# Patient Record
Sex: Female | Born: 1990
Health system: Southern US, Community
[De-identification: ages and names within clinical notes are randomized; demographics above are authoritative.]

## PROBLEM LIST (undated history)

## (undated) ENCOUNTER — Inpatient Hospital Stay (HOSPITAL_COMMUNITY): Payer: Self-pay

## (undated) DIAGNOSIS — L708 Other acne: Secondary | ICD-10-CM

## (undated) DIAGNOSIS — K8020 Calculus of gallbladder without cholecystitis without obstruction: Secondary | ICD-10-CM

## (undated) DIAGNOSIS — B999 Unspecified infectious disease: Secondary | ICD-10-CM

## (undated) DIAGNOSIS — G35 Multiple sclerosis: Secondary | ICD-10-CM

## (undated) DIAGNOSIS — J309 Allergic rhinitis, unspecified: Secondary | ICD-10-CM

## (undated) DIAGNOSIS — G43909 Migraine, unspecified, not intractable, without status migrainosus: Secondary | ICD-10-CM

## (undated) DIAGNOSIS — IMO0002 Reserved for concepts with insufficient information to code with codable children: Secondary | ICD-10-CM

## (undated) DIAGNOSIS — B977 Papillomavirus as the cause of diseases classified elsewhere: Secondary | ICD-10-CM

## (undated) DIAGNOSIS — E669 Obesity, unspecified: Secondary | ICD-10-CM

## (undated) DIAGNOSIS — F32A Depression, unspecified: Secondary | ICD-10-CM

## (undated) DIAGNOSIS — F329 Major depressive disorder, single episode, unspecified: Secondary | ICD-10-CM

## (undated) DIAGNOSIS — R87629 Unspecified abnormal cytological findings in specimens from vagina: Secondary | ICD-10-CM

## (undated) DIAGNOSIS — J45909 Unspecified asthma, uncomplicated: Secondary | ICD-10-CM

## (undated) HISTORY — DX: Papillomavirus as the cause of diseases classified elsewhere: B97.7

## (undated) HISTORY — DX: Unspecified asthma, uncomplicated: J45.909

## (undated) HISTORY — DX: Calculus of gallbladder without cholecystitis without obstruction: K80.20

## (undated) HISTORY — PX: CHOLECYSTECTOMY: SHX55

## (undated) HISTORY — DX: Migraine, unspecified, not intractable, without status migrainosus: G43.909

## (undated) HISTORY — DX: Other acne: L70.8

## (undated) HISTORY — DX: Allergic rhinitis, unspecified: J30.9

## (undated) HISTORY — DX: Depression, unspecified: F32.A

## (undated) HISTORY — DX: Reserved for concepts with insufficient information to code with codable children: IMO0002

## (undated) HISTORY — DX: Major depressive disorder, single episode, unspecified: F32.9

---
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
## 2010-03-09 NOTE — Miscellaneous (Signed)
   Clinical Lists Changes  Problems: Removed problem of COUGH (ICD-786.2) Removed problem of ACUTE ALCOHOLIC INTOXICATION IN REMISSION (ICD-303.03) Removed problem of DIZZINESS (ICD-780.4) Removed problem of TINEA CORPORIS (ICD-110.5) Removed problem of BACK PAIN, LOW (ICD-724.2) Changed problem from ASTHMA, UNSPECIFIED (ICD-493.90) to ASTHMA, INTERMITTENT (ICD-493.90)

## 2010-04-20 LAB — COMPREHENSIVE METABOLIC PANEL
ALT: 9 U/L (ref 0–35)
AST: 16 U/L (ref 0–37)
Alkaline Phosphatase: 64 U/L (ref 39–117)
CO2: 25 mEq/L (ref 19–32)
Chloride: 107 mEq/L (ref 96–112)
GFR calc Af Amer: >60 mL/min (ref >60)
GFR calc non Af Amer: >60 mL/min (ref >60)
Glucose, Bld: 79 mg/dL (ref 70–99)
Potassium: 3.8 mEq/L (ref 3.5–5.1)
Sodium: 141 mEq/L (ref 135–145)
Total Bilirubin: 0.5 mg/dL (ref 0.3–1.2)

## 2010-04-20 LAB — URINALYSIS, ROUTINE W REFLEX MICROSCOPIC
Bilirubin Urine: NEGATIVE
Hgb urine dipstick: NEGATIVE
Ketones, ur: NEGATIVE mg/dL
Nitrite: NEGATIVE
Specific Gravity, Urine: 1.028 (ref 1.005–1.030)
Urobilinogen, UA: 0.2 mg/dL (ref 0.0–1.0)
pH: 6 (ref 5.0–8.0)

## 2010-04-20 LAB — CBC
HCT: 34.8 % — ABNORMAL LOW (ref 36.0–46.0)
Hemoglobin: 12.1 g/dL (ref 12.0–15.0)
MCHC: 34.9 g/dL (ref 30.0–36.0)
RBC: 3.77 MIL/uL — ABNORMAL LOW (ref 3.87–5.11)
WBC: 4.5 10*3/uL (ref 4.0–10.5)

## 2010-04-20 LAB — DIFFERENTIAL
Basophils Relative: 1 % (ref 0–1)
Eosinophils Absolute: 0.2 10*3/uL (ref 0.0–0.7)
Eosinophils Relative: 5 % (ref 0–5)
Neutrophils Relative %: 50 % (ref 43–77)

## 2010-04-20 LAB — LIPASE, BLOOD: Lipase: 32 U/L (ref 23–300)

## 2010-04-20 LAB — PREGNANCY, URINE: Preg Test, Ur: NEGATIVE

## 2010-05-15 LAB — PREGNANCY, URINE: Preg Test, Ur: NEGATIVE

## 2010-05-15 LAB — BASIC METABOLIC PANEL
BUN: 12 mg/dL (ref 6–23)
CO2: 24 mEq/L (ref 19–32)
Glucose, Bld: 95 mg/dL (ref 70–99)
Potassium: 4 mEq/L (ref 3.5–5.1)
Sodium: 145 mEq/L (ref 135–145)

## 2010-05-15 LAB — CBC
HCT: 38.8 % (ref 36.0–49.0)
Hemoglobin: 12.9 g/dL (ref 12.0–16.0)
MCHC: 33.3 g/dL (ref 31.0–37.0)
Platelets: 233 10*3/uL (ref 150–400)
RDW: 12.7 % (ref 11.4–15.5)

## 2010-05-15 LAB — DIFFERENTIAL
Basophils Absolute: 0 10*3/uL (ref 0.0–0.1)
Basophils Relative: 1 % (ref 0–1)
Eosinophils Absolute: 0 10*3/uL (ref 0.0–1.2)
Eosinophils Relative: 1 % (ref 0–5)
Lymphocytes Relative: 15 % — ABNORMAL LOW (ref 24–48)
Monocytes Absolute: 0.6 10*3/uL (ref 0.2–1.2)

## 2010-12-15 ENCOUNTER — Encounter (HOSPITAL_BASED_OUTPATIENT_CLINIC_OR_DEPARTMENT_OTHER): Payer: Self-pay | Admitting: *Deleted

## 2010-12-15 ENCOUNTER — Emergency Department (HOSPITAL_BASED_OUTPATIENT_CLINIC_OR_DEPARTMENT_OTHER)
Admission: EM | Admit: 2010-12-15 | Discharge: 2010-12-16 | Disposition: A | Discharge status: Home / Self Care | Payer: 59 | Attending: Emergency Medicine | Admitting: Emergency Medicine

## 2010-12-15 DIAGNOSIS — J159 Unspecified bacterial pneumonia: Secondary | ICD-10-CM

## 2010-12-15 DIAGNOSIS — J45909 Unspecified asthma, uncomplicated: Secondary | ICD-10-CM | POA: Insufficient documentation

## 2010-12-15 DIAGNOSIS — R05 Cough: Secondary | ICD-10-CM | POA: Insufficient documentation

## 2010-12-15 DIAGNOSIS — R059 Cough, unspecified: Secondary | ICD-10-CM | POA: Insufficient documentation

## 2010-12-15 MED ORDER — ACETAMINOPHEN 325 MG PO TABS
650.0000 mg | ORAL_TABLET | Freq: Once | ORAL | Status: AC
  Administered 2010-12-15: 650 mg via ORAL
  Filled 2010-12-15: qty 2

## 2010-12-15 MED ORDER — ONDANSETRON HCL 4 MG/2ML IJ SOLN
4.0000 mg | Freq: Once | INTRAMUSCULAR | Status: DC
  Filled 2010-12-15: qty 2

## 2010-12-15 MED ORDER — SODIUM CHLORIDE 0.9 % IV SOLN: INTRAVENOUS | Status: DC

## 2010-12-15 MED ORDER — SODIUM CHLORIDE 0.9 % IV BOLUS (SEPSIS): 1000.0000 mL | Freq: Once | INTRAVENOUS | Status: DC

## 2010-12-15 NOTE — ED Notes (Signed)
Pt. Reports she has had a cough and fever today.  Pt. Is in no resp. Distress and has clear and diminished lung snds.

## 2010-12-15 NOTE — ED Notes (Signed)
Chills, fever, sore throat, ear pain and cough x 3 days. Vomiting today.

## 2010-12-16 ENCOUNTER — Emergency Department (INDEPENDENT_AMBULATORY_CARE_PROVIDER_SITE_OTHER): Payer: 59

## 2010-12-16 DIAGNOSIS — R059 Cough, unspecified: Secondary | ICD-10-CM

## 2010-12-16 DIAGNOSIS — R509 Fever, unspecified: Secondary | ICD-10-CM

## 2010-12-16 DIAGNOSIS — R05 Cough: Secondary | ICD-10-CM

## 2010-12-16 DIAGNOSIS — R112 Nausea with vomiting, unspecified: Secondary | ICD-10-CM

## 2010-12-16 LAB — CBC
MCH: 28.7 pg (ref 26.0–34.0)
MCHC: 33.4 g/dL (ref 30.0–36.0)
MCV: 85.9 fL (ref 78.0–100.0)
Platelets: 195 10*3/uL (ref 150–400)
RDW: 12.7 % (ref 11.5–15.5)

## 2010-12-16 LAB — BASIC METABOLIC PANEL
CO2: 22 mEq/L (ref 19–32)
Calcium: 9.4 mg/dL (ref 8.4–10.5)
Creatinine, Ser: 0.6 mg/dL (ref 0.50–1.10)
GFR calc Af Amer: >90 mL/min (ref >90)
GFR calc non Af Amer: >90 mL/min (ref >90)
Sodium: 135 mEq/L (ref 135–145)

## 2010-12-16 MED ORDER — AZITHROMYCIN 250 MG PO TABS: 250.0000 mg | ORAL_TABLET | Freq: Every day | ORAL | Status: AC

## 2010-12-16 MED ORDER — AZITHROMYCIN 250 MG PO TABS
500.0000 mg | ORAL_TABLET | Freq: Once | ORAL | Status: AC
  Administered 2010-12-16: 500 mg via ORAL
  Filled 2010-12-16: qty 2

## 2010-12-16 NOTE — ED Notes (Signed)
Pt. Reports she does not want the IV and IV fluids and does not want the nausea med at this time.  Zofran returned to the external ben and Pt. Was given oral fluids.

## 2010-12-16 NOTE — ED Notes (Signed)
Patient is resting comfortably. 

## 2010-12-16 NOTE — ED Notes (Signed)
Vital signs stable.  Temp orally is 99.4

## 2010-12-16 NOTE — ED Provider Notes (Signed)
History     CSN: [id number] Arrival date & time: 12/15/2010 11:37 PM   First MD Initiated Contact with Patient 12/15/10 2335      Chief Complaint  Patient presents with  . URI    (Consider location/radiation/quality/duration/timing/severity/associated sxs/prior treatment) Patient is a 20 y.o. female presenting with pharyngitis.  Sore Throat This is a new problem. The current episode started more than 2 days ago. The problem occurs constantly. The problem has been gradually worsening. Pertinent negatives include no chest pain, no abdominal pain, no headaches and no shortness of breath. The symptoms are aggravated by nothing. The symptoms are relieved by nothing. She has tried acetaminophen for the symptoms. The treatment provided mild relief.   Patient presents with the complaint of cough and sore throat for 3 days. Vomited once today. Currently not nauseated. Denies diarrhea. She has a history of strep throat in the past. Today is felt as if she had a fever. Also complaint of bilateral ear pain. No primary care provider.   Past Medical History  Diagnosis Date  . LGSIL (low grade squamous intraepithelial dysplasia) 2009  . 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 Asthma   . Gallstones 2011    Past Surgical History  Procedure Date  . Colposcopy 2009  . Cholecystectomy     Family History  Problem Relation Age of Onset  . Multiple sclerosis Mother   . Hypertension Mother   . Diabetes Father   . Breast cancer Maternal Grandmother     History  Substance Use Topics  . Smoking status: Passive Smoker  . Smokeless tobacco: Not on file  . Alcohol Use: No    OB History    Grav Para Term Preterm Abortions TAB SAB Ect Mult Living                  Review of Systems  Constitutional: Positive for fever and chills.  HENT: Positive for ear pain, congestion and sore throat. Negative for neck pain.   Respiratory: Positive for cough. Negative for shortness of breath.   Cardiovascular:  Negative for chest pain.  Gastrointestinal: Positive for nausea and vomiting. Negative for abdominal pain and diarrhea.  Genitourinary: Negative for dysuria and hematuria.  Musculoskeletal: Negative for myalgias and back pain.  Neurological: Negative for headaches.  Hematological: Does not bruise/bleed easily.  Psychiatric/Behavioral: Negative for confusion.    Allergies  Penicillins and Sulfonamide derivatives  Home Medications   Current Outpatient Rx  Name Route Sig Dispense Refill  . ALBUTEROL IN Inhalation Inhale into the lungs.      . DROSPIRENONE-ETHINYL ESTRADIOL 3-0.02 MG PO TABS Oral Take 1 tablet by mouth daily.        BP 131/86  Pulse 126  Temp(Src) 102.1 F (38.9 C) (Oral)  Resp 22  SpO2 99%  Physical Exam  Nursing note and vitals reviewed. Constitutional: She is oriented to person, place, and time. She appears well-developed and well-nourished. No distress.  HENT:  Head: Normocephalic and atraumatic.  Right Ear: External ear normal.  Left Ear: External ear normal.  Mouth/Throat: Oropharynx is clear and moist. No oropharyngeal exudate.       Pharyngeal erythema. No significant tonsillar enlargement no exudate.  Eyes: Conjunctivae and EOM are normal. Pupils are equal, round, and reactive to light.  Neck: Normal range of motion. Neck supple.  Cardiovascular: Normal rate, regular rhythm, normal heart sounds and intact distal pulses.   No murmur heard. Pulmonary/Chest: Effort normal and breath sounds normal. No respiratory distress. She  has no wheezes. She has no rales. She exhibits no tenderness.  Abdominal: Soft. Bowel sounds are normal. There is no tenderness.  Musculoskeletal: Normal range of motion. She exhibits no edema.  Lymphadenopathy:    She has no cervical adenopathy.  Neurological: She is alert and oriented to person, place, and time. No cranial nerve deficit. She exhibits normal muscle tone. Coordination normal.  Skin: Skin is warm and dry. No rash  noted. She is not diaphoretic.    ED Course  Procedures (including critical care time)  Labs Reviewed  BASIC METABOLIC PANEL - Abnormal; Notable for the following:    Glucose, Bld 106 (*)    All other components within normal limits  RAPID STREP SCREEN  CBC   Results for orders placed during the hospital encounter of 12/15/10  RAPID STREP SCREEN      Component Value Range   Streptococcus, Group A Screen (Direct) NEGATIVE  NEGATIVE   CBC      Component Value Range   WBC 5.4  4.0 - 10.5 (K/uL)   RBC 4.39  3.87 - 5.11 (MIL/uL)   Hemoglobin 12.6  12.0 - 15.0 (g/dL)   HCT 40.9  81.1 - 91.4 (%)   MCV 85.9  78.0 - 100.0 (fL)   MCH 28.7  26.0 - 34.0 (pg)   MCHC 33.4  30.0 - 36.0 (g/dL)   RDW 78.2  95.6 - 21.3 (%)   Platelets 195  150 - 400 (K/uL)  BASIC METABOLIC PANEL      Component Value Range   Sodium 135  135 - 145 (mEq/L)   Potassium 3.5  3.5 - 5.1 (mEq/L)   Chloride 102  96 - 112 (mEq/L)   CO2 22  19 - 32 (mEq/L)   Glucose, Bld 106 (*) 70 - 99 (mg/dL)   BUN 9  6 - 23 (mg/dL)   Creatinine, Ser 0.86  0.50 - 1.10 (mg/dL)   Calcium 9.4  8.4 - 57.8 (mg/dL)   GFR calc non Af Amer >90  >90 (mL/min)   GFR calc Af Amer >90  >90 (mL/min)   Dg Chest 2 View  12/16/2010  *RADIOLOGY REPORT*  Clinical Data: Cough, fever, chills, nausea and vomiting.  CHEST - 2 VIEW  Comparison: None.  Findings: The lungs are well-aerated.  Mild medial right basilar airspace opacity is less well characterized on the lateral view but could reflect mild pneumonia.  There is no evidence of pleural effusion or pneumothorax.  The heart is normal in size; the mediastinal contour is within normal limits.  No acute osseous abnormalities are seen.  IMPRESSION: Mild medial right basilar airspace opacity is less well characterized on the lateral view but could reflect mild pneumonia.  Original Report Authenticated By: Tonia Ghent, M.D.       MDM   Patient with upper respiratory flulike symptoms. Negative for  strep pharyngitis. Positive for early pneumonia this would be a community-acquired pneumonia. Patient is nontoxic and in no acute distress. Significant fever is present with temp of 102 upon presentation to the ED. Patient given first dose of Zithromax in the emergency department. Offered the patient IV hydration while here but she did not want that.   Impression:  Community-acquired pneumonia Pharyngitis     Shelda Jakes, MD 12/16/10 [phone]

## 2011-04-10 ENCOUNTER — Encounter (HOSPITAL_BASED_OUTPATIENT_CLINIC_OR_DEPARTMENT_OTHER): Payer: Self-pay | Admitting: *Deleted

## 2011-04-10 ENCOUNTER — Emergency Department (HOSPITAL_BASED_OUTPATIENT_CLINIC_OR_DEPARTMENT_OTHER)
Admission: EM | Admit: 2011-04-10 | Discharge: 2011-04-10 | Disposition: A | Discharge status: Home / Self Care | Payer: 59 | Attending: Emergency Medicine | Admitting: Emergency Medicine

## 2011-04-10 ENCOUNTER — Emergency Department (INDEPENDENT_AMBULATORY_CARE_PROVIDER_SITE_OTHER): Payer: 59

## 2011-04-10 DIAGNOSIS — Z9089 Acquired absence of other organs: Secondary | ICD-10-CM

## 2011-04-10 DIAGNOSIS — Z9889 Other specified postprocedural states: Secondary | ICD-10-CM | POA: Insufficient documentation

## 2011-04-10 DIAGNOSIS — R1013 Epigastric pain: Secondary | ICD-10-CM | POA: Insufficient documentation

## 2011-04-10 DIAGNOSIS — J45909 Unspecified asthma, uncomplicated: Secondary | ICD-10-CM | POA: Insufficient documentation

## 2011-04-10 DIAGNOSIS — R112 Nausea with vomiting, unspecified: Secondary | ICD-10-CM | POA: Insufficient documentation

## 2011-04-10 DIAGNOSIS — R197 Diarrhea, unspecified: Secondary | ICD-10-CM

## 2011-04-10 DIAGNOSIS — R10816 Epigastric abdominal tenderness: Secondary | ICD-10-CM | POA: Insufficient documentation

## 2011-04-10 LAB — COMPREHENSIVE METABOLIC PANEL
ALT: 55 U/L — ABNORMAL HIGH (ref 0–35)
Albumin: 3.9 g/dL (ref 3.5–5.2)
Calcium: 9.6 mg/dL (ref 8.4–10.5)
GFR calc Af Amer: >90 mL/min (ref >90)
Glucose, Bld: 98 mg/dL (ref 70–99)
Potassium: 4 mEq/L (ref 3.5–5.1)
Sodium: 138 mEq/L (ref 135–145)
Total Protein: 8 g/dL (ref 6.0–8.3)

## 2011-04-10 LAB — URINALYSIS, ROUTINE W REFLEX MICROSCOPIC
Bilirubin Urine: NEGATIVE
Ketones, ur: NEGATIVE mg/dL
Nitrite: NEGATIVE
Specific Gravity, Urine: 1.021 (ref 1.005–1.030)
Urobilinogen, UA: 1 mg/dL (ref 0.0–1.0)

## 2011-04-10 LAB — LIPASE, BLOOD: Lipase: 16 U/L (ref 11–59)

## 2011-04-10 MED ORDER — SODIUM CHLORIDE 0.9 % IV BOLUS (SEPSIS)
1000.0000 mL | Freq: Once | INTRAVENOUS | Status: AC
  Administered 2011-04-10: 1000 mL via INTRAVENOUS

## 2011-04-10 MED ORDER — OXYCODONE-ACETAMINOPHEN 5-325 MG PO TABS: 2.0000 | ORAL_TABLET | ORAL | Status: AC | PRN

## 2011-04-10 MED ORDER — ONDANSETRON HCL 4 MG PO TABS: 4.0000 mg | ORAL_TABLET | Freq: Four times a day (QID) | ORAL | Status: AC

## 2011-04-10 MED ORDER — ONDANSETRON 4 MG PO TBDP
4.0000 mg | ORAL_TABLET | Freq: Once | ORAL | Status: AC
  Administered 2011-04-10: 4 mg via ORAL
  Filled 2011-04-10: qty 1

## 2011-04-10 MED ORDER — KETOROLAC TROMETHAMINE 30 MG/ML IJ SOLN
30.0000 mg | Freq: Once | INTRAMUSCULAR | Status: AC
Start: 2011-04-10 — End: 2011-04-10
  Administered 2011-04-10: 30 mg via INTRAVENOUS
  Filled 2011-04-10: qty 1

## 2011-04-10 MED ORDER — ONDANSETRON HCL 4 MG/2ML IJ SOLN
4.0000 mg | Freq: Once | INTRAMUSCULAR | Status: AC
  Administered 2011-04-10: 4 mg via INTRAVENOUS
  Filled 2011-04-10: qty 2

## 2011-04-10 NOTE — ED Provider Notes (Signed)
History     CSN: [id number]  Arrival date & time 04/10/11  0745   First MD Initiated Contact with Patient 04/10/11 [phone]      Chief Complaint  Patient presents with  . Abdominal Pain  . Emesis   patient developed nausea and vomiting. Yesterday morning. Last night, she began having watery diarrhea.` The patient also has diffuse epigastric pain. She states the pain is sometimes worsened with bowel movements. She has had no significant right lower quadrant pain. She does have a previous history of cholecystectomy. Patient has had no sick contacts other than friends with upper respiratory infection). She's had no melena or hematochezia. She's had no hematemesis. No dizziness or syncope. Denies any urinary symptoms. Patient did not take any medications at home for her symptoms. Pain is worsened with movement or palpation. Somewhat relieved with vomiting and with lying still  (Consider location/radiation/quality/duration/timing/severity/associated sxs/prior treatment) HPI  Past Medical History  Diagnosis Date  . LGSIL (low grade squamous intraepithelial dysplasia) 2009  . 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 Asthma   . Gallstones 2011    Past Surgical History  Procedure Date  . Colposcopy 2009  . Cholecystectomy     Family History  Problem Relation Age of Onset  . Multiple sclerosis Mother   . Hypertension Mother   . Diabetes Father   . Breast cancer Maternal Grandmother     History  Substance Use Topics  . Smoking status: Passive Smoker  . Smokeless tobacco: Not on file  . Alcohol Use: No    OB History    Grav Para Term Preterm Abortions TAB SAB Ect Mult Living                  Review of Systems  All other systems reviewed and are negative.    Allergies  Penicillins and Sulfonamide derivatives  Home Medications   Current Outpatient Rx  Name Route Sig Dispense Refill  . ALBUTEROL IN Inhalation Inhale into the lungs.      . DROSPIRENONE-ETHINYL ESTRADIOL 3-0.02 MG  PO TABS Oral Take 1 tablet by mouth daily.        BP 125/87  Pulse 103  Temp(Src) 97.6 F (36.4 C) (Oral)  Resp 20  Ht 5\' 6"  (1.676 m)  SpO2 100%  Physical Exam  Nursing note and vitals reviewed. Constitutional: She is oriented to person, place, and time. She appears well-developed and well-nourished. No distress.  HENT:  Head: Normocephalic and atraumatic.  Eyes: Conjunctivae and EOM are normal. Pupils are equal, round, and reactive to light.  Neck: Neck supple.  Cardiovascular: Normal rate and regular rhythm.  Exam reveals no gallop and no friction rub.   No murmur heard. Pulmonary/Chest: Breath sounds normal. She has no wheezes. She has no rales. She exhibits no tenderness.  Abdominal: Soft. Bowel sounds are normal. She exhibits no distension. There is tenderness. There is no rebound and no guarding.       Mild diffuse epigastric tenderness. Also, mild tenderness to palpation diffusely.  Musculoskeletal: Normal range of motion.  Neurological: She is alert and oriented to person, place, and time. No cranial nerve deficit. Coordination normal.  Skin: Skin is warm and dry. No rash noted.  Psychiatric: She has a normal mood and affect.    ED Course  Procedures (including critical care time)  Labs Reviewed - No data to display No results found.   No diagnosis found.    MDM  Pt is seen and examined;  Initial  history and physical completed.  Will follow.    Vital signs are stable.  Will provide IV hydration, as well as check a comprehensive metabolic panel, urinalysis. Patient is given Zofran and Toradol for her symptoms. We'll reassess. At this time. I feel her symptoms are likely secondary to a viral stomach illness.     1:04 PM  Patient is reassessed. She is feeling better. Urinalysis, initial labs are reassuring. Ultrasound showed no specific abnormality. Offered to provide additional care, hydration, observation or additional antibiotics. However, patient states  she feels better. She wants to go home. Prescriptions and also requesting a work note. She was told to return to ED for any concerns     Theron Arista A. Patrica Duel, MD 04/10/11 1304

## 2011-04-10 NOTE — ED Notes (Signed)
Patient states she developed nausea and vomiting yesterday morning, last night she developed diarrhea.  C/O upper abdominal pain.

## 2011-05-31 ENCOUNTER — Emergency Department (INDEPENDENT_AMBULATORY_CARE_PROVIDER_SITE_OTHER): Payer: 59

## 2011-05-31 ENCOUNTER — Emergency Department (HOSPITAL_BASED_OUTPATIENT_CLINIC_OR_DEPARTMENT_OTHER)
Admission: EM | Admit: 2011-05-31 | Discharge: 2011-05-31 | Disposition: A | Discharge status: Home / Self Care | Payer: 59 | Attending: Emergency Medicine | Admitting: Emergency Medicine

## 2011-05-31 ENCOUNTER — Encounter (HOSPITAL_BASED_OUTPATIENT_CLINIC_OR_DEPARTMENT_OTHER): Payer: Self-pay | Admitting: Emergency Medicine

## 2011-05-31 DIAGNOSIS — S0993XA Unspecified injury of face, initial encounter: Secondary | ICD-10-CM | POA: Insufficient documentation

## 2011-05-31 DIAGNOSIS — S0990XA Unspecified injury of head, initial encounter: Secondary | ICD-10-CM

## 2011-05-31 DIAGNOSIS — Y92009 Unspecified place in unspecified non-institutional (private) residence as the place of occurrence of the external cause: Secondary | ICD-10-CM | POA: Insufficient documentation

## 2011-05-31 DIAGNOSIS — J45909 Unspecified asthma, uncomplicated: Secondary | ICD-10-CM | POA: Insufficient documentation

## 2011-05-31 DIAGNOSIS — W010XXA Fall on same level from slipping, tripping and stumbling without subsequent striking against object, initial encounter: Secondary | ICD-10-CM | POA: Insufficient documentation

## 2011-05-31 DIAGNOSIS — F17200 Nicotine dependence, unspecified, uncomplicated: Secondary | ICD-10-CM | POA: Insufficient documentation

## 2011-05-31 DIAGNOSIS — S199XXA Unspecified injury of neck, initial encounter: Secondary | ICD-10-CM | POA: Insufficient documentation

## 2011-05-31 DIAGNOSIS — R55 Syncope and collapse: Secondary | ICD-10-CM | POA: Insufficient documentation

## 2011-05-31 DIAGNOSIS — X58XXXA Exposure to other specified factors, initial encounter: Secondary | ICD-10-CM

## 2011-05-31 LAB — URINALYSIS, ROUTINE W REFLEX MICROSCOPIC
Bilirubin Urine: NEGATIVE
Glucose, UA: NEGATIVE mg/dL
Ketones, ur: NEGATIVE mg/dL
pH: 7 (ref 5.0–8.0)

## 2011-05-31 NOTE — ED Notes (Signed)
Taking shower, pt does not remember losing consiousness, but states that she woke up on floor, friend with patients states that it took about five minutes before she came to open door, because she had it locked. Pt does not remember falling, just waking up on floor of shower, denies any presyncope symptoms, denies any recent illnesses

## 2011-05-31 NOTE — ED Provider Notes (Addendum)
History     CSN: [id number]  Arrival date & time 05/31/11  0214   First MD Initiated Contact with Patient 05/31/11 0246      Chief Complaint  Patient presents with  . Head Injury    (Consider location/radiation/quality/duration/timing/severity/associated sxs/prior treatment) HPI This is a 21 year old white female who was taking a shower. She either slipped and fell or had a syncopal episode. All she recalls is that she was taking a shower and then found her self on the floor the shower. She is complaining of pain to the right side of her for head and maxillary region. The pain is mild to moderate, worse with palpation. She denies neck or back pain. She denies nausea or vomiting. She did have some transient vaguely described dizziness that has resolved.   Past Medical History  Diagnosis Date  . LGSIL (low grade squamous intraepithelial dysplasia) 2009  . 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 Asthma   . Gallstones 2011    Past Surgical History  Procedure Date  . Colposcopy 2009  . Cholecystectomy     Family History  Problem Relation Age of Onset  . Multiple sclerosis Mother   . Hypertension Mother   . Diabetes Father   . Breast cancer Maternal Grandmother     History  Substance Use Topics  . Smoking status: Current Everyday Smoker -- 0.5 packs/day    Types: Cigarettes  . Smokeless tobacco: Not on file  . Alcohol Use: No    OB History    Grav Para Term Preterm Abortions TAB SAB Ect Mult Living                  Review of Systems  All other systems reviewed and are negative.    Allergies  Eggs or egg-derived products; Penicillins; and Sulfonamide derivatives  Home Medications   Current Outpatient Rx  Name Route Sig Dispense Refill  . ALBUTEROL IN Inhalation Inhale into the lungs.      . DROSPIRENONE-ETHINYL ESTRADIOL 3-0.02 MG PO TABS Oral Take 1 tablet by mouth daily.       BP 108/68  Pulse 79  Temp(Src) 97.8 F (36.6 C) (Oral)  Resp 18  SpO2 100%   LMP 05/17/2011  Physical Exam General: Well-developed, well-nourished female in no acute distress; appearance consistent with age of record HENT: normocephalic, mild right forehead and maxillary tenderness without hematoma or ecchymosis Eyes: pupils equal round and reactive to light; extraocular muscles intact Neck: supple; nontender Heart: regular rate and rhythm Lungs: clear to auscultation bilaterally Abdomen: soft; nondistended; nontender Back: No spinal tenderness Extremities: No deformity; full range of motion Neurologic: Awake, alert and oriented; motor function intact in all extremities and symmetric; no facial droop Skin: Warm and dry Psychiatric: Flat affect    ED Course  Procedures (including critical care time)     MDM   Nursing notes and vitals signs, including pulse oximetry, reviewed.  Summary of this visit's results, reviewed by myself:  Labs:  Results for orders placed during the hospital encounter of 05/31/11  PREGNANCY, URINE      Component Value Range   Preg Test, Ur NEGATIVE  NEGATIVE   URINALYSIS, ROUTINE W REFLEX MICROSCOPIC      Component Value Range   Color, Urine YELLOW  YELLOW    APPearance CLEAR  CLEAR    Specific Gravity, Urine 1.025  1.005 - 1.030    pH 7.0  5.0 - 8.0    Glucose, UA NEGATIVE  NEGATIVE (mg/dL)  Hgb urine dipstick NEGATIVE  NEGATIVE    Bilirubin Urine NEGATIVE  NEGATIVE    Ketones, ur NEGATIVE  NEGATIVE (mg/dL)   Protein, ur NEGATIVE  NEGATIVE (mg/dL)   Urobilinogen, UA 0.2  0.0 - 1.0 (mg/dL)   Nitrite NEGATIVE  NEGATIVE    Leukocytes, UA NEGATIVE  NEGATIVE     Imaging Studies: Ct Head Wo Contrast  05/31/2011  *RADIOLOGY REPORT*  Clinical Data: Head injury to the right side of head.  Possible loss of consciousness.  CT HEAD WITHOUT CONTRAST  Technique:  Contiguous axial images were obtained from the base of the skull through the vertex without contrast.  Comparison: None.  Findings: Ventricles and sulci appear  symmetrical.  No mass effect or midline shift.  No ventricular dilatation.  No abnormal extra- axial fluid collections.  Gray-white matter junctions are distinct. Basal cisterns are not effaced.  No evidence of acute intracranial hemorrhage.  No depressed skull fractures.  Visualized paranasal sinuses and mastoid air cells are not opacified.  IMPRESSION: No acute intracranial abnormalities.  Original Report Authenticated By: Marlon Pel, M.D.      Date: 05/31/2011  Rate: 74Rhythm: normal sinus rhythm  QRS Axis: normal  Intervals: normal  ST/T Wave abnormalities: normal  Conduction Disutrbances: none  Narrative Interpretation: unremarkable  Comparison with prior EKG: Sinus arrhythmia noted previously  Patient was advised that it is unclear if this was a mechanical fall with concussion or syncope. She was advised to followup with her primary care physician for further evaluation.        Hanley Seamen, MD 05/31/11 0454  Hanley Seamen, MD 05/31/11 0981

## 2011-06-25 ENCOUNTER — Emergency Department (HOSPITAL_BASED_OUTPATIENT_CLINIC_OR_DEPARTMENT_OTHER)
Admission: EM | Admit: 2011-06-25 | Discharge: 2011-06-25 | Disposition: A | Discharge status: Home / Self Care | Payer: 59 | Attending: Emergency Medicine | Admitting: Emergency Medicine

## 2011-06-25 ENCOUNTER — Emergency Department (HOSPITAL_BASED_OUTPATIENT_CLINIC_OR_DEPARTMENT_OTHER): Payer: 59

## 2011-06-25 ENCOUNTER — Encounter (HOSPITAL_BASED_OUTPATIENT_CLINIC_OR_DEPARTMENT_OTHER): Payer: Self-pay | Admitting: *Deleted

## 2011-06-25 DIAGNOSIS — S20229A Contusion of unspecified back wall of thorax, initial encounter: Secondary | ICD-10-CM | POA: Insufficient documentation

## 2011-06-25 DIAGNOSIS — T148XXA Other injury of unspecified body region, initial encounter: Secondary | ICD-10-CM

## 2011-06-25 DIAGNOSIS — R209 Unspecified disturbances of skin sensation: Secondary | ICD-10-CM | POA: Insufficient documentation

## 2011-06-25 DIAGNOSIS — J45909 Unspecified asthma, uncomplicated: Secondary | ICD-10-CM | POA: Insufficient documentation

## 2011-06-25 DIAGNOSIS — W19XXXA Unspecified fall, initial encounter: Secondary | ICD-10-CM

## 2011-06-25 DIAGNOSIS — M546 Pain in thoracic spine: Secondary | ICD-10-CM | POA: Insufficient documentation

## 2011-06-25 DIAGNOSIS — W1809XA Striking against other object with subsequent fall, initial encounter: Secondary | ICD-10-CM | POA: Insufficient documentation

## 2011-06-25 DIAGNOSIS — M79609 Pain in unspecified limb: Secondary | ICD-10-CM | POA: Insufficient documentation

## 2011-06-25 MED ORDER — HYDROCODONE-ACETAMINOPHEN 5-325 MG PO TABS: 2.0000 | ORAL_TABLET | ORAL | Status: AC | PRN

## 2011-06-25 NOTE — ED Provider Notes (Signed)
History   This chart was scribed for Nelia Shi, MD scribed by Magnus Sinning. The patient was seen in room MH12/MH12 seen at 21:28.   CSN: [id number]  Arrival date & time 06/25/11  2050   First MD Initiated Contact with Patient 06/25/11 2114      Chief Complaint  Patient presents with  . Fall  . Back Pain    (Consider location/radiation/quality/duration/timing/severity/associated sxs/prior treatment) HPI Tamara Garza is a 21 y.o. female who presents to the Emergency Department complaining of constant moderate back pain with associated right foot numbness and pain. States all sxs are a result of a fall that occurred today. Patient says she fell backwards and landed on a tree root. Pt only notes hx of asthma. No modifying or aggravating factors.  Past Medical History  Diagnosis Date  . 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 Asthma   . Gallstones 2011  . LGSIL (low grade squamous intraepithelial dysplasia)     Past Surgical History  Procedure Date  . Colposcopy 2009  . Cholecystectomy     Family History  Problem Relation Age of Onset  . Multiple sclerosis Mother   . Hypertension Mother   . Diabetes Father   . Breast cancer Maternal Grandmother     History  Substance Use Topics  . Smoking status: Current Everyday Smoker -- 0.5 packs/day    Types: Cigarettes  . Smokeless tobacco: Not on file  . Alcohol Use: No   Review of Systems  All other systems reviewed and are negative.   10 Systems reviewed and are negative for acute change except as noted in the HPI. Allergies  Eggs or egg-derived products; Penicillins; and Sulfonamide derivatives  Home Medications   Current Outpatient Rx  Name Route Sig Dispense Refill  . ALBUTEROL SULFATE HFA 108 (90 BASE) MCG/ACT IN AERS Inhalation Inhale 2 puffs into the lungs every 6 (six) hours as needed.    . DROSPIRENONE-ETHINYL ESTRADIOL 3-0.02 MG PO TABS Oral Take 1 tablet by mouth daily.     Marland Kitchen HYDROCODONE-ACETAMINOPHEN 5-325  MG PO TABS Oral Take 2 tablets by mouth every 4 (four) hours as needed for pain. 15 tablet 0    BP 143/82  Pulse 110  Temp(Src) 98.6 F (37 C) (Oral)  Resp 18  Ht 5\' 6"  (1.676 m)  Wt 200 lb (90.719 kg)  BMI 32.28 kg/m2  SpO2 100%  LMP 06/15/2011  Physical Exam  Nursing note and vitals reviewed. Constitutional: She is oriented to person, place, and time. She appears well-developed and well-nourished. No distress.  HENT:  Head: Normocephalic and atraumatic.  Eyes: Pupils are equal, round, and reactive to light.  Neck: Normal range of motion.  Cardiovascular: Normal rate and intact distal pulses.   Pulmonary/Chest: No respiratory distress.  Abdominal: Normal appearance. She exhibits no distension.  Musculoskeletal: Normal range of motion.       Thoracic back: She exhibits pain.       Back:  Neurological: She is alert and oriented to person, place, and time. No cranial nerve deficit.  Skin: Skin is warm and dry. No rash noted.  Psychiatric: She has a normal mood and affect. Her behavior is normal.    ED Course  Procedures (including critical care time) DIAGNOSTIC STUDIES: Oxygen Saturation is 100% on room air, normal by my interpretation.    COORDINATION OF CARE:  Dg Thoracic Spine 2 View  06/25/2011  *RADIOLOGY REPORT*  Clinical Data: Status post fall, upper back and neck pain.  THORACIC SPINE - 2 VIEW  Comparison: 12/16/2010 chest radiograph  Findings: The imaged vertebral bodies and inter-vertebral disc spaces are maintained. No displaced acute fracture or dislocation identified.   The para-vertebral and overlying soft tissues are within normal limits.  Visualized portions of the lungs are clear. Surgical clips right upper quadrant.  IMPRESSION: No acute osseous abnormality of the thoracic spine.  Original Report Authenticated By: Waneta Martins, M.D.     1. Fall   2. Contusion       MDM  I personally performed the services described in this documentation, which  was scribed in my presence. The recorded information has been reviewed and considered.        Nelia Shi, MD 06/25/11 2218

## 2011-06-25 NOTE — ED Notes (Signed)
Pt was walking downhill on a muddy surface and fell backward, hitting back on tree root.

## 2011-06-25 NOTE — Discharge Instructions (Signed)
Contusion  A contusion is a deep bruise. Contusions happen when an injury causes bleeding under the skin. Signs of bruising include pain, puffiness (swelling), and discolored skin. The contusion may turn blue, purple, or yellow.  HOME CARE    Put ice on the injured area.   Put ice in a plastic bag.   Place a towel between your skin and the bag.   Leave the ice on for 15 to 20 minutes, 3 to 4 times a day.   Only take medicine as told by your doctor.   Rest the injured area.   If possible, raise (elevate) the injured area to lessen puffiness.  GET HELP RIGHT AWAY IF:    You have more bruising or puffiness.   You have pain that is getting worse.   Your puffiness or pain is not helped by medicine.  MAKE SURE YOU:    Understand these instructions.   Will watch your condition.   Will get help right away if you are not doing well or get worse.  Document Released: 07/11/2007 Document Revised: 01/11/2011 Document Reviewed: 11/27/2010  [employer] Patient Information 2012 [employer]

## 2011-07-09 ENCOUNTER — Emergency Department (HOSPITAL_COMMUNITY)
Admission: EM | Admit: 2011-07-09 | Discharge: 2011-07-09 | Disposition: A | Discharge status: Home / Self Care | Payer: 59 | Attending: Emergency Medicine | Admitting: Emergency Medicine

## 2011-07-09 ENCOUNTER — Encounter (HOSPITAL_COMMUNITY): Payer: Self-pay | Admitting: *Deleted

## 2011-07-09 DIAGNOSIS — G5600 Carpal tunnel syndrome, unspecified upper limb: Secondary | ICD-10-CM

## 2011-07-09 DIAGNOSIS — Z91012 Allergy to eggs: Secondary | ICD-10-CM | POA: Insufficient documentation

## 2011-07-09 DIAGNOSIS — F17200 Nicotine dependence, unspecified, uncomplicated: Secondary | ICD-10-CM | POA: Insufficient documentation

## 2011-07-09 DIAGNOSIS — J45909 Unspecified asthma, uncomplicated: Secondary | ICD-10-CM | POA: Insufficient documentation

## 2011-07-09 DIAGNOSIS — Z882 Allergy status to sulfonamides status: Secondary | ICD-10-CM | POA: Insufficient documentation

## 2011-07-09 DIAGNOSIS — Z880 Allergy status to penicillin: Secondary | ICD-10-CM | POA: Insufficient documentation

## 2011-07-09 MED ORDER — HYDROCODONE-ACETAMINOPHEN 5-500 MG PO TABS: 1.0000 | ORAL_TABLET | Freq: Four times a day (QID) | ORAL | Status: AC | PRN

## 2011-07-09 MED ORDER — IBUPROFEN 600 MG PO TABS: 600.0000 mg | ORAL_TABLET | Freq: Four times a day (QID) | ORAL | Status: AC | PRN

## 2011-07-09 NOTE — Discharge Instructions (Signed)
Carpal Tunnel Syndrome The carpal tunnel is a narrow hollow area in the wrist. It is formed by the wrist bones and ligaments. Nerves, blood vessels, and tendons (cord like structures which attach muscle to bone) on the palm side (the side of your hand in the direction your fingers bend) of your hand pass through the carpal tunnel. Repeated wrist motion or certain diseases may cause swelling within the tunnel. (That is why these are called repetitive trauma (damage caused by over use) disorders. It is also a common problem in late pregnancy.) This swelling pinches the main nerve in the wrist (median nerve) and causes the painful condition called carpal tunnel syndrome. A feeling of "pins and needles" may be noticed in the fingers or hand; however, the entire arm may ache from this condition. Carpal tunnel syndrome may clear up by itself. Cortisone injections may help. Sometimes, an operation may be needed to free the pinched nerve. An electromyogram (a type of test) may be needed to confirm this diagnosis (learning what is wrong). This is a test which measures nerve conduction. The nerve conduction is usually slowed in a carpal tunnel syndrome. HOME CARE INSTRUCTIONS   If your caregiver prescribed medication to help reduce swelling, take as directed.   If you were given a splint to keep your wrist from bending, use it as instructed. It is important to wear the splint at night. Use the splint for as long as you have pain or numbness in your hand, arm or wrist. This may take 1 to 2 months.   If you have pain at night, it may help to rub or shake your hand, or elevate your hand above the level of your heart (the center of your chest).   It is important to give your wrist a rest by stopping the activities that are causing the problem. If your symptoms (problems) are work-related, you may need to talk to your employer about changing to a job that does not require using your wrist.   Only take over-the-counter  or prescription medicines for pain, discomfort, or fever as directed by your caregiver.   Following periods of extended use, particularly strenuous use, apply an ice pack wrapped in a towel to the anterior (palm) side of the affected wrist for 20 to 30 minutes. Repeat as needed three to four times per day. This will help reduce the swelling.   Follow all instructions for follow-up with your caregiver. This includes any orthopedic referrals, physical therapy, and rehabilitation. Any delay in obtaining necessary care could result in a delay or failure of your condition to heal.  SEEK IMMEDIATE MEDICAL CARE IF:   You are still having pain and numbness following a week of treatment.   You develop new, unexplained symptoms.   Your current symptoms are getting worse and are not helped or controlled with medications.  MAKE SURE YOU:   Understand these instructions.   Will watch your condition.   Will get help right away if you are not doing well or get worse.  Document Released: 01/20/2000 Document Revised: 01/11/2011 Document Reviewed: 12/08/2010 [facility] Patient Information 2012 [place], [place].Carpal Tunnel Release Carpal tunnel release is done to relieve the pressure on the nerves and tendons on the bottom side of your wrist.  LET YOUR CAREGIVER KNOW ABOUT:   Allergies to food or medicine.   Medicines taken, including vitamins, herbs, eyedrops, over-the-counter medicines, and creams.   Use of steroids (by mouth or creams).   Previous problems with anesthetics or  numbing medicines.   History of bleeding problems or blood clots.   Previous surgery.   Other health problems, including diabetes and kidney problems.   Possibility of pregnancy, if this applies.  RISKS AND COMPLICATIONS  Some problems that may happen after this procedure include:  Infection.   Damage to the nerves, arteries or tendons could occur. This would be very uncommon.   Bleeding.  BEFORE THE PROCEDURE    This surgery may be done while you are asleep (general anesthetic) or may be done under a block where only your forearm and the surgical area is numb.   If the surgery is done under a block, the numbness will gradually wear off within several hours after surgery.  HOME CARE INSTRUCTIONS   Have a responsible person with you for 24 hours.   Do not drive a car or use public transportation for 24 hours.   Only take over-the-counter or prescription medicines for pain, discomfort, or fever as directed by your caregiver. Take them as directed.   You may put ice on the palm side of the affected wrist.   Put ice in a plastic bag.   Place a towel between your skin and the bag.   Leave the ice on for 20 to 30 minutes, 4 times per day.   If you were given a splint to keep your wrist from bending, use it as directed. It is important to wear the splint at night or as directed. Use the splint for as long as you have pain or numbness in your hand, arm, or wrist. This may take 1 to 2 months.   Keep your hand raised (elevated) above the level of your heart as much as possible. This keeps swelling down and helps with discomfort.   Change bandages (dressings) as directed.   Keep the wound clean and dry.  SEEK MEDICAL CARE IF:   You develop pain not relieved with medications.   You develop numbness of your hand.   You develop bleeding from your surgical site.   You have an oral temperature above 102 F (38.9 C).   You develop redness or swelling of the surgical site.   You develop new, unexplained problems.  SEEK IMMEDIATE MEDICAL CARE IF:   You develop a rash.   You have difficulty breathing.   You develop any reaction or side effects to medications given.  Document Released: 04/14/2003 Document Revised: 01/11/2011 Document Reviewed: 11/28/2006 [facility] Patient Information 2012 [place], [place].

## 2011-07-09 NOTE — ED Provider Notes (Signed)
History     CSN: [id number]  Arrival date & time 07/09/11  1802   First MD Initiated Contact with Patient 07/09/11 1828      No chief complaint on file.   (Consider location/radiation/quality/duration/timing/severity/associated sxs/prior treatment) HPI  The emergency department with complaints of left hand and wrist tingling and pain. She states that it started two weeks and ago and has been waxing and waining. She describes the pain as achy, sometimes sharp, sometimes tingling and numbness sensation. No associated trauma, injury, or bite. Pain stops just before elbow. VSS and NAD.  Past Medical History  Diagnosis Date  . 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 Asthma   . Gallstones 2011  . LGSIL (low grade squamous intraepithelial dysplasia)     Past Surgical History  Procedure Date  . Colposcopy 2009  . Cholecystectomy     Family History  Problem Relation Age of Onset  . Multiple sclerosis Mother   . Hypertension Mother   . Diabetes Father   . Breast cancer Maternal Grandmother     History  Substance Use Topics  . Smoking status: Current Everyday Smoker -- 0.5 packs/day    Types: Cigarettes  . Smokeless tobacco: Not on file  . Alcohol Use: No    OB History    Grav Para Term Preterm Abortions TAB SAB Ect Mult Living                  Review of Systems   HEENT: denies blurry vision or change in hearing PULMONARY: Denies difficulty breathing and SOB CARDIAC: denies chest pain or heart palpitations MUSCULOSKELETAL:  denies being unable to ambulate ABDOMEN AL: denies abdominal pain GU: denies loss of bowel or urinary control NEURO: denies numbness and tingling in extremities      Allergies  Eggs or egg-derived products; Sulfonamide derivatives; and Penicillins  Home Medications   Current Outpatient Rx  Name Route Sig Dispense Refill  . ALBUTEROL SULFATE HFA 108 (90 BASE) MCG/ACT IN AERS Inhalation Inhale 2 puffs into the lungs every 6 (six) hours as  needed.    Marland Kitchen HYDROCODONE-ACETAMINOPHEN 5-500 MG PO TABS Oral Take 1 tablet by mouth every 6 (six) hours as needed for pain. 12 tablet 0  . IBUPROFEN 600 MG PO TABS Oral Take 1 tablet (600 mg total) by mouth every 6 (six) hours as needed for pain. 30 tablet 0    LMP 06/11/2011  Physical Exam  Nursing note and vitals reviewed. Constitutional: She appears well-developed and well-nourished. No distress.  HENT:  Head: Normocephalic and atraumatic.  Eyes: Pupils are equal, round, and reactive to light.  Neck: Normal range of motion. Neck supple.  Cardiovascular: Normal rate and regular rhythm.   Pulmonary/Chest: Effort normal.  Abdominal: Soft.  Musculoskeletal:       Left hand: She exhibits decreased range of motion (due to pain) and tenderness. She exhibits no bony tenderness, normal two-point discrimination, normal capillary refill, no deformity, no laceration and no swelling. normal sensation noted. Normal strength noted.       Positive Tinel's sign  Normal color, temperature, skin moist.    Neurological: She is alert.  Skin: Skin is warm and dry.    ED Course  Procedures (including critical care time)  Labs Reviewed - No data to display No results found.   1. Carpal tunnel syndrome       MDM  Patient given wrist splint, Rx for Ibuprofen and Vicodin.  Gave extensive patient education about Carpal tunnel. Pt  given hand referral.  Pt has been advised of the symptoms that warrant their return to the ED. Patient has voiced understanding and has agreed to follow-up with the PCP or specialist.        Dorthula Matas, PA 07/09/11 1921

## 2011-07-09 NOTE — ED Notes (Signed)
Pt reports numbness and pain that radiates from hand to elbow. 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

## 2011-07-10 NOTE — ED Provider Notes (Signed)
Medical screening examination/treatment/procedure(s) were performed by non-physician practitioner and as supervising physician I was immediately available for consultation/collaboration.   Lyanne Co, MD 07/10/11 [phone]

## 2011-08-30 ENCOUNTER — Emergency Department (HOSPITAL_COMMUNITY)
Admission: EM | Admit: 2011-08-30 | Discharge: 2011-08-30 | Disposition: A | Discharge status: Home / Self Care | Payer: 59 | Attending: Emergency Medicine | Admitting: Emergency Medicine

## 2011-08-30 ENCOUNTER — Encounter (HOSPITAL_COMMUNITY): Payer: Self-pay | Admitting: Emergency Medicine

## 2011-08-30 DIAGNOSIS — F17200 Nicotine dependence, unspecified, uncomplicated: Secondary | ICD-10-CM | POA: Insufficient documentation

## 2011-08-30 DIAGNOSIS — M62838 Other muscle spasm: Secondary | ICD-10-CM | POA: Insufficient documentation

## 2011-08-30 DIAGNOSIS — J45909 Unspecified asthma, uncomplicated: Secondary | ICD-10-CM | POA: Insufficient documentation

## 2011-08-30 DIAGNOSIS — M549 Dorsalgia, unspecified: Secondary | ICD-10-CM

## 2011-08-30 MED ORDER — CYCLOBENZAPRINE HCL 10 MG PO TABS: 10.0000 mg | ORAL_TABLET | Freq: Three times a day (TID) | ORAL | Status: AC | PRN

## 2011-08-30 MED ORDER — HYDROCODONE-ACETAMINOPHEN 5-325 MG PO TABS: 1.0000 | ORAL_TABLET | ORAL | Status: AC | PRN

## 2011-08-30 MED ORDER — OXYCODONE-ACETAMINOPHEN 5-325 MG PO TABS
2.0000 | ORAL_TABLET | Freq: Once | ORAL | Status: AC
  Administered 2011-08-30: 2 via ORAL
  Filled 2011-08-30: qty 2

## 2011-08-30 NOTE — ED Notes (Signed)
Pt alert, nad, arrives from home, c/o back pain, onset was several weeks ago, dx with contusion, resp even unlabored, skin pwd, ambulates to triage

## 2011-08-30 NOTE — ED Provider Notes (Signed)
History     CSN: [id number]  Arrival date & time 08/30/11  0309   First MD Initiated Contact with Patient 08/30/11 [phone]      Chief Complaint  Patient presents with  . Back Pain   HPI  History provided by the patient. Patient is a 21 year old female with no significant past medical history who presents with complaints of upper back pain for the past 2 months. Patient reports falling on her back and landing on a hard route while walking down a hill several months ago. Patient was seen in emergency room at that time and had normal x-rays of her back and neck. Since that time she complains of waxing waning pains that seem to be a much worse last evening. Pain radiates some to right upper extremity. She denies any numbness or weakness with symptoms. She does report slight tingling to the tips of fingers. Patient has not been taking any medications recently for her symptoms. She denies having any neck pains.    Past Medical History  Diagnosis Date  . 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 Asthma   . Gallstones 2011  . LGSIL (low grade squamous intraepithelial dysplasia)     Past Surgical History  Procedure Date  . Colposcopy 2009  . Cholecystectomy     Family History  Problem Relation Age of Onset  . Multiple sclerosis Mother   . Hypertension Mother   . Diabetes Father   . Breast cancer Maternal Grandmother     History  Substance Use Topics  . Smoking status: Current Everyday Smoker -- 0.5 packs/day    Types: Cigarettes  . Smokeless tobacco: Not on file  . Alcohol Use: No    OB History    Grav Para Term Preterm Abortions TAB SAB Ect Mult Living                  Review of Systems  Constitutional: Negative for fever, chills and unexpected weight change.  Respiratory: Negative for shortness of breath.     Allergies  Eggs or egg-derived products; Sulfonamide derivatives; and Penicillins  Home Medications   Current Outpatient Rx  Name Route Sig Dispense Refill  .  ACETAMINOPHEN 500 MG PO TABS Oral Take 1,000 mg by mouth every 6 (six) hours as needed. Back pain    . ALBUTEROL SULFATE HFA 108 (90 BASE) MCG/ACT IN AERS Inhalation Inhale 2 puffs into the lungs every 6 (six) hours as needed.      BP 124/75  Pulse 70  Temp 98 F (36.7 C)  Resp 16  SpO2 99%  LMP 08/30/2011  Physical Exam  Nursing note and vitals reviewed. Constitutional: She is oriented to person, place, and time. She appears well-developed and well-nourished. No distress.  HENT:  Head: Normocephalic.  Neck: Normal range of motion. Neck supple.       No cervical midline tenderness  Cardiovascular: Normal rate and regular rhythm.   Pulmonary/Chest: Effort normal and breath sounds normal. No respiratory distress. She has no wheezes. She has no rales.  Abdominal: Soft. There is no tenderness.  Musculoskeletal:       Thoracic back: She exhibits tenderness.       Back:  Neurological: She is alert and oriented to person, place, and time. She has normal strength. No sensory deficit. Gait normal.  Skin: Skin is warm and dry.  Psychiatric: She has a normal mood and affect. Her behavior is normal.    ED Course  Procedures  1. Muscle spasm   2. Back pain       MDM  Patient seen and evaluated. Patient no acute distress. Patient with no red flag symptoms for back pains. Patient had unremarkable x-rays 2 months ago. No new injuries.        Angus Seller, [place] 08/30/11 [phone]

## 2011-08-30 NOTE — ED Provider Notes (Signed)
Medical screening examination/treatment/procedure(s) were performed by non-physician practitioner and as supervising physician I was immediately available for consultation/collaboration.   Carlisle Beers Morene Cecilio, MD 08/30/11 2250

## 2011-10-04 ENCOUNTER — Emergency Department (HOSPITAL_COMMUNITY): Payer: 59

## 2011-10-04 ENCOUNTER — Emergency Department (HOSPITAL_COMMUNITY)
Admission: EM | Admit: 2011-10-04 | Discharge: 2011-10-04 | Disposition: A | Discharge status: Home / Self Care | Payer: 59 | Attending: Emergency Medicine | Admitting: Emergency Medicine

## 2011-10-04 ENCOUNTER — Encounter (HOSPITAL_COMMUNITY): Payer: Self-pay

## 2011-10-04 DIAGNOSIS — J069 Acute upper respiratory infection, unspecified: Secondary | ICD-10-CM

## 2011-10-04 DIAGNOSIS — F17200 Nicotine dependence, unspecified, uncomplicated: Secondary | ICD-10-CM | POA: Insufficient documentation

## 2011-10-04 DIAGNOSIS — J45901 Unspecified asthma with (acute) exacerbation: Secondary | ICD-10-CM

## 2011-10-04 DIAGNOSIS — R0602 Shortness of breath: Secondary | ICD-10-CM | POA: Insufficient documentation

## 2011-10-04 MED ORDER — HYDROCODONE-ACETAMINOPHEN 5-325 MG PO TABS
1.0000 | ORAL_TABLET | Freq: Once | ORAL | Status: AC
  Administered 2011-10-04: 1 via ORAL
  Filled 2011-10-04: qty 1

## 2011-10-04 MED ORDER — PREDNISONE 20 MG PO TABS: 60.0000 mg | ORAL_TABLET | Freq: Every day | ORAL | Status: AC

## 2011-10-04 MED ORDER — ALBUTEROL SULFATE (5 MG/ML) 0.5% IN NEBU
2.5000 mg | INHALATION_SOLUTION | Freq: Once | RESPIRATORY_TRACT | Status: AC
  Administered 2011-10-04: 2.5 mg via RESPIRATORY_TRACT
  Filled 2011-10-04: qty 0.5

## 2011-10-04 MED ORDER — ALBUTEROL SULFATE HFA 108 (90 BASE) MCG/ACT IN AERS: 1.0000 | INHALATION_SPRAY | Freq: Four times a day (QID) | RESPIRATORY_TRACT | Status: DC | PRN

## 2011-10-04 NOTE — ED Provider Notes (Signed)
History     CSN: [id number]  Arrival date & time 10/04/11  4782   First MD Initiated Contact with Patient 10/04/11 1929      Chief Complaint  Patient presents with  . Shortness of Breath     HPI Patient presents to the emergency room with complaints of shortness of breath and wheezing. She has a history of asthma but states she only has attacks very sporadically so she does not take any medications for this on a daily basis. She has had some sore throat recently as well as nasal congestion and cough. This evening she started to feel significant amount of difficulty breathing so she came to the emergency room. Patient is having pain in her chest that is sharp and increases with breathing. Earlier she did feel lightheaded and dizzy as well. She has not had any fevers, vomiting or diarrhea Past Medical History  Diagnosis Date  . 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 Asthma   . Gallstones 2011  . LGSIL (low grade squamous intraepithelial dysplasia)     Past Surgical History  Procedure Date  . Colposcopy 2009  . Cholecystectomy     Family History  Problem Relation Age of Onset  . Multiple sclerosis Mother   . Hypertension Mother   . Diabetes Father   . Breast cancer Maternal Grandmother     History  Substance Use Topics  . Smoking status: Current Everyday Smoker -- 0.5 packs/day    Types: Cigarettes  . Smokeless tobacco: Not on file  . Alcohol Use: No    OB History    Grav Para Term Preterm Abortions TAB SAB Ect Mult Living                  Review of Systems  All other systems reviewed and are negative.    Allergies  Eggs or egg-derived products; Penicillins; and Sulfonamide derivatives  Home Medications   Current Outpatient Rx  Name Route Sig Dispense Refill  . HYDROCODONE-ACETAMINOPHEN 5-325 MG PO TABS Oral Take 0.5 tablets by mouth every 6 (six) hours as needed. Back pain      BP 165/91  Pulse 111  Temp 99.3 F (37.4 C) (Oral)  Resp 15  SpO2  98%  Physical Exam  Nursing note and vitals reviewed. Constitutional: She appears well-developed and well-nourished. No distress.  HENT:  Head: Normocephalic and atraumatic.  Right Ear: External ear normal.  Left Ear: External ear normal.  Mouth/Throat: No oropharyngeal exudate (mild erythema).  Eyes: Conjunctivae are normal. Right eye exhibits no discharge. Left eye exhibits no discharge. No scleral icterus.  Neck: Neck supple. No tracheal deviation present.  Cardiovascular: Normal rate, regular rhythm and intact distal pulses.   Pulmonary/Chest: Effort normal. No stridor. No respiratory distress. She has no wheezes (no wheezing after breathing treatment in the ED). She has no rales.  Abdominal: Soft. Bowel sounds are normal. She exhibits no distension. There is no tenderness. There is no rebound and no guarding.  Musculoskeletal: She exhibits no edema and no tenderness.  Neurological: She is alert. She has normal strength. No sensory deficit. Cranial nerve deficit:  no gross defecits noted. She exhibits normal muscle tone. She displays no seizure activity. Coordination normal.  Skin: Skin is warm and dry. No rash noted.  Psychiatric: She has a normal mood and affect.    ED Course  Procedures (including critical care time)   Labs Reviewed  RAPID STREP SCREEN   Dg Chest 2 View  10/04/2011  *  RADIOLOGY REPORT*  Clinical Data: Cough.  Asthma.  CHEST - 2 VIEW  Comparison: Plain films of the chest 12/16/2010.  Findings: Lungs are clear.  Heart size is normal.  No pneumothorax or pleural fluid.  No focal bony abnormality.  IMPRESSION: No acute disease.   Original Report Authenticated By: Bernadene Bell. D'ALESSIO, M.D.       MDM  Patient most likely has a viral upper respiratory infection triggering her asthma attack. She has no evidence of wheezing in the emergency department after her one treatment. I will discharge her home on an inhaler. We'll give her a short course of steroids. I  recommended to followup with primary care Dr. for further treatment. I did recommend that she discontinue smoking cigarettes        Celene Kras, MD 10/04/11 2144

## 2011-10-04 NOTE — ED Notes (Signed)
MD at bedside. 

## 2011-10-04 NOTE — ED Notes (Signed)
C/o sob, hx of asthma, sat 100% RA, HR 111, wheezing breath sound noted

## 2011-11-10 ENCOUNTER — Encounter: Payer: Self-pay | Admitting: Internal Medicine

## 2011-11-10 DIAGNOSIS — B977 Papillomavirus as the cause of diseases classified elsewhere: Secondary | ICD-10-CM | POA: Insufficient documentation

## 2011-11-10 DIAGNOSIS — R87612 Low grade squamous intraepithelial lesion on cytologic smear of cervix (LGSIL): Secondary | ICD-10-CM | POA: Insufficient documentation

## 2011-11-10 DIAGNOSIS — Z3689 Encounter for other specified antenatal screening: Secondary | ICD-10-CM | POA: Insufficient documentation

## 2011-11-10 DIAGNOSIS — K8020 Calculus of gallbladder without cholecystitis without obstruction: Secondary | ICD-10-CM | POA: Insufficient documentation

## 2011-11-21 ENCOUNTER — Ambulatory Visit (INDEPENDENT_AMBULATORY_CARE_PROVIDER_SITE_OTHER)
Admission: RE | Admit: 2011-11-21 | Discharge: 2011-11-21 | Disposition: A | Discharge status: Home / Self Care | Payer: 59 | Source: Ambulatory Visit | Attending: Internal Medicine | Admitting: Internal Medicine

## 2011-11-21 ENCOUNTER — Encounter: Payer: Self-pay | Admitting: Internal Medicine

## 2011-11-21 ENCOUNTER — Other Ambulatory Visit (INDEPENDENT_AMBULATORY_CARE_PROVIDER_SITE_OTHER): Payer: 59

## 2011-11-21 ENCOUNTER — Ambulatory Visit (INDEPENDENT_AMBULATORY_CARE_PROVIDER_SITE_OTHER): Payer: 59 | Admitting: Internal Medicine

## 2011-11-21 VITALS — BP 94/60 | HR 75 | Temp 98.0°F | Ht 66.0 in | Wt 227.4 lb

## 2011-11-21 DIAGNOSIS — G5602 Carpal tunnel syndrome, left upper limb: Secondary | ICD-10-CM

## 2011-11-21 DIAGNOSIS — Z0000 Encounter for general adult medical examination without abnormal findings: Secondary | ICD-10-CM

## 2011-11-21 DIAGNOSIS — G5600 Carpal tunnel syndrome, unspecified upper limb: Secondary | ICD-10-CM

## 2011-11-21 DIAGNOSIS — M549 Dorsalgia, unspecified: Secondary | ICD-10-CM

## 2011-11-21 DIAGNOSIS — N6320 Unspecified lump in the left breast, unspecified quadrant: Secondary | ICD-10-CM | POA: Insufficient documentation

## 2011-11-21 DIAGNOSIS — G43909 Migraine, unspecified, not intractable, without status migrainosus: Secondary | ICD-10-CM | POA: Insufficient documentation

## 2011-11-21 DIAGNOSIS — N63 Unspecified lump in unspecified breast: Secondary | ICD-10-CM

## 2011-11-21 HISTORY — DX: Migraine, unspecified, not intractable, without status migrainosus: G43.909

## 2011-11-21 LAB — CBC WITH DIFFERENTIAL/PLATELET
Basophils Absolute: 0 10*3/uL (ref 0.0–0.1)
Eosinophils Absolute: 0.3 10*3/uL (ref 0.0–0.7)
HCT: 37.6 % (ref 36.0–46.0)
Hemoglobin: 12.3 g/dL (ref 12.0–15.0)
Lymphocytes Relative: 32 % (ref 12.0–46.0)
Lymphs Abs: 1.7 10*3/uL (ref 0.7–4.0)
MCHC: 32.7 g/dL (ref 30.0–36.0)
Neutro Abs: 2.9 10*3/uL (ref 1.4–7.7)
Platelets: 223 10*3/uL (ref 150.0–400.0)
RDW: 13.9 % (ref 11.5–14.6)

## 2011-11-21 LAB — HEPATIC FUNCTION PANEL
AST: 16 U/L (ref 0–37)
Alkaline Phosphatase: 68 U/L (ref 39–117)
Bilirubin, Direct: 0.1 mg/dL (ref 0.0–0.3)
Total Protein: 7.3 g/dL (ref 6.0–8.3)

## 2011-11-21 LAB — LIPID PANEL
HDL: 48.5 mg/dL (ref >39.00)
Total CHOL/HDL Ratio: 3

## 2011-11-21 LAB — BASIC METABOLIC PANEL
CO2: 26 mEq/L (ref 19–32)
Calcium: 9.2 mg/dL (ref 8.4–10.5)
Potassium: 4.2 mEq/L (ref 3.5–5.1)
Sodium: 138 mEq/L (ref 135–145)

## 2011-11-21 LAB — TSH: TSH: 0.7 u[IU]/mL (ref 0.35–5.50)

## 2011-11-21 MED ORDER — ALBUTEROL SULFATE HFA 108 (90 BASE) MCG/ACT IN AERS: 1.0000 | INHALATION_SPRAY | Freq: Four times a day (QID) | RESPIRATORY_TRACT | Status: DC | PRN

## 2011-11-21 MED ORDER — NABUMETONE 500 MG PO TABS: 500.0000 mg | ORAL_TABLET | Freq: Two times a day (BID) | ORAL | Status: DC | PRN

## 2011-11-21 NOTE — Patient Instructions (Addendum)
Take all new medications as prescribed   - the pain medicine Continue all other medications as before - your inhaler was refilled as well Please have the pharmacy call with any other refills you may need. Please go to XRAY in the Basement for the x-ray test Please go to LAB in the Basement for the blood and/or urine tests to be done today You will be contacted by phone if any changes need to be made immediately.  Otherwise, you will receive a letter about your results with an explanation. Thank you for enrolling in MyChart. Please follow the instructions below to securely access your online medical record. MyChart allows you to send messages to your doctor, view your test results, renew your prescriptions, schedule appointments, and more. You will be contacted regarding the referral for: hand surgury for the left wrist, and the Diagnostic mammogram Please keep your appointments with your specialists as you have planned - your yearly exam with GYN Please return in 1 year for your yearly visit, or sooner if needed

## 2011-11-21 NOTE — Progress Notes (Signed)
Subjective:    Patient ID: Tamara Garza, female    DOB: 1990-10-17, 20 y.o.   MRN: [id number]    HPI  Here for wellness and to establish as new pt;  Overall doing ok;  Pt denies CP, worsening SOB, DOE, wheezing, orthopnea, PND, worsening LE edema, palpitations, dizziness or syncope.  Pt denies neurological change such as new Headache, facial or extremity weakness.  Pt denies polydipsia, polyuria, or low sugar symptoms. Pt states overall good compliance with treatment and medications, good tolerability, and trying to follow lower cholesterol diet.  Pt denies worsening depressive symptoms, suicidal ideation or panic. No fever, wt loss, night sweats, loss of appetite, or other constitutional symptoms.  Pt states good ability with ADL's, low fall risk, home safety reviewed and adequate, no significant changes in hearing or vision, and occasionally active with exercise.  Fell walking down a hill with tripping 2 mo ago, seen in ER ([facility]) and tx with pain med, as suggested to see chiropracter but did not go.   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 Mild weakness, and ? Cramping, somewhat better with muscle relaxer.Marland Kitchen Left handed, and handwriting is poor when this happens.  Better with less hand use.    Also with a tender rather large mass to the left breast for 2 mo without overlying skin change, rash or nipple d/c Past Medical History  Diagnosis Date  . 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 Asthma   . Gallstones 2011  . LGSIL (low grade squamous intraepithelial dysplasia)   . RHINITIS, ALLERGIC 04/04/2006    Qualifier: Diagnosis of  By: Abundio Miu    . ASTHMA, INTERMITTENT 04/04/2006    Qualifier: Diagnosis of  By: Lelon Perla MD, Vickki Muff    . ACNE 04/04/2006    Qualifier: Diagnosis of  By: Abundio Miu    . Migraine 11/21/2011   Past Surgical History  Procedure  Date  . Colposcopy 2009  . Cholecystectomy     reports that she has been smoking Cigarettes.  She has been smoking about .5 packs per day. She has never used smokeless tobacco. She reports that she does not drink alcohol or use illicit drugs. family history includes Breast cancer in her maternal grandmother; Diabetes in her father; Hypertension in her mother; and Multiple sclerosis in her mother. Allergies  Allergen Reactions  . Eggs Or Egg-Derived Products Swelling  . Penicillins Rash  . Sulfonamide Derivatives Rash   No current outpatient prescriptions on file prior to visit.   Review of Systems Review of Systems  Constitutional: Negative for diaphoresis, activity change, appetite change and unexpected weight change.  HENT: Negative for hearing loss, ear pain, facial swelling, mouth sores and neck stiffness.   Eyes: Negative for pain, redness and visual disturbance.  Respiratory: Negative for shortness of breath and wheezing.   Cardiovascular: Negative for chest pain and palpitations.  Gastrointestinal: Negative for diarrhea, blood in stool, abdominal distention and rectal pain.  Genitourinary: Negative for hematuria, flank pain and decreased urine volume.  Musculoskeletal: Negative for myalgias and joint swelling.  Skin: Negative for color change and wound.  Neurological: Negative for syncope and numbness.  Hematological: Negative for adenopathy.  Psychiatric/Behavioral: Negative for hallucinations, self-injury, decreased concentration and agitation.      Objective:   Physical Exam BP 94/60  Pulse 75  Temp 98 F (36.7 C) (Oral)  Ht 5\' 6"  (1.676 m)  Wt 227 lb 6 oz (103.137 kg)  BMI 36.70 kg/m2  SpO2 97%  LMP 11/05/2011 Physical Exam  VS noted Constitutional: Pt is oriented to person, place, and time. Appears well-developed and well-nourished.  HENT:  Head: Normocephalic and atraumatic.  Right Ear: External ear normal.  Left Ear: External ear normal.  Nose: Nose  normal.  Mouth/Throat: Oropharynx is clear and moist.  Left breast with nondiscrete subq ? Golf ball sized tender mass without overlying skin change or drainage, no nipple d/c Right breast neg for abnormal Eyes: Conjunctivae and EOM are normal. Pupils are equal, round, and reactive to light.  Neck: Normal range of motion. Neck supple. No JVD present. No tracheal deviation present.  Cardiovascular: Normal rate, regular rhythm, normal heart sounds and intact distal pulses.   Pulmonary/Chest: Effort normal and breath sounds normal.  Abdominal: Soft. Bowel sounds are normal. There is no tenderness.  Musculoskeletal: Normal range of motion. Exhibits no edema.  Lymphadenopathy:  Has no cervical adenopathy.  Spine: nontender Neurological: Pt is alert and oriented to person, place, and time. Pt has normal reflexes. No cranial nerve deficit. Motor/sens/dtr/gait intact Skin: Skin is warm and dry. No rash noted.  Psychiatric:  Has  normal mood and affect. Behavior is normal.     Assessment & Plan:

## 2011-11-21 NOTE — Assessment & Plan Note (Signed)

## 2011-11-22 ENCOUNTER — Telehealth: Payer: Self-pay

## 2011-11-22 DIAGNOSIS — N6320 Unspecified lump in the left breast, unspecified quadrant: Secondary | ICD-10-CM

## 2011-11-22 NOTE — Telephone Encounter (Signed)
Yes please order a [place]

## 2011-11-22 NOTE — Telephone Encounter (Signed)
Done per emr 

## 2011-11-22 NOTE — Telephone Encounter (Signed)
Message copied by Pincus Sanes on Thu Nov 22, 2011  4:01 PM ------      Message from: Corwin Levins      Created: Thu Nov 22, 2011 12:38 PM       Ok - do I need to change the order for just an ultrasound?      ----- Message -----         From: Vladimir Crofts Enoc Getter         Sent: 11/22/2011  10:20 AM           To: Corwin Levins, MD            [facility] to inform the patient is too young to do a mammogram.  But they can do US of the left breast

## 2011-11-25 ENCOUNTER — Encounter: Payer: Self-pay | Admitting: Internal Medicine

## 2011-11-25 NOTE — Assessment & Plan Note (Signed)
Mild to mod symptoms, for hand surgury referral

## 2011-11-25 NOTE — Assessment & Plan Note (Signed)
Suspect fairly large breast cyst with pain, for u/s, consider needle drainage

## 2011-11-25 NOTE — Assessment & Plan Note (Signed)
Lower back, no trauma but as she is young, no prior imaging, so will check lumbar films to r/o unusual spine rheum dz

## 2011-12-03 ENCOUNTER — Ambulatory Visit
Admission: RE | Admit: 2011-12-03 | Discharge: 2011-12-03 | Disposition: A | Discharge status: Home / Self Care | Payer: 59 | Source: Ambulatory Visit | Attending: Internal Medicine | Admitting: Internal Medicine

## 2011-12-03 DIAGNOSIS — N6320 Unspecified lump in the left breast, unspecified quadrant: Secondary | ICD-10-CM

## 2011-12-06 ENCOUNTER — Ambulatory Visit: Payer: 59

## 2012-03-22 ENCOUNTER — Other Ambulatory Visit: Payer: Self-pay

## 2012-04-16 ENCOUNTER — Encounter (HOSPITAL_COMMUNITY): Payer: Self-pay | Admitting: Emergency Medicine

## 2012-04-16 ENCOUNTER — Emergency Department (HOSPITAL_COMMUNITY)
Admission: EM | Admit: 2012-04-16 | Discharge: 2012-04-16 | Disposition: A | Discharge status: Home / Self Care | Payer: 59 | Attending: Emergency Medicine | Admitting: Emergency Medicine

## 2012-04-16 ENCOUNTER — Emergency Department (HOSPITAL_COMMUNITY): Payer: 59

## 2012-04-16 DIAGNOSIS — F17200 Nicotine dependence, unspecified, uncomplicated: Secondary | ICD-10-CM | POA: Insufficient documentation

## 2012-04-16 DIAGNOSIS — R059 Cough, unspecified: Secondary | ICD-10-CM | POA: Insufficient documentation

## 2012-04-16 DIAGNOSIS — R0602 Shortness of breath: Secondary | ICD-10-CM | POA: Insufficient documentation

## 2012-04-16 DIAGNOSIS — J399 Disease of upper respiratory tract, unspecified: Secondary | ICD-10-CM

## 2012-04-16 DIAGNOSIS — R071 Chest pain on breathing: Secondary | ICD-10-CM | POA: Insufficient documentation

## 2012-04-16 DIAGNOSIS — Z8619 Personal history of other infectious and parasitic diseases: Secondary | ICD-10-CM | POA: Insufficient documentation

## 2012-04-16 DIAGNOSIS — Z8719 Personal history of other diseases of the digestive system: Secondary | ICD-10-CM | POA: Insufficient documentation

## 2012-04-16 DIAGNOSIS — Z872 Personal history of diseases of the skin and subcutaneous tissue: Secondary | ICD-10-CM | POA: Insufficient documentation

## 2012-04-16 DIAGNOSIS — Z8679 Personal history of other diseases of the circulatory system: Secondary | ICD-10-CM | POA: Insufficient documentation

## 2012-04-16 DIAGNOSIS — J45909 Unspecified asthma, uncomplicated: Secondary | ICD-10-CM | POA: Insufficient documentation

## 2012-04-16 DIAGNOSIS — J029 Acute pharyngitis, unspecified: Secondary | ICD-10-CM | POA: Insufficient documentation

## 2012-04-16 DIAGNOSIS — R062 Wheezing: Secondary | ICD-10-CM | POA: Insufficient documentation

## 2012-04-16 DIAGNOSIS — Z79899 Other long term (current) drug therapy: Secondary | ICD-10-CM | POA: Insufficient documentation

## 2012-04-16 DIAGNOSIS — J3489 Other specified disorders of nose and nasal sinuses: Secondary | ICD-10-CM | POA: Insufficient documentation

## 2012-04-16 DIAGNOSIS — J069 Acute upper respiratory infection, unspecified: Secondary | ICD-10-CM | POA: Insufficient documentation

## 2012-04-16 DIAGNOSIS — R05 Cough: Secondary | ICD-10-CM | POA: Insufficient documentation

## 2012-04-16 MED ORDER — IPRATROPIUM BROMIDE 0.02 % IN SOLN
0.5000 mg | Freq: Once | RESPIRATORY_TRACT | Status: AC
Start: 2012-04-16 — End: 2012-04-16
  Administered 2012-04-16: 0.5 mg via RESPIRATORY_TRACT
  Filled 2012-04-16: qty 2.5

## 2012-04-16 MED ORDER — ACETAMINOPHEN 325 MG PO TABS
650.0000 mg | ORAL_TABLET | Freq: Once | ORAL | Status: AC
  Administered 2012-04-16: 650 mg via ORAL
  Filled 2012-04-16: qty 2

## 2012-04-16 MED ORDER — ALBUTEROL SULFATE (5 MG/ML) 0.5% IN NEBU
5.0000 mg | INHALATION_SOLUTION | Freq: Once | RESPIRATORY_TRACT | Status: AC
  Administered 2012-04-16: 5 mg via RESPIRATORY_TRACT
  Filled 2012-04-16: qty 1

## 2012-04-16 NOTE — ED Provider Notes (Signed)
History    This chart was scribed for Glade Nurse PA-C a non-physician practitioner working with Flint Melter, MD by Lewanda Rife, ED Scribe. This patient was seen in room WTR6/WTR6 and the patient's care was started at 2132.    CSN: [id number]  Arrival date & time 04/16/12  1805   First MD Initiated Contact with Patient 04/16/12 2114      Chief Complaint  Patient presents with  . Cough  . Sore Throat  . Chest Pain    (Consider location/radiation/quality/duration/timing/severity/associated sxs/prior treatment) HPI Tamara Garza is a 22 y.o. female who presents to the Emergency Department complaining of constant non-radiating moderate chest wall pain onset 2 days. (Pt clarified chest pain from earlier triage complaint as not cardiac in nature). Pt states pain is 7/10 in severity. Pt reports sore throat, cough, rhinorrhea, SOB, "itchy ears", and subjective fever. Pt denies dysuria, and otalgia. Pt reports using inhaler today and OTC sinus medications with no relief of symptoms. Pt reports hx of asthma.   Past Medical History  Diagnosis Date  . 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 Asthma   . Gallstones 2011  . LGSIL (low grade squamous intraepithelial dysplasia)   . RHINITIS, ALLERGIC 04/04/2006    Qualifier: Diagnosis of  By: Abundio Miu    . ASTHMA, INTERMITTENT 04/04/2006    Qualifier: Diagnosis of  By: Lelon Perla MD, Vickki Muff    . ACNE 04/04/2006    Qualifier: Diagnosis of  By: Abundio Miu    . Migraine 11/21/2011    Past Surgical History  Procedure Laterality Date  . Colposcopy  2009  . Cholecystectomy      Family History  Problem Relation Age of Onset  . Multiple sclerosis Mother   . Hypertension Mother   . Diabetes Father   . Breast cancer Maternal Grandmother     History  Substance Use Topics  . Smoking status: Current Every Day Smoker -- 0.50 packs/day    Types: Cigarettes  . Smokeless tobacco: Never Used  . Alcohol Use: No    OB History    Grav Para Term Preterm Abortions TAB SAB Ect Mult Living                  Review of Systems  Constitutional: Positive for fever. Negative for diaphoresis.  HENT: Positive for sore throat and rhinorrhea. Negative for neck pain and neck stiffness.   Eyes: Negative for visual disturbance.  Respiratory: Positive for cough and wheezing. Negative for apnea, chest tightness and shortness of breath.   Cardiovascular: Positive for chest pain. Negative for palpitations.  Gastrointestinal: Negative for nausea, vomiting, diarrhea and constipation.  Genitourinary: Negative for dysuria.  Musculoskeletal: Negative for gait problem.  Skin: Negative for rash.  Neurological: Negative for dizziness, weakness, light-headedness, numbness and headaches.   A complete 10 system review of systems was obtained and all systems are negative except as noted in the HPI and PMH.    Allergies  Eggs or egg-derived products; Penicillins; and Sulfonamide derivatives  Home Medications   Current Outpatient Rx  Name  Route  Sig  Dispense  Refill  . albuterol (PROVENTIL HFA;VENTOLIN HFA) 108 (90 BASE) MCG/ACT inhaler   Inhalation   Inhale 1-2 puffs into the lungs every 6 (six) hours as needed for wheezing.   1 Inhaler   2     BP 125/82  Pulse 96  Temp(Src) 100.1 F (37.8 C) (Oral)  Resp 20  SpO2 100%  LMP 03/19/2012  Physical Exam  Nursing note and vitals reviewed. Constitutional: She is oriented to person, place, and time. She appears well-developed and well-nourished. No distress.  HENT:  Head: Normocephalic and atraumatic.  Right Ear: Tympanic membrane normal. Tympanic membrane is not erythematous, not retracted and not bulging.  Left Ear: Tympanic membrane normal. Tympanic membrane is not erythematous, not retracted and not bulging.  Mouth/Throat: Uvula is midline and mucous membranes are normal. Posterior oropharyngeal erythema present. No oropharyngeal exudate or posterior oropharyngeal edema.   Cerumen impacted bilaterally   Eyes: Conjunctivae and EOM are normal.  Neck: Normal range of motion. Neck supple.  No meningeal signs  Cardiovascular: Normal rate, regular rhythm and normal heart sounds.  Exam reveals no gallop and no friction rub.   No murmur heard. Pulmonary/Chest: Effort normal. No respiratory distress. She has wheezes (mild expiratory ). She has no rales. She exhibits no tenderness.  Abdominal: Soft. She exhibits no distension. There is no tenderness.  Musculoskeletal: Normal range of motion. She exhibits no edema and no tenderness.  Neurological: She is alert and oriented to person, place, and time. No cranial nerve deficit.  Skin: Skin is warm and dry. She is not diaphoretic. No erythema.  Psychiatric: She has a normal mood and affect. Her behavior is normal.    ED Course  Procedures (including critical care time) Medications  acetaminophen (TYLENOL) tablet 650 mg (650 mg Oral Given 04/16/12 2227)  albuterol (PROVENTIL) (5 MG/ML) 0.5% nebulizer solution 5 mg (5 mg Nebulization Given 04/16/12 2155)  ipratropium (ATROVENT) nebulizer solution 0.5 mg (0.5 mg Nebulization Given 04/16/12 2155)   Date: 04/05/2012  Rate: 95  Rhythm: sinus rhythm  QRS Axis: normal  Intervals: normal  ST/T Wave abnormalities: normal  Conduction Disutrbances: none  Narrative Interpretation: Normal EKG  Old EKG Reviewed: None available   Labs Reviewed  RAPID STREP SCREEN   Results for orders placed during the hospital encounter of 04/16/12  RAPID STREP SCREEN      Result Value Range   Streptococcus, Group A Screen (Direct) NEGATIVE  NEGATIVE    Dg Chest 2 View  04/16/2012  *RADIOLOGY REPORT*  Clinical Data: Short of breath, fever  CHEST - 2 VIEW  Comparison: Prior chest x-ray 10/04/2011  Findings: The lungs are well-aerated and free from pulmonary edema, focal airspace consolidation or pulmonary nodule.  Cardiac and mediastinal contours are within normal limits.  No pneumothorax, or  pleural effusion. No acute osseous findings.  IMPRESSION:  No acute cardiopulmonary disease.   Original Report Authenticated By: Malachy Moan, M.D.    I personally viewed the xray.  1. Upper respiratory disease       MDM  Rapid strep negative. On initital exam, pt not compliant when asked to take deep breaths. Slight expiratory wheezes heard bilaterally, but diff to assess. With hx of asthma, will try neb tx and re-evaluate.  On re-eval, pt able to take deeper breaths, wheeze has resolved, but now hearing course lung sounds. Poss pneumonia vs URI. Will get CXR.   Pt CXR negative for acute infiltrate. Patients symptoms are consistent with URI, likely viral etiology. Discussed that antibiotics are not indicated for viral infections. Pt will be discharged with symptomatic treatment.  Verbalizes understanding and is agreeable with plan. Pt is hemodynamically stable & in NAD prior to dc.   I personally performed the services described in this documentation, which was scribed in my presence. The recorded information has been reviewed and is accurate.    Glade Nurse, PA-C 04/18/12 0004

## 2012-04-16 NOTE — ED Notes (Signed)
RT called for second neb tx

## 2012-04-16 NOTE — ED Notes (Signed)
Pt c/o cough, sore throat and chest pain.  States these sx started 2 days ago.  States that she started coughing up clear/yellow mucus today.

## 2012-04-16 NOTE — ED Notes (Signed)
Pt ambulatory to exam room with limp, but independent gait. Pt has swelling and redness to medial side of L foot. States pain is in his heel when he walks.

## 2012-04-16 NOTE — ED Notes (Signed)
Received call from RN for repeat nebulizer treatment. Called to ICU for an emergency. Immediately called RN in ED to inform her I was not able to come there at this time and to inform her that there is no pending order for a repeat treatment. RN agrees to administer second treatment to the patient.

## 2012-04-16 NOTE — ED Notes (Signed)
Pt ambulatory to exam room with steady gait. Pt states she has had a productive cough since yesterday. Also c/o sore throat. Pt states she took some OTC sinus meds, but they are not working. Pt arrives with companion.

## 2012-04-16 NOTE — ED Notes (Signed)
Patient transported to X-ray 

## 2012-04-18 NOTE — ED Provider Notes (Signed)
Medical screening examination/treatment/procedure(s) were performed by non-physician practitioner and as supervising physician I was immediately available for consultation/collaboration.  Flint Melter, MD 04/18/12 [phone]

## 2012-04-24 ENCOUNTER — Ambulatory Visit: Payer: 59 | Admitting: Internal Medicine

## 2012-10-15 ENCOUNTER — Ambulatory Visit: Payer: 59 | Admitting: Internal Medicine

## 2012-12-11 ENCOUNTER — Other Ambulatory Visit: Payer: Self-pay

## 2013-01-05 ENCOUNTER — Emergency Department (HOSPITAL_COMMUNITY)
Admission: EM | Admit: 2013-01-05 | Discharge: 2013-01-06 | Disposition: A | Discharge status: Home / Self Care | Payer: 59 | Attending: Emergency Medicine | Admitting: Emergency Medicine

## 2013-01-05 ENCOUNTER — Encounter (HOSPITAL_COMMUNITY): Payer: Self-pay | Admitting: Emergency Medicine

## 2013-01-05 DIAGNOSIS — Z8619 Personal history of other infectious and parasitic diseases: Secondary | ICD-10-CM | POA: Insufficient documentation

## 2013-01-05 DIAGNOSIS — Z8679 Personal history of other diseases of the circulatory system: Secondary | ICD-10-CM | POA: Insufficient documentation

## 2013-01-05 DIAGNOSIS — T628X1A Toxic effect of other specified noxious substances eaten as food, accidental (unintentional), initial encounter: Secondary | ICD-10-CM | POA: Insufficient documentation

## 2013-01-05 DIAGNOSIS — Y939 Activity, unspecified: Secondary | ICD-10-CM | POA: Insufficient documentation

## 2013-01-05 DIAGNOSIS — Z880 Allergy status to penicillin: Secondary | ICD-10-CM | POA: Insufficient documentation

## 2013-01-05 DIAGNOSIS — T7840XA Allergy, unspecified, initial encounter: Secondary | ICD-10-CM

## 2013-01-05 DIAGNOSIS — Z79899 Other long term (current) drug therapy: Secondary | ICD-10-CM | POA: Insufficient documentation

## 2013-01-05 DIAGNOSIS — L272 Dermatitis due to ingested food: Secondary | ICD-10-CM | POA: Insufficient documentation

## 2013-01-05 DIAGNOSIS — Y929 Unspecified place or not applicable: Secondary | ICD-10-CM | POA: Insufficient documentation

## 2013-01-05 DIAGNOSIS — J45909 Unspecified asthma, uncomplicated: Secondary | ICD-10-CM | POA: Insufficient documentation

## 2013-01-05 DIAGNOSIS — Z8719 Personal history of other diseases of the digestive system: Secondary | ICD-10-CM | POA: Insufficient documentation

## 2013-01-05 DIAGNOSIS — F17200 Nicotine dependence, unspecified, uncomplicated: Secondary | ICD-10-CM | POA: Insufficient documentation

## 2013-01-05 MED ORDER — HYDROXYZINE HCL 25 MG PO TABS
25.0000 mg | ORAL_TABLET | Freq: Once | ORAL | Status: AC
  Administered 2013-01-05: 25 mg via ORAL
  Filled 2013-01-05: qty 1

## 2013-01-05 MED ORDER — PREDNISONE 20 MG PO TABS
60.0000 mg | ORAL_TABLET | Freq: Once | ORAL | Status: AC
  Administered 2013-01-05: 60 mg via ORAL
  Filled 2013-01-05: qty 3

## 2013-01-05 MED ORDER — FAMOTIDINE 20 MG PO TABS
20.0000 mg | ORAL_TABLET | Freq: Once | ORAL | Status: AC
  Administered 2013-01-05: 20 mg via ORAL
  Filled 2013-01-05: qty 1

## 2013-01-05 NOTE — ED Notes (Signed)
Pt reports she has an allergy to eggs and ate frozen yogurt that had eggs in it.  Pt reports her mouth is itching and she feels like it is hard to breathe. Pt reports taking 50mg  Benadryl at 2215.

## 2013-01-05 NOTE — ED Provider Notes (Signed)
CSN: [id number]     Arrival date & time 01/05/13  2236 History   First MD Initiated Contact with Patient 01/05/13 2259     Chief Complaint  Patient presents with  . Allergic Reaction   (Consider location/radiation/quality/duration/timing/severity/associated sxs/prior Treatment) HPI Patient states she has a egg allergies. She states she ate some frozen yogurt tonight which had raw eggs in it. She reports less than 2 minutes later she started having itching of her mouth and especially of her tongue. She also felt like the outside of her lips are itching. She states she had a burning in her mouth and it was hard to swallow. She reports this happened about 10 PM. She did take Benadryl and she states it has helped only a little bit. She denies any itching of her body or rash of her body. She states she did have shortness of breath earlier but not now. She states she's having some trouble swallowing. She denies any wheezing. She also states that she started getting a sharp pain in her forehead that radiates into her eyes while waiting in the ED.  PCP none  Past Medical History  Diagnosis Date  . 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 Asthma   . Gallstones 2011  . LGSIL (low grade squamous intraepithelial dysplasia)   . RHINITIS, ALLERGIC 04/04/2006    Qualifier: Diagnosis of  By: Abundio Miu    . ASTHMA, INTERMITTENT 04/04/2006    Qualifier: Diagnosis of  By: Lelon Perla MD, Vickki Muff    . ACNE 04/04/2006    Qualifier: Diagnosis of  By: Abundio Miu    . Migraine 11/21/2011   Past Surgical History  Procedure Laterality Date  . Colposcopy  2009  . Cholecystectomy     Family History  Problem Relation Age of Onset  . Multiple sclerosis Mother   . Hypertension Mother   . Diabetes Father   . Breast cancer Maternal Grandmother    History  Substance Use Topics  . Smoking status: Current Every Day Smoker -- 0.50 packs/day    Types: Cigarettes  . Smokeless tobacco: Never Used  . Alcohol Use:  Yes     Comment: occ.  employed    OB History   Grav Para Term Preterm Abortions TAB SAB Ect Mult Living                 Review of Systems  All other systems reviewed and are negative.    Allergies  Eggs or egg-derived products; Penicillins; and Sulfonamide derivatives  Home Medications   Current Outpatient Rx  Name  Route  Sig  Dispense  Refill  . albuterol (PROVENTIL HFA;VENTOLIN HFA) 108 (90 BASE) MCG/ACT inhaler   Inhalation   Inhale 1 puff into the lungs every 6 (six) hours as needed for wheezing or shortness of breath.         . diphenhydrAMINE (BENADRYL) 25 mg capsule   Oral   Take 25 mg by mouth every 6 (six) hours as needed. For allergic reaction          BP 127/89  Pulse 87  Temp(Src) 98.1 F (36.7 C) (Oral)  Resp 16  SpO2 100%  LMP 09/05/2012  Vital signs normal   Physical Exam  Nursing note and vitals reviewed. Constitutional: She is oriented to person, place, and time. She appears well-developed and well-nourished.  Non-toxic appearance. She does not appear ill. No distress.  HENT:  Head: Normocephalic and atraumatic.  Right Ear: External ear normal.  Left Ear:  External ear normal.  Nose: Nose normal. No mucosal edema or rhinorrhea.  Mouth/Throat: Oropharynx is clear and moist and mucous membranes are normal. No dental abscesses or uvula swelling.  Normal speech, no obvious swelling to her tongue or soft palate. Patient is not drooling, she has a drink on her bedside table.  Eyes: Conjunctivae and EOM are normal. Pupils are equal, round, and reactive to light.  Neck: Normal range of motion and full passive range of motion without pain. Neck supple.  No obvious swelling to her neck on visual inspection, no lymphadenopathy  Cardiovascular: Normal rate, regular rhythm and normal heart sounds.  Exam reveals no gallop and no friction rub.   No murmur heard. Pulmonary/Chest: Effort normal and breath sounds normal. No respiratory distress. She has no  wheezes. She has no rhonchi. She has no rales. She exhibits no tenderness and no crepitus.  Abdominal: Soft. Normal appearance and bowel sounds are normal. She exhibits no distension. There is no tenderness. There is no rebound and no guarding.  Musculoskeletal: Normal range of motion. She exhibits no edema and no tenderness.  Moves all extremities well.   Neurological: She is alert and oriented to person, place, and time. She has normal strength. No cranial nerve deficit.  Skin: Skin is warm, dry and intact. No rash noted. No erythema. No pallor.  Psychiatric: She has a normal mood and affect. Her speech is normal and behavior is normal. Her mood appears not anxious.    ED Course  Procedures (including critical care time)  Medications  predniSONE (DELTASONE) tablet 60 mg (60 mg Oral Given 01/05/13 2338)  famotidine (PEPCID) tablet 20 mg (20 mg Oral Given 01/05/13 2338)  hydrOXYzine (ATARAX/VISTARIL) tablet 25 mg (25 mg Oral Given 01/05/13 2338)   Pt is feeling better, ready to go home.    Labs Review Labs Reviewed - No data to display Imaging Review No results found.  EKG Interpretation   None       MDM  patient presents with food allergy that improved with medication.    1. Allergic reaction, initial encounter     New Prescriptions   FAMOTIDINE (PEPCID) 20 MG TABLET    Take 2 tablets (40 mg total) by mouth 2 (two) times daily.   PREDNISONE (DELTASONE) 20 MG TABLET    Take 3 po QD x 2d starting tomorrow, Tuesday evening, then 2 po QD x 3d then 1 po QD x 3d    Plan discharge  Devoria Albe, MD, Franz Dell, MD 01/06/13 0111

## 2013-01-06 MED ORDER — FAMOTIDINE 20 MG PO TABS: 40.0000 mg | ORAL_TABLET | Freq: Two times a day (BID) | ORAL | Status: DC

## 2013-01-06 MED ORDER — PREDNISONE 20 MG PO TABS: ORAL_TABLET | ORAL | Status: DC

## 2013-01-06 NOTE — ED Notes (Signed)
PT left before discharge teaching, papers left at triage window

## 2013-02-07 ENCOUNTER — Emergency Department (HOSPITAL_BASED_OUTPATIENT_CLINIC_OR_DEPARTMENT_OTHER)
Admission: EM | Admit: 2013-02-07 | Discharge: 2013-02-07 | Disposition: A | Discharge status: Home / Self Care | Payer: 59 | Attending: Emergency Medicine | Admitting: Emergency Medicine

## 2013-02-07 ENCOUNTER — Encounter (HOSPITAL_BASED_OUTPATIENT_CLINIC_OR_DEPARTMENT_OTHER): Payer: Self-pay | Admitting: Emergency Medicine

## 2013-02-07 DIAGNOSIS — J45901 Unspecified asthma with (acute) exacerbation: Secondary | ICD-10-CM | POA: Insufficient documentation

## 2013-02-07 DIAGNOSIS — H6692 Otitis media, unspecified, left ear: Secondary | ICD-10-CM

## 2013-02-07 DIAGNOSIS — H6690 Otitis media, unspecified, unspecified ear: Secondary | ICD-10-CM | POA: Insufficient documentation

## 2013-02-07 DIAGNOSIS — R52 Pain, unspecified: Secondary | ICD-10-CM | POA: Insufficient documentation

## 2013-02-07 DIAGNOSIS — F17200 Nicotine dependence, unspecified, uncomplicated: Secondary | ICD-10-CM | POA: Insufficient documentation

## 2013-02-07 DIAGNOSIS — Z8679 Personal history of other diseases of the circulatory system: Secondary | ICD-10-CM | POA: Insufficient documentation

## 2013-02-07 DIAGNOSIS — J069 Acute upper respiratory infection, unspecified: Secondary | ICD-10-CM

## 2013-02-07 DIAGNOSIS — Z79899 Other long term (current) drug therapy: Secondary | ICD-10-CM | POA: Insufficient documentation

## 2013-02-07 MED ORDER — AZITHROMYCIN 250 MG PO TABS: 250.0000 mg | ORAL_TABLET | Freq: Every day | ORAL | Status: DC

## 2013-02-07 MED ORDER — GUAIFENESIN-CODEINE 100-10 MG/5ML PO SYRP: 5.0000 mL | ORAL_SOLUTION | Freq: Three times a day (TID) | ORAL | Status: DC | PRN

## 2013-02-07 MED ORDER — ALBUTEROL SULFATE HFA 108 (90 BASE) MCG/ACT IN AERS: 1.0000 | INHALATION_SPRAY | Freq: Four times a day (QID) | RESPIRATORY_TRACT | Status: DC | PRN

## 2013-02-07 NOTE — ED Provider Notes (Signed)
CSN: [id number]     Arrival date & time 02/07/13  1111 History   First MD Initiated Contact with Patient 02/07/13 1258     Chief Complaint  Patient presents with  . Nasal Congestion  . Cough  . Fever  . Generalized Body Aches   (Consider location/radiation/quality/duration/timing/severity/associated sxs/prior Treatment) Patient is a 23 y.o. female presenting with cough and fever. The history is provided by the patient.  Cough Cough characteristics:  Productive Sputum characteristics:  Yellow Severity:  Moderate Onset quality:  Gradual Duration:  2 days Timing:  Constant Chronicity:  New Smoker: yes   Context: sick contacts and upper respiratory infection   Relieved by:  Nothing Worsened by:  Lying down Ineffective treatments:  Decongestant Associated symptoms: chills, ear fullness, ear pain, fever (low grade), myalgias, rhinorrhea, shortness of breath, sinus congestion and wheezing   Associated symptoms: no chest pain, no eye discharge, no rash and no sore throat   Fever Associated symptoms: chills, cough, ear pain, myalgias and rhinorrhea   Associated symptoms: no chest pain, no confusion, no dysuria, no nausea, no rash, no sore throat and no vomiting    Melana Tasia Catchings is a 23 y.o. female who presents to the ED with cough, cold and congestion that started 2 days ago. She has been exposed to her boyfriend, family and coworkers with same symptoms. PMH significant for asthma. She has an inhaler but it has expired.   Past Medical History  Diagnosis Date  . 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 Asthma   . Gallstones 2011  . LGSIL (low grade squamous intraepithelial dysplasia)   . RHINITIS, ALLERGIC 04/04/2006    Qualifier: Diagnosis of  By: Abundio Miu    . ASTHMA, INTERMITTENT 04/04/2006    Qualifier: Diagnosis of  By: Lelon Perla MD, Vickki Muff    . ACNE 04/04/2006    Qualifier: Diagnosis of  By: Abundio Miu    . Migraine 11/21/2011   Past Surgical History  Procedure Laterality  Date  . Colposcopy  2009  . Cholecystectomy     Family History  Problem Relation Age of Onset  . Multiple sclerosis Mother   . Hypertension Mother   . Diabetes Father   . Breast cancer Maternal Grandmother    History  Substance Use Topics  . Smoking status: Current Every Day Smoker -- 0.50 packs/day    Types: Cigarettes  . Smokeless tobacco: Never Used  . Alcohol Use: Yes     Comment: occ.   OB History   Grav Para Term Preterm Abortions TAB SAB Ect Mult Living                 Review of Systems  Constitutional: Positive for fever (low grade) and chills.  HENT: Positive for ear pain and rhinorrhea. Negative for sore throat.   Eyes: Negative for discharge and visual disturbance.  Respiratory: Positive for cough, shortness of breath and wheezing.   Cardiovascular: Negative for chest pain.  Gastrointestinal: Negative for nausea, vomiting and abdominal pain.  Genitourinary: Negative for dysuria, urgency and frequency.  Musculoskeletal: Positive for myalgias.  Skin: Negative for rash.  Neurological: Negative for dizziness, syncope and light-headedness.  Psychiatric/Behavioral: Negative for confusion. The patient is not nervous/anxious.     Allergies  Eggs or egg-derived products; Penicillins; and Sulfonamide derivatives  Home Medications   Current Outpatient Rx  Name  Route  Sig  Dispense  Refill  . albuterol (PROVENTIL HFA;VENTOLIN HFA) 108 (90 BASE) MCG/ACT inhaler   Inhalation   Inhale  1 puff into the lungs every 6 (six) hours as needed for wheezing or shortness of breath.         . diphenhydrAMINE (BENADRYL) 25 mg capsule   Oral   Take 25 mg by mouth every 6 (six) hours as needed. For allergic reaction          BP 110/83  Pulse 92  Temp(Src) 98.9 F (37.2 C) (Oral)  Resp 16  Ht 5\' 6"  (1.676 m)  Wt 239 lb (108.41 kg)  BMI 38.59 kg/m2  SpO2 99%  LMP 12/08/2012 Physical Exam  Nursing note and vitals reviewed. Constitutional: She is oriented to person,  place, and time. She appears well-developed and well-nourished.  HENT:  Head: Normocephalic and atraumatic.  Right Ear: Tympanic membrane and external ear normal.  Left Ear: External ear normal. Tympanic membrane is erythematous and retracted.  Nose: Mucosal edema and rhinorrhea present.  Mouth/Throat: Uvula is midline and mucous membranes are normal. Posterior oropharyngeal erythema present.  Eyes: EOM are normal.  Neck: Neck supple.  Cardiovascular: Normal rate and regular rhythm.   Pulmonary/Chest: Effort normal. No respiratory distress. She has wheezes (occasional). She has no rales. She exhibits no tenderness.  Abdominal: Soft. There is no tenderness.  Musculoskeletal: Normal range of motion.  Neurological: She is alert and oriented to person, place, and time. No cranial nerve deficit.  Skin: Skin is warm and dry.  Psychiatric: She has a normal mood and affect. Her behavior is normal.    ED Course  Procedures  MDM  23 y.o. female with URI and otitis media left. Discussed with the patient clinical findings and plan of care. All questioned fully answered. She will return if any problems arise.     Medication List    TAKE these medications       azithromycin 250 MG tablet  Commonly known as:  ZITHROMAX  Take 1 tablet (250 mg total) by mouth daily. Take first 2 tablets together, then 1 every day until finished.     guaiFENesin-codeine 100-10 MG/5ML syrup  Commonly known as:  ROBITUSSIN AC  Take 5 mLs by mouth 3 (three) times daily as needed for cough.      ASK your doctor about these medications       albuterol 108 (90 BASE) MCG/ACT inhaler  Commonly known as:  PROVENTIL HFA;VENTOLIN HFA  Inhale 1-2 puffs into the lungs every 6 (six) hours as needed for wheezing or shortness of breath.  Ask about: Which instructions should I use?     albuterol 108 (90 BASE) MCG/ACT inhaler  Commonly known as:  PROVENTIL HFA;VENTOLIN HFA  Inhale 1 puff into the lungs every 6 (six) hours  as needed for wheezing or shortness of breath.  Ask about: Which instructions should I use?     diphenhydrAMINE 25 mg capsule  Commonly known as:  BENADRYL  Take 25 mg by mouth every 6 (six) hours as needed. For allergic reaction         Janne NapoleonHope M Roann Merk, NP 02/07/13 [phone]

## 2013-02-07 NOTE — ED Provider Notes (Signed)
Medical screening examination/treatment/procedure(s) were performed by non-physician practitioner and as supervising physician I was immediately available for consultation/collaboration.  EKG Interpretation   None        Enid Skeens, MD 02/07/13 Rickey Primus

## 2013-02-07 NOTE — ED Notes (Signed)
Pt having nasal congestion, cough, fever, generalized aches for two days.

## 2013-02-07 NOTE — Discharge Instructions (Signed)
Cool Mist Vaporizers °Vaporizers may help relieve the symptoms of a cough and cold. They add moisture to the air, which helps mucus to become thinner and less sticky. This makes it easier to breathe and cough up secretions. Cool mist vaporizers do not cause serious burns like hot mist vaporizers ("steamers, humidifiers"). Vaporizers have not been proved to show they help with colds. You should not use a vaporizer if you are allergic to mold.  °HOME CARE INSTRUCTIONS °· Follow the package instructions for the vaporizer. °· Do not use anything other than distilled water in the vaporizer. °· Do not run the vaporizer all of the time. This can cause mold or bacteria to grow in the vaporizer. °· Clean the vaporizer after each time it is used. °· Clean and dry the vaporizer well before storing it. °· Stop using the vaporizer if worsening respiratory symptoms develop. °Document Released: 10/20/2003 Document Revised: 09/24/2012 Document Reviewed: 06/11/2012 °[employer]® Patient Information ©2014 [employer]. ° °Cough, Adult ° A cough is a reflex. It helps you clear your throat and airways. A cough can help heal your body. A cough can last 2 or 3 weeks (acute) or may last more than 8 weeks (chronic). Some common causes of a cough can include an infection, allergy, or a cold. °HOME CARE °· Only take medicine as told by your doctor. °· If given, take your medicines (antibiotics) as told. Finish them even if you start to feel better. °· Use a cold steam vaporizer or humidier in your home. This can help loosen thick spit (secretions). °· Sleep so you are almost sitting up (semi-upright). Use pillows to do this. This helps reduce coughing. °· Rest as needed. °· Stop smoking if you smoke. °GET HELP RIGHT AWAY IF: °· You have yellowish-white fluid (pus) in your thick spit. °· Your cough gets worse. °· Your medicine does not reduce coughing, and you are losing sleep. °· You cough up blood. °· You have trouble breathing. °· Your pain  gets worse and medicine does not help. °· You have a fever. °MAKE SURE YOU:  °· Understand these instructions. °· Will watch your condition. °· Will get help right away if you are not doing well or get worse. °Document Released: 10/05/2010 Document Revised: 04/16/2011 Document Reviewed: 10/05/2010 °[employer]® Patient Information ©2014 [employer]. ° °

## 2013-03-24 ENCOUNTER — Emergency Department (HOSPITAL_BASED_OUTPATIENT_CLINIC_OR_DEPARTMENT_OTHER)
Admission: EM | Admit: 2013-03-24 | Discharge: 2013-03-25 | Disposition: A | Discharge status: Home / Self Care | Payer: 59 | Attending: Emergency Medicine | Admitting: Emergency Medicine

## 2013-03-24 ENCOUNTER — Encounter (HOSPITAL_BASED_OUTPATIENT_CLINIC_OR_DEPARTMENT_OTHER): Payer: Self-pay | Admitting: Emergency Medicine

## 2013-03-24 DIAGNOSIS — F17200 Nicotine dependence, unspecified, uncomplicated: Secondary | ICD-10-CM | POA: Insufficient documentation

## 2013-03-24 DIAGNOSIS — Z880 Allergy status to penicillin: Secondary | ICD-10-CM | POA: Insufficient documentation

## 2013-03-24 DIAGNOSIS — Z8679 Personal history of other diseases of the circulatory system: Secondary | ICD-10-CM | POA: Insufficient documentation

## 2013-03-24 DIAGNOSIS — E860 Dehydration: Secondary | ICD-10-CM

## 2013-03-24 DIAGNOSIS — R42 Dizziness and giddiness: Secondary | ICD-10-CM | POA: Insufficient documentation

## 2013-03-24 DIAGNOSIS — A088 Other specified intestinal infections: Secondary | ICD-10-CM | POA: Insufficient documentation

## 2013-03-24 DIAGNOSIS — Z8619 Personal history of other infectious and parasitic diseases: Secondary | ICD-10-CM | POA: Insufficient documentation

## 2013-03-24 DIAGNOSIS — R51 Headache: Secondary | ICD-10-CM | POA: Insufficient documentation

## 2013-03-24 DIAGNOSIS — Z3202 Encounter for pregnancy test, result negative: Secondary | ICD-10-CM | POA: Insufficient documentation

## 2013-03-24 DIAGNOSIS — J45909 Unspecified asthma, uncomplicated: Secondary | ICD-10-CM | POA: Insufficient documentation

## 2013-03-24 DIAGNOSIS — Z8719 Personal history of other diseases of the digestive system: Secondary | ICD-10-CM | POA: Insufficient documentation

## 2013-03-24 DIAGNOSIS — Z872 Personal history of diseases of the skin and subcutaneous tissue: Secondary | ICD-10-CM | POA: Insufficient documentation

## 2013-03-24 DIAGNOSIS — Z79899 Other long term (current) drug therapy: Secondary | ICD-10-CM | POA: Insufficient documentation

## 2013-03-24 DIAGNOSIS — A084 Viral intestinal infection, unspecified: Secondary | ICD-10-CM

## 2013-03-24 NOTE — ED Notes (Signed)
Fever and vomiting x 3 days. Was seen at an ED in [facility] earlier in the week for same and was given Rx for Zofran. She is not better.

## 2013-03-25 LAB — URINALYSIS, ROUTINE W REFLEX MICROSCOPIC
Glucose, UA: NEGATIVE mg/dL
Ketones, ur: 15 mg/dL — AB
Leukocytes, UA: NEGATIVE
Nitrite: NEGATIVE
Protein, ur: 30 mg/dL — AB
Specific Gravity, Urine: 1.037 — ABNORMAL HIGH (ref 1.005–1.030)
Urobilinogen, UA: 0.2 mg/dL (ref 0.0–1.0)
pH: 6 (ref 5.0–8.0)

## 2013-03-25 LAB — PREGNANCY, URINE: PREG TEST UR: NEGATIVE

## 2013-03-25 LAB — URINE MICROSCOPIC-ADD ON

## 2013-03-25 LAB — CLOSTRIDIUM DIFFICILE BY PCR: CDIFFPCR: NEGATIVE

## 2013-03-25 MED ORDER — LOPERAMIDE HCL 2 MG PO CAPS
4.0000 mg | ORAL_CAPSULE | Freq: Once | ORAL | Status: AC
  Administered 2013-03-25: 4 mg via ORAL
  Filled 2013-03-25: qty 2

## 2013-03-25 MED ORDER — PROMETHAZINE HCL 25 MG RE SUPP: 25.0000 mg | Freq: Four times a day (QID) | RECTAL | Status: DC | PRN

## 2013-03-25 MED ORDER — IBUPROFEN 600 MG PO TABS: 600.0000 mg | ORAL_TABLET | Freq: Four times a day (QID) | ORAL | Status: DC | PRN

## 2013-03-25 MED ORDER — LOPERAMIDE HCL 2 MG PO CAPS: 2.0000 mg | ORAL_CAPSULE | Freq: Four times a day (QID) | ORAL | Status: DC | PRN

## 2013-03-25 MED ORDER — PROMETHAZINE HCL 25 MG PO TABS: 25.0000 mg | ORAL_TABLET | Freq: Four times a day (QID) | ORAL | Status: DC | PRN

## 2013-03-25 NOTE — Discharge Instructions (Signed)
Viral Gastroenteritis °Viral gastroenteritis is also known as stomach flu. This condition affects the stomach and intestinal tract. It can cause sudden diarrhea and vomiting. The illness typically lasts 3 to 8 days. Most people develop an immune response that eventually gets rid of the virus. While this natural response develops, the virus can make you quite ill. °CAUSES  °Many different viruses can cause gastroenteritis, such as rotavirus or noroviruses. You can catch one of these viruses by consuming contaminated food or water. You may also catch a virus by sharing utensils or other personal items with an infected person or by touching a contaminated surface. °SYMPTOMS  °The most common symptoms are diarrhea and vomiting. These problems can cause a severe loss of body fluids (dehydration) and a body salt (electrolyte) imbalance. Other symptoms may include: °· Fever. °· Headache. °· Fatigue. °· Abdominal pain. °DIAGNOSIS  °Your caregiver can usually diagnose viral gastroenteritis based on your symptoms and a physical exam. A stool sample may also be taken to test for the presence of viruses or other infections. °TREATMENT  °This illness typically goes away on its own. Treatments are aimed at rehydration. The most serious cases of viral gastroenteritis involve vomiting so severely that you are not able to keep fluids down. In these cases, fluids must be given through an intravenous line (IV). °HOME CARE INSTRUCTIONS  °· Drink enough fluids to keep your urine clear or pale yellow. Drink small amounts of fluids frequently and increase the amounts as tolerated. °· Ask your caregiver for specific rehydration instructions. °· Avoid: °· Foods high in sugar. °· Alcohol. °· Carbonated drinks. °· Tobacco. °· Juice. °· Caffeine drinks. °· Extremely hot or cold fluids. °· Fatty, greasy foods. °· Too much intake of anything at one time. °· Dairy products until 24 to 48 hours after diarrhea stops. °· You may consume probiotics.  Probiotics are active cultures of beneficial bacteria. They may lessen the amount and number of diarrheal stools in adults. Probiotics can be found in yogurt with active cultures and in supplements. °· Wash your hands well to avoid spreading the virus. °· Only take over-the-counter or prescription medicines for pain, discomfort, or fever as directed by your caregiver. Do not give aspirin to children. Antidiarrheal medicines are not recommended. °· Ask your caregiver if you should continue to take your regular prescribed and over-the-counter medicines. °· Keep all follow-up appointments as directed by your caregiver. °SEEK IMMEDIATE MEDICAL CARE IF:  °· You are unable to keep fluids down. °· You do not urinate at least once every 6 to 8 hours. °· You develop shortness of breath. °· You notice blood in your stool or vomit. This may look like coffee grounds. °· You have abdominal pain that increases or is concentrated in one small area (localized). °· You have persistent vomiting or diarrhea. °· You have a fever. °· The patient is a child younger than 3 months, and he or she has a fever. °· The patient is a child older than 3 months, and he or she has a fever and persistent symptoms. °· The patient is a child older than 3 months, and he or she has a fever and symptoms suddenly get worse. °· The patient is a baby, and he or she has no tears when crying. °MAKE SURE YOU:  °· Understand these instructions. °· Will watch your condition. °· Will get help right away if you are not doing well or get worse. °Document Released: 01/22/2005 Document Revised: 04/16/2011 Document Reviewed: 11/08/2010 °  ExitCare Patient Information 2014 [place] [place].  Dehydration, Adult Dehydration is when you lose more fluids from the body than you take in. Vital organs like the kidneys, brain, and heart cannot function without a proper amount of fluids and salt. Any loss of fluids from the body can cause dehydration.  CAUSES    Vomiting.  Diarrhea.  Excessive sweating.  Excessive urine output.  Fever. SYMPTOMS  Mild dehydration  Thirst.  Dry lips.  Slightly dry mouth. Moderate dehydration  Very dry mouth.  Sunken eyes.  Skin does not bounce back quickly when lightly pinched and released.  Dark urine and decreased urine production.  Decreased tear production.  Headache. Severe dehydration  Very dry mouth.  Extreme thirst.  Rapid, weak pulse (more than 100 beats per minute at rest).  Cold hands and feet.  Not able to sweat in spite of heat and temperature.  Rapid breathing.  Blue lips.  Confusion and lethargy.  Difficulty being awakened.  Minimal urine production.  No tears. DIAGNOSIS  Your caregiver will diagnose dehydration based on your symptoms and your exam. Blood and urine tests will help confirm the diagnosis. The diagnostic evaluation should also identify the cause of dehydration. TREATMENT  Treatment of mild or moderate dehydration can often be done at home by increasing the amount of fluids that you drink. It is best to drink small amounts of fluid more often. Drinking too much at one time can make vomiting worse. Refer to the home care instructions below. Severe dehydration needs to be treated at the hospital where you will probably be given intravenous (IV) fluids that contain water and electrolytes. HOME CARE INSTRUCTIONS   Ask your caregiver about specific rehydration instructions.  Drink enough fluids to keep your urine clear or pale yellow.  Drink small amounts frequently if you have nausea and vomiting.  Eat as you normally do.  Avoid:  Foods or drinks high in sugar.  Carbonated drinks.  Juice.  Extremely hot or cold fluids.  Drinks with caffeine.  Fatty, greasy foods.  Alcohol.  Tobacco.  Overeating.  Gelatin desserts.  Wash your hands well to avoid spreading bacteria and viruses.  Only take over-the-counter or prescription  medicines for pain, discomfort, or fever as directed by your caregiver.  Ask your caregiver if you should continue all prescribed and over-the-counter medicines.  Keep all follow-up appointments with your caregiver. SEEK MEDICAL CARE IF:  You have abdominal pain and it increases or stays in one area (localizes).  You have a rash, stiff neck, or severe headache.  You are irritable, sleepy, or difficult to awaken.  You are weak, dizzy, or extremely thirsty. SEEK IMMEDIATE MEDICAL CARE IF:   You are unable to keep fluids down or you get worse despite treatment.  You have frequent episodes of vomiting or diarrhea.  You have blood or green matter (bile) in your vomit.  You have blood in your stool or your stool looks black and tarry.  You have not urinated in 6 to 8 hours, or you have only urinated a small amount of very dark urine.  You have a fever.  You faint. MAKE SURE YOU:   Understand these instructions.  Will watch your condition.  Will get help right away if you are not doing well or get worse. Document Released: 01/22/2005 Document Revised: 04/16/2011 Document Reviewed: 09/11/2010 [facility]Exit[facility] Patient Information 2014 [place], [place].

## 2013-03-25 NOTE — ED Provider Notes (Signed)
CSN: [id number]     Arrival date & time 03/24/13  2247 History   First MD Initiated Contact with Patient 03/24/13 2305     Chief Complaint  Patient presents with  . Fever  . Emesis     (Consider location/radiation/quality/duration/timing/severity/associated sxs/prior Treatment) HPI Comments: Pt with no medical, surgical hx comes in with cc of fevers and vomiting x 3 days. Pt also started having diarrhea today. She has had about 3-4 episodes of emesis today, and 10 episodes of loose BM. No blood with either. Pt is having subjective fevers with chills. She also has lower quadrant abd pain - no uti like sx, no vaginal discharge, bleeding. On her period right now.  Patient is a 23 y.o. female presenting with fever and vomiting. The history is provided by the patient.  Fever Associated symptoms: chills, diarrhea, headaches, nausea and vomiting   Associated symptoms: no chest pain and no dysuria   Emesis Associated symptoms: abdominal pain, chills, diarrhea and headaches     Past Medical History  Diagnosis Date  . 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 Asthma   . Gallstones 2011  . LGSIL (low grade squamous intraepithelial dysplasia)   . RHINITIS, ALLERGIC 04/04/2006    Qualifier: Diagnosis of  By: Abundio Miu    . ASTHMA, INTERMITTENT 04/04/2006    Qualifier: Diagnosis of  By: Lelon Perla MD, Vickki Muff    . ACNE 04/04/2006    Qualifier: Diagnosis of  By: Abundio Miu    . Migraine 11/21/2011   Past Surgical History  Procedure Laterality Date  . Colposcopy  2009  . Cholecystectomy     Family History  Problem Relation Age of Onset  . Multiple sclerosis Mother   . Hypertension Mother   . Diabetes Father   . Breast cancer Maternal Grandmother    History  Substance Use Topics  . Smoking status: Current Every Day Smoker -- 0.50 packs/day    Types: Cigarettes  . Smokeless tobacco: Never Used  . Alcohol Use: Yes     Comment: occ.   OB History   Grav Para Term Preterm Abortions  TAB SAB Ect Mult Living                 Review of Systems  Constitutional: Positive for fever, chills and activity change.  Respiratory: Negative for shortness of breath.   Cardiovascular: Negative for chest pain.  Gastrointestinal: Positive for nausea, vomiting, abdominal pain and diarrhea.  Genitourinary: Negative for dysuria, frequency, hematuria, flank pain and pelvic pain.  Musculoskeletal: Negative for neck pain.  Neurological: Positive for dizziness and headaches.      Allergies  Eggs or egg-derived products; Penicillins; and Sulfonamide derivatives  Home Medications   Current Outpatient Rx  Name  Route  Sig  Dispense  Refill  . albuterol (PROVENTIL HFA;VENTOLIN HFA) 108 (90 BASE) MCG/ACT inhaler   Inhalation   Inhale 1 puff into the lungs every 6 (six) hours as needed for wheezing or shortness of breath.         Marland Kitchen albuterol (PROVENTIL HFA;VENTOLIN HFA) 108 (90 BASE) MCG/ACT inhaler   Inhalation   Inhale 1-2 puffs into the lungs every 6 (six) hours as needed for wheezing or shortness of breath.   1 Inhaler   0   . azithromycin (ZITHROMAX) 250 MG tablet   Oral   Take 1 tablet (250 mg total) by mouth daily. Take first 2 tablets together, then 1 every day until finished.   6 tablet   0   .  diphenhydrAMINE (BENADRYL) 25 mg capsule   Oral   Take 25 mg by mouth every 6 (six) hours as needed. For allergic reaction         . guaiFENesin-codeine (ROBITUSSIN AC) 100-10 MG/5ML syrup   Oral   Take 5 mLs by mouth 3 (three) times daily as needed for cough.   120 mL   0   . ibuprofen (ADVIL,MOTRIN) 600 MG tablet   Oral   Take 1 tablet (600 mg total) by mouth every 6 (six) hours as needed.   30 tablet   0   . loperamide (IMODIUM) 2 MG capsule   Oral   Take 1 capsule (2 mg total) by mouth 4 (four) times daily as needed for diarrhea or loose stools.   12 capsule   0   . promethazine (PHENERGAN) 25 MG suppository   Rectal   Place 1 suppository (25 mg total)  rectally every 6 (six) hours as needed for nausea.   12 each   0   . promethazine (PHENERGAN) 25 MG tablet   Oral   Take 1 tablet (25 mg total) by mouth every 6 (six) hours as needed for nausea.   30 tablet   0    BP 125/80  Pulse 98  Temp(Src) 99.2 F (37.3 C) (Oral)  Resp 18  Ht 5\' 6"  (1.676 m)  Wt 220 lb (99.791 kg)  BMI 35.53 kg/m2  SpO2 96%  LMP 03/21/2013 Physical Exam  Nursing note and vitals reviewed. Constitutional: She is oriented to person, place, and time. She appears well-developed and well-nourished.  HENT:  Head: Normocephalic and atraumatic.  Eyes: EOM are normal. Pupils are equal, round, and reactive to light.  Neck: Neck supple.  Cardiovascular: Normal rate, regular rhythm and normal heart sounds.   No murmur heard. Pulmonary/Chest: Effort normal. No respiratory distress.  Abdominal: Soft. She exhibits no distension. There is tenderness. There is no rebound and no guarding.  Lower quadrant tenderness diffuse, and bilateral  Neurological: She is alert and oriented to person, place, and time.  Skin: Skin is warm and dry.    ED Course  Procedures (including critical care time) Labs Review Labs Reviewed  URINALYSIS, ROUTINE W REFLEX MICROSCOPIC - Abnormal; Notable for the following:    Color, Urine AMBER (*)    APPearance CLOUDY (*)    Specific Gravity, Urine 1.037 (*)    Hgb urine dipstick LARGE (*)    Bilirubin Urine SMALL (*)    Ketones, ur 15 (*)    Protein, ur 30 (*)    All other components within normal limits  CLOSTRIDIUM DIFFICILE BY PCR  PREGNANCY, URINE  URINE MICROSCOPIC-ADD ON   Imaging Review No results found.  EKG Interpretation   None       MDM   Final diagnoses:  Viral gastroenteritis  Dehydration    Pt comes in with m/v/abd pain/diarrhhea and sub fevers and chills. Sick x 3 days - sx progressing. UA shows ketones - immodiun prescribed. Had recent antibiotic use - so cdiff checked. Appears to be vital gastro -  tolerated po well here. Will rx phenergan as she cannot afford zofran.   Derwood KaplanAnkit Mahamadou Weltz, MD 03/25/13 [phone]

## 2013-03-25 NOTE — ED Notes (Signed)
Pt given ginger ale to drink. 

## 2013-06-24 ENCOUNTER — Emergency Department (HOSPITAL_COMMUNITY)
Admission: EM | Admit: 2013-06-24 | Discharge: 2013-06-25 | Disposition: A | Discharge status: Home / Self Care | Payer: 59 | Attending: Emergency Medicine | Admitting: Emergency Medicine

## 2013-06-24 ENCOUNTER — Encounter (HOSPITAL_COMMUNITY): Payer: Self-pay | Admitting: Emergency Medicine

## 2013-06-24 DIAGNOSIS — J45909 Unspecified asthma, uncomplicated: Secondary | ICD-10-CM | POA: Insufficient documentation

## 2013-06-24 DIAGNOSIS — N390 Urinary tract infection, site not specified: Secondary | ICD-10-CM | POA: Insufficient documentation

## 2013-06-24 DIAGNOSIS — Z9089 Acquired absence of other organs: Secondary | ICD-10-CM | POA: Insufficient documentation

## 2013-06-24 DIAGNOSIS — M549 Dorsalgia, unspecified: Secondary | ICD-10-CM

## 2013-06-24 DIAGNOSIS — G8929 Other chronic pain: Secondary | ICD-10-CM | POA: Insufficient documentation

## 2013-06-24 DIAGNOSIS — Z79899 Other long term (current) drug therapy: Secondary | ICD-10-CM | POA: Insufficient documentation

## 2013-06-24 DIAGNOSIS — Z9889 Other specified postprocedural states: Secondary | ICD-10-CM | POA: Insufficient documentation

## 2013-06-24 DIAGNOSIS — F17200 Nicotine dependence, unspecified, uncomplicated: Secondary | ICD-10-CM | POA: Insufficient documentation

## 2013-06-24 DIAGNOSIS — B9689 Other specified bacterial agents as the cause of diseases classified elsewhere: Secondary | ICD-10-CM | POA: Insufficient documentation

## 2013-06-24 DIAGNOSIS — Z8679 Personal history of other diseases of the circulatory system: Secondary | ICD-10-CM | POA: Insufficient documentation

## 2013-06-24 DIAGNOSIS — M546 Pain in thoracic spine: Secondary | ICD-10-CM | POA: Insufficient documentation

## 2013-06-24 DIAGNOSIS — Z872 Personal history of diseases of the skin and subcutaneous tissue: Secondary | ICD-10-CM | POA: Insufficient documentation

## 2013-06-24 DIAGNOSIS — A499 Bacterial infection, unspecified: Secondary | ICD-10-CM | POA: Insufficient documentation

## 2013-06-24 DIAGNOSIS — Z880 Allergy status to penicillin: Secondary | ICD-10-CM | POA: Insufficient documentation

## 2013-06-24 DIAGNOSIS — Z8719 Personal history of other diseases of the digestive system: Secondary | ICD-10-CM | POA: Insufficient documentation

## 2013-06-24 DIAGNOSIS — Z3202 Encounter for pregnancy test, result negative: Secondary | ICD-10-CM | POA: Insufficient documentation

## 2013-06-24 DIAGNOSIS — N760 Acute vaginitis: Secondary | ICD-10-CM | POA: Insufficient documentation

## 2013-06-24 NOTE — ED Notes (Signed)
Per pt report: pt c/o of abd pain and back pain.  Pt's back pain in the middle of her back.  Pt used to go to physical therapy for her back pain but has stopped.  Pt reports lower abd and "ovary" pain x 1 week.  Pt reports she was on her period for about a month and then it stopped for a few days before beginning again.  The pain in her abd and ovaries started when her period came back.  Pt a/o x 4. NAD noted. Pt ambulatory. Pt has nausea.

## 2013-06-25 LAB — URINALYSIS, ROUTINE W REFLEX MICROSCOPIC
Bilirubin Urine: NEGATIVE
Glucose, UA: NEGATIVE mg/dL
Hgb urine dipstick: NEGATIVE
KETONES UR: NEGATIVE mg/dL
Nitrite: NEGATIVE
Protein, ur: NEGATIVE mg/dL
SPECIFIC GRAVITY, URINE: 1.031 — AB (ref 1.005–1.030)
UROBILINOGEN UA: 0.2 mg/dL (ref 0.0–1.0)
pH: 6 (ref 5.0–8.0)

## 2013-06-25 LAB — PREGNANCY, URINE: Preg Test, Ur: NEGATIVE

## 2013-06-25 LAB — URINE MICROSCOPIC-ADD ON

## 2013-06-25 LAB — WET PREP, GENITAL
Trich, Wet Prep: NONE SEEN
Yeast Wet Prep HPF POC: NONE SEEN

## 2013-06-25 LAB — GC/CHLAMYDIA PROBE AMP
CT PROBE, AMP APTIMA: NEGATIVE
GC Probe RNA: NEGATIVE

## 2013-06-25 MED ORDER — NAPROXEN 500 MG PO TABS: 500.0000 mg | ORAL_TABLET | Freq: Two times a day (BID) | ORAL | Status: DC

## 2013-06-25 MED ORDER — NAPROXEN 500 MG PO TABS
500.0000 mg | ORAL_TABLET | Freq: Two times a day (BID) | ORAL | Status: DC
  Administered 2013-06-25: 500 mg via ORAL
  Filled 2013-06-25: qty 1

## 2013-06-25 MED ORDER — METRONIDAZOLE 500 MG PO TABS: 500.0000 mg | ORAL_TABLET | Freq: Two times a day (BID) | ORAL | Status: DC

## 2013-06-25 MED ORDER — METRONIDAZOLE 500 MG PO TABS
500.0000 mg | ORAL_TABLET | Freq: Once | ORAL | Status: AC
  Administered 2013-06-25: 500 mg via ORAL
  Filled 2013-06-25: qty 1

## 2013-06-25 MED ORDER — CIPROFLOXACIN HCL 500 MG PO TABS: 500.0000 mg | ORAL_TABLET | Freq: Once | ORAL | Status: DC

## 2013-06-25 MED ORDER — CIPROFLOXACIN HCL 500 MG PO TABS
500.0000 mg | ORAL_TABLET | Freq: Once | ORAL | Status: AC
  Administered 2013-06-25: 500 mg via ORAL
  Filled 2013-06-25: qty 1

## 2013-06-25 NOTE — ED Provider Notes (Signed)
CSN: [id number]     Arrival date & time 06/24/13  2303 History   First MD Initiated Contact with Patient 06/25/13 0008     Chief Complaint  Patient presents with  . Abdominal Pain  . Back Pain     (Consider location/radiation/quality/duration/timing/severity/associated sxs/prior Treatment) HPI 23 year old female presents to emergency room with 2 complaints.  She reports acute on chronic upper mid back pain and lower abdominal pain.  Patient reports she has had upper back pain before.  She reports that she was going to physical therapy where they were doing exercises and heat therapy on her. she reports over the last few weeks her pain has gotten worse.  No numbness weakness tingling, difficulties, walking, no bowel or bladder incontinence.  She has not started back on it the exercises that were taught her at physical therapy. Patient reports pain to her lower abdomen for the last week.  She describes it as ovary pain, but locates the pain in her suprapubic region.  She denies any dysuria.  She reports new sexual partner.  She has had vaginal discharge.  Patient reports that she had a period for roughly 6 weeks, was without bleeding per week, and has since started back to vaginal bleeding.  Patient is been seen by a gynecologist in several years.  Patient took azo when she was having dysuria which is since resolved. Past Medical History  Diagnosis Date  . 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 Asthma   . Gallstones 2011  . LGSIL (low grade squamous intraepithelial dysplasia)   . RHINITIS, ALLERGIC 04/04/2006    Qualifier: Diagnosis of  By: Abundio MiuMcGregor, Barbara    . ASTHMA, INTERMITTENT 04/04/2006    Qualifier: Diagnosis of  By: Lelon PerlaSaunders MD, Vickki MuffWeston    . ACNE 04/04/2006    Qualifier: Diagnosis of  By: Abundio MiuMcGregor, Barbara    . Migraine 11/21/2011   Past Surgical History  Procedure Laterality Date  . Colposcopy  2009  . Cholecystectomy     Family History  Problem Relation Age of Onset  . Multiple  sclerosis Mother   . Hypertension Mother   . Diabetes Father   . Breast cancer Maternal Grandmother    History  Substance Use Topics  . Smoking status: Current Every Day Smoker -- 0.25 packs/day    Types: Cigarettes  . Smokeless tobacco: Never Used  . Alcohol Use: Yes     Comment: occ.   OB History   Grav Para Term Preterm Abortions TAB SAB Ect Mult Living                 Review of Systems    See History of Present Illness; otherwise all other systems are reviewed and negative  Allergies  Eggs or egg-derived products; Penicillins; and Sulfonamide derivatives  Home Medications   Prior to Admission medications   Medication Sig Start Date End Date Taking? Authorizing Provider  albuterol (PROVENTIL HFA;VENTOLIN HFA) 108 (90 BASE) MCG/ACT inhaler Inhale 1 puff into the lungs every 6 (six) hours as needed for wheezing or shortness of breath.   Yes Historical Provider, MD  phenazopyridine (PYRIDIUM) 95 MG tablet Take 95 mg by mouth 3 (three) times daily as needed for pain.   Yes Historical Provider, MD  ciprofloxacin (CIPRO) 500 MG tablet Take 1 tablet (500 mg total) by mouth once. 06/25/13   Olivia Mackielga M Wallace Gappa, MD  metroNIDAZOLE (FLAGYL) 500 MG tablet Take 1 tablet (500 mg total) by mouth 2 (two) times daily. 06/25/13   Marian Sorrowlga  Kellie Simmering, MD  naproxen (NAPROSYN) 500 MG tablet Take 1 tablet (500 mg total) by mouth 2 (two) times daily with a meal. 06/25/13   Olivia Mackie, MD   BP 109/63  Pulse 99  Temp(Src) 98.5 F (36.9 C) (Oral)  Resp 20  SpO2 97%  LMP 06/18/2013 Physical Exam  Nursing note and vitals reviewed. Constitutional: She is oriented to person, place, and time. She appears well-developed and well-nourished.  HENT:  Head: Normocephalic and atraumatic.  Nose: Nose normal.  Mouth/Throat: Oropharynx is clear and moist.  Eyes: Conjunctivae and EOM are normal. Pupils are equal, round, and reactive to light.  Neck: Normal range of motion. Neck supple. No JVD present. No tracheal  deviation present. No thyromegaly present.  Cardiovascular: Normal rate, regular rhythm, normal heart sounds and intact distal pulses.  Exam reveals no gallop and no friction rub.   No murmur heard. Pulmonary/Chest: Effort normal and breath sounds normal. No stridor. No respiratory distress. She has no wheezes. She has no rales. She exhibits no tenderness.  Abdominal: Soft. Bowel sounds are normal. She exhibits no distension and no mass. There is no tenderness. There is no rebound and no guarding.  Genitourinary:  External genitalia normal Vagina with scant blood, white yellow discharge discharge Cervix closed no lesions No cervical motion tenderness Adnexa palpated, no masses but mild right adnexal tenderness  Bladder palpated no tenderness Uterus palpated no masses or tenderness    Musculoskeletal: Normal range of motion. She exhibits tenderness (mild paraspinal tenderness in upper thoracic region without step-off, crepitus, overlying skin changes). She exhibits no edema.  Lymphadenopathy:    She has no cervical adenopathy.  Neurological: She is alert and oriented to person, place, and time. She has normal reflexes. She exhibits normal muscle tone. Coordination normal.  Skin: Skin is warm and dry. No rash noted. No erythema. No pallor.  Psychiatric: She has a normal mood and affect. Her behavior is normal. Judgment and thought content normal.    ED Course  Procedures (including critical care time) Labs Review Labs Reviewed  WET PREP, GENITAL - Abnormal; Notable for the following:    Clue Cells Wet Prep HPF POC MANY (*)    WBC, Wet Prep HPF POC FEW (*)    All other components within normal limits  URINALYSIS, ROUTINE W REFLEX MICROSCOPIC - Abnormal; Notable for the following:    APPearance CLOUDY (*)    Specific Gravity, Urine 1.031 (*)    Leukocytes, UA SMALL (*)    All other components within normal limits  GC/CHLAMYDIA PROBE AMP  PREGNANCY, URINE  URINE MICROSCOPIC-ADD ON     Imaging Review No results found.   EKG Interpretation None      MDM   Final diagnoses:  Back pain  Bacterial vaginosis  Urinary tract infection   23 year old female with acute on chronic back pain, pelvic pain.  Found to have urinary tract and flexion and bacterial vaginosis.  She's also had some dysfunctional uterine bleeding recently, instructed to followup with GYN for further workup.    Olivia Mackie, MD 06/25/13 [phone]

## 2013-06-25 NOTE — Discharge Instructions (Signed)
Back Exercises Back exercises help treat and prevent back injuries. The goal is to increase your strength in your belly (abdominal) and back muscles. These exercises can also help with flexibility. Start these exercises when told by your doctor. HOME CARE Back exercises include: Pelvic Tilt.  Lie on your back with your knees bent. Tilt your pelvis until the lower part of your back is against the floor. Hold this position 5 to 10 sec. Repeat this exercise 5 to 10 times. Knee to Chest.  Pull 1 knee up against your chest and hold for 20 to 30 seconds. Repeat this with the other knee. This may be done with the other leg straight or bent, whichever feels better. Then, pull both knees up against your chest. Sit-Ups or Curl-Ups.  Bend your knees 90 degrees. Start with tilting your pelvis, and do a partial, slow sit-up. Only lift your upper half 30 to 45 degrees off the floor. Take at least 2 to 3 seonds for each sit-up. Do not do sit-ups with your knees out straight. If partial sit-ups are difficult, simply do the above but with only tightening your belly (abdominal) muscles and holding it as told. Hip-Lift.  Lie on your back with your knees flexed 90 degrees. Push down with your feet and shoulders as you raise your hips 2 inches off the floor. Hold for 10 seconds, repeat 5 to 10 times. Back Arches.  Lie on your stomach. Prop yourself up on bent elbows. Slowly press on your hands, causing an arch in your low back. Repeat 3 to 5 times. Shoulder-Lifts.  Lie face down with arms beside your body. Keep hips and belly pressed to floor as you slowly lift your head and shoulders off the floor. Do not overdo your exercises. Be careful in the beginning. Exercises may cause you some mild back discomfort. If the pain lasts for more than 15 minutes, stop the exercises until you see your doctor. Improvement with exercise for back problems is slow.  Document Released: 02/24/2010 Document Revised: 04/16/2011  Document Reviewed: 11/23/2010 [facility] Patient Information 2014 [place], [place].  Back Pain, Adult Back pain is very common. The pain often gets better over time. The cause of back pain is usually not dangerous. Most people can learn to manage their back pain on their own.  HOME CARE   Stay active. Start with short walks on flat ground if you can. Try to walk farther each day.  Do not sit, drive, or stand in one place for more than 30 minutes. Do not stay in bed.  Do not avoid exercise or work. Activity can help your back heal faster.  Be careful when you bend or lift an object. Bend at your knees, keep the object close to you, and do not twist.  Sleep on a firm mattress. Lie on your side, and bend your knees. If you lie on your back, put a pillow under your knees.  Only take medicines as told by your doctor.  Put ice on the injured area.  Put ice in a plastic bag.  Place a towel between your skin and the bag.  Leave the ice on for 15-20 minutes, 03-04 times a day for the first 2 to 3 days. After that, you can switch between ice and heat packs.  Ask your doctor about back exercises or massage.  Avoid feeling anxious or stressed. Find good ways to deal with stress, such as exercise. GET HELP RIGHT AWAY IF:   Your pain does not  go away with rest or medicine.  Your pain does not go away in 1 week.  You have new problems.  You do not feel well.  The pain spreads into your legs.  You cannot control when you poop (bowel movement) or pee (urinate).  Your arms or legs feel weak or lose feeling (numbness).  You feel sick to your stomach (nauseous) or throw up (vomit).  You have belly (abdominal) pain.  You feel like you may pass out (faint). MAKE SURE YOU:   Understand these instructions.  Will watch your condition.  Will get help right away if you are not doing well or get worse. Document Released: 07/11/2007 Document Revised: 04/16/2011 Document Reviewed:  06/12/2010 [facility] Patient Information 2014 [place], [place].  Bacterial Vaginosis Bacterial vaginosis is an infection of the vagina. It happens when too many of certain germs (bacteria) grow in the vagina. HOME CARE  Take your medicine as told by your doctor.  Finish your medicine even if you start to feel better.  Do not have sex until you finish your medicine and are better.  Tell your sex partner that you have an infection. They should see their doctor for treatment.  Practice safe sex. Use condoms. Have only one sex partner. GET HELP IF:  You are not getting better after 3 days of treatment.  You have more grey fluid (discharge) coming from your vagina than before.  You have more pain than before.  You have a fever. MAKE SURE YOU:   Understand these instructions.  Will watch your condition.  Will get help right away if you are not doing well or get worse. Document Released: 11/01/2007 Document Revised: 11/12/2012 Document Reviewed: 09/03/2012 [facility] Patient Information 2014 [place], [place].  Urinary Tract Infection A urinary tract infection (UTI) can occur any place along the urinary tract. The tract includes the kidneys, ureters, bladder, and urethra. A type of germ called bacteria often causes a UTI. UTIs are often helped with antibiotic medicine.  HOME CARE   If given, take antibiotics as told by your doctor. Finish them even if you start to feel better.  Drink enough fluids to keep your pee (urine) clear or pale yellow.  Avoid tea, drinks with caffeine, and bubbly (carbonated) drinks.  Pee often. Avoid holding your pee in for a long time.  Pee before and after having sex (intercourse).  Wipe from front to back after you poop (bowel movement) if you are a woman. Use each tissue only once. GET HELP RIGHT AWAY IF:   You have back pain.  You have lower belly (abdominal) pain.  You have chills.  You feel sick to your stomach (nauseous).  You throw up  (vomit).  Your burning or discomfort with peeing does not go away.  You have a fever.  Your symptoms are not better in 3 days. MAKE SURE YOU:   Understand these instructions.  Will watch your condition.  Will get help right away if you are not doing well or get worse. Document Released: 07/11/2007 Document Revised: 10/17/2011 Document Reviewed: 08/23/2011 [facility] Patient Information 2014 [place], [place].

## 2013-06-25 NOTE — ED Notes (Signed)
Pt reports low abd and back pain with irregular periods.

## 2013-07-08 ENCOUNTER — Emergency Department (HOSPITAL_COMMUNITY)
Admission: EM | Admit: 2013-07-08 | Discharge: 2013-07-08 | Disposition: A | Discharge status: Home / Self Care | Payer: 59 | Attending: Emergency Medicine | Admitting: Emergency Medicine

## 2013-07-08 DIAGNOSIS — R197 Diarrhea, unspecified: Secondary | ICD-10-CM | POA: Insufficient documentation

## 2013-07-08 DIAGNOSIS — F17200 Nicotine dependence, unspecified, uncomplicated: Secondary | ICD-10-CM | POA: Insufficient documentation

## 2013-07-08 DIAGNOSIS — E860 Dehydration: Secondary | ICD-10-CM

## 2013-07-08 DIAGNOSIS — J45909 Unspecified asthma, uncomplicated: Secondary | ICD-10-CM | POA: Insufficient documentation

## 2013-07-08 DIAGNOSIS — Z872 Personal history of diseases of the skin and subcutaneous tissue: Secondary | ICD-10-CM | POA: Insufficient documentation

## 2013-07-08 DIAGNOSIS — Z8679 Personal history of other diseases of the circulatory system: Secondary | ICD-10-CM | POA: Insufficient documentation

## 2013-07-08 DIAGNOSIS — Z792 Long term (current) use of antibiotics: Secondary | ICD-10-CM | POA: Insufficient documentation

## 2013-07-08 DIAGNOSIS — Z880 Allergy status to penicillin: Secondary | ICD-10-CM | POA: Insufficient documentation

## 2013-07-08 DIAGNOSIS — R112 Nausea with vomiting, unspecified: Secondary | ICD-10-CM

## 2013-07-08 LAB — URINE MICROSCOPIC-ADD ON

## 2013-07-08 LAB — URINALYSIS, ROUTINE W REFLEX MICROSCOPIC
Bilirubin Urine: NEGATIVE
Glucose, UA: NEGATIVE mg/dL
Hgb urine dipstick: NEGATIVE
KETONES UR: NEGATIVE mg/dL
NITRITE: NEGATIVE
PROTEIN: NEGATIVE mg/dL
Specific Gravity, Urine: 1.037 — ABNORMAL HIGH (ref 1.005–1.030)
UROBILINOGEN UA: 0.2 mg/dL (ref 0.0–1.0)
pH: 6 (ref 5.0–8.0)

## 2013-07-08 MED ORDER — METOCLOPRAMIDE HCL 10 MG PO TABS: 10.0000 mg | ORAL_TABLET | Freq: Four times a day (QID) | ORAL | Status: DC | PRN

## 2013-07-08 MED ORDER — SODIUM CHLORIDE 0.9 % IV SOLN: 1000.0000 mL | Freq: Once | INTRAVENOUS | Status: DC

## 2013-07-08 MED ORDER — SODIUM CHLORIDE 0.9 % IV SOLN: 1000.0000 mL | INTRAVENOUS | Status: DC

## 2013-07-08 NOTE — ED Provider Notes (Signed)
CSN: [id number]     Arrival date & time 07/08/13  0039 History   First MD Initiated Contact with Patient 07/08/13 [phone]     Chief Complaint  Patient presents with  . Emesis     (Consider location/radiation/quality/duration/timing/severity/associated sxs/prior Treatment) Patient is a 23 y.o. female presenting with vomiting. The history is provided by the patient.  Emesis She complains of nausea, vomiting, diarrhea for the last 3 days. She relates this to antibiotics that she was prescribed at an ED visit 2 weeks ago. She was given prescriptions for ciprofloxacin and metronidazole and she states that she is still taking them. She denies fever, chills, sweats. There is very mild, crampy lower abdominal pain which is not affected by vomiting or diarrhea. She rates pain at 2/10. There is no blood or mucus in the stool or emesis. Nothing makes symptoms better nothing makes it worse. She states that she is having 2 watery bowel movements a day instead of her usual one bowel movement a day. She is vomiting several times a day. She has not taken any medication for her nausea or diarrhea. She denies any sick contacts.  Past Medical History  Diagnosis Date  . 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 Asthma   . Gallstones 2011  . LGSIL (low grade squamous intraepithelial dysplasia)   . RHINITIS, ALLERGIC 04/04/2006    Qualifier: Diagnosis of  By: Abundio Miu    . ASTHMA, INTERMITTENT 04/04/2006    Qualifier: Diagnosis of  By: Lelon Perla MD, Vickki Muff    . ACNE 04/04/2006    Qualifier: Diagnosis of  By: Abundio Miu    . Migraine 11/21/2011   Past Surgical History  Procedure Laterality Date  . Colposcopy  2009  . Cholecystectomy     Family History  Problem Relation Age of Onset  . Multiple sclerosis Mother   . Hypertension Mother   . Diabetes Father   . Breast cancer Maternal Grandmother    History  Substance Use Topics  . Smoking status: Current Every Day Smoker -- 0.25 packs/day    Types:  Cigarettes  . Smokeless tobacco: Never Used  . Alcohol Use: Yes     Comment: occ.   OB History   Grav Para Term Preterm Abortions TAB SAB Ect Mult Living                 Review of Systems  Gastrointestinal: Positive for vomiting.  All other systems reviewed and are negative.     Allergies  Eggs or egg-derived products; Penicillins; and Sulfonamide derivatives  Home Medications   Prior to Admission medications   Medication Sig Start Date End Date Taking? Authorizing Provider  albuterol (PROVENTIL HFA;VENTOLIN HFA) 108 (90 BASE) MCG/ACT inhaler Inhale 1 puff into the lungs every 6 (six) hours as needed for wheezing or shortness of breath.   Yes Historical Provider, MD  ciprofloxacin (CIPRO) 500 MG tablet Take 500 mg by mouth 2 (two) times daily. 10 day therapy patient is only taking once daily due to side effects 07/02/13 07/13/13 Yes Historical Provider, MD  metroNIDAZOLE (FLAGYL) 500 MG tablet Take 500 mg by mouth 3 (three) times daily. 10 day therapy course patient has been taking once daily due to side effects 06/25/13 07/13/13 Yes Historical Provider, MD   BP 110/66  Pulse 111  Temp(Src) 98.9 F (37.2 C) (Oral)  SpO2 100%  LMP 06/18/2013 Physical Exam  Nursing note and vitals reviewed.  23 year old female, resting comfortably and in no acute distress.  Vital signs are significant for tachycardia with heart rate 111. Oxygen saturation is 100%, which is normal. Head is normocephalic and atraumatic. PERRLA, EOMI. Oropharynx is clear. Neck is nontender and supple without adenopathy or JVD. Back is nontender and there is no CVA tenderness. Lungs are clear without rales, wheezes, or rhonchi. Chest is nontender. Heart has regular rate and rhythm without murmur. Abdomen is soft, flat, nontender without masses or hepatosplenomegaly and peristalsis is hypoactive. Extremities have no cyanosis or edema, full range of motion is present. Skin is warm and dry without rash. Neurologic:  Mental status is normal, cranial nerves are intact, there are no motor or sensory deficits.  ED Course  Procedures (including critical care time) Labs Review Results for orders placed during the hospital encounter of 07/08/13  URINALYSIS, ROUTINE W REFLEX MICROSCOPIC      Result Value Ref Range   Color, Urine YELLOW  YELLOW   APPearance CLEAR  CLEAR   Specific Gravity, Urine 1.037 (*) 1.005 - 1.030   pH 6.0  5.0 - 8.0   Glucose, UA NEGATIVE  NEGATIVE mg/dL   Hgb urine dipstick NEGATIVE  NEGATIVE   Bilirubin Urine NEGATIVE  NEGATIVE   Ketones, ur NEGATIVE  NEGATIVE mg/dL   Protein, ur NEGATIVE  NEGATIVE mg/dL   Urobilinogen, UA 0.2  0.0 - 1.0 mg/dL   Nitrite NEGATIVE  NEGATIVE   Leukocytes, UA TRACE (*) NEGATIVE  URINE MICROSCOPIC-ADD ON      Result Value Ref Range   Squamous Epithelial / LPF RARE  RARE   WBC, UA 0-2  <3 WBC/hpf   Urine-Other MUCOUS PRESENT     MDM   Final diagnoses:  Nausea vomiting and diarrhea  Dehydration    Nausea, vomiting, diarrhea. Old records are reviewed and her ED visit was 2 weeks ago and she was prescribed a one week course of ciprofloxacin and metronidazole. Patient does admit that she waited several days before getting the prescriptions filled, but she is clearly not taking them as prescribed. I doubt that they are the source of her problem. It seems much more likely that she has a mild viral gastroenteritis. Ciprofloxacin was given for a UTI, and metronidazole was given for bacterial vaginosis. She states that she no longer has urinary symptoms and no longer has problems with vaginal discharge. Urinalysis will be checked to see if there is evidence of residual infection, but I anticipate stopping both antibiotics. In the meantime, she will be given IV fluids. She has not having difficulty with nausea or rectal urgency currently says she has not given any medication specifically for this. I have a very low index of suspicion for Clostridium difficile  and will not test for it. Even if she did have Clostridium difficile from ciprofloxacin, metronidazole would be the appropriate treatment.  She has refused blood draw and IV's. Urinalysis is significant for elevated specific gravity consistent with dehydration, but no ketones. She has not had any further vomiting in the ED. She will be discharged with prescription for metoclopramide and she is advised to discontinue her antibiotics. She is to take over-the-counter loperamide as needed for diarrhea.  Dione Boozeavid Casee Knepp, MD 07/08/13 [phone]

## 2013-07-08 NOTE — Discharge Instructions (Signed)
Take Loperamide (Imodium AD) as needed for diarrhea.  Stop taking Ciprofloxacin and Metronidazole.  Nausea and Vomiting Nausea is a sick feeling that often comes before throwing up (vomiting). Vomiting is a reflex where stomach contents come out of your mouth. Vomiting can cause severe loss of body fluids (dehydration). Children and elderly adults can become dehydrated quickly, especially if they also have diarrhea. Nausea and vomiting are symptoms of a condition or disease. It is important to find the cause of your symptoms. CAUSES   Direct irritation of the stomach lining. This irritation can result from increased acid production (gastroesophageal reflux disease), infection, food poisoning, taking certain medicines (such as nonsteroidal anti-inflammatory drugs), alcohol use, or tobacco use.  Signals from the brain.These signals could be caused by a headache, heat exposure, an inner ear disturbance, increased pressure in the brain from injury, infection, a tumor, or a concussion, pain, emotional stimulus, or metabolic problems.  An obstruction in the gastrointestinal tract (bowel obstruction).  Illnesses such as diabetes, hepatitis, gallbladder problems, appendicitis, kidney problems, cancer, sepsis, atypical symptoms of a heart attack, or eating disorders.  Medical treatments such as chemotherapy and radiation.  Receiving medicine that makes you sleep (general anesthetic) during surgery. DIAGNOSIS Your caregiver may ask for tests to be done if the problems do not improve after a few days. Tests may also be done if symptoms are severe or if the reason for the nausea and vomiting is not clear. Tests may include:  Urine tests.  Blood tests.  Stool tests.  Cultures (to look for evidence of infection).  X-rays or other imaging studies. Test results can help your caregiver make decisions about treatment or the need for additional tests. TREATMENT You need to stay well hydrated. Drink  frequently but in small amounts.You may wish to drink water, sports drinks, clear broth, or eat frozen ice pops or gelatin dessert to help stay hydrated.When you eat, eating slowly may help prevent nausea.There are also some antinausea medicines that may help prevent nausea. HOME CARE INSTRUCTIONS   Take all medicine as directed by your caregiver.  If you do not have an appetite, do not force yourself to eat. However, you must continue to drink fluids.  If you have an appetite, eat a normal diet unless your caregiver tells you differently.  Eat a variety of complex carbohydrates (rice, wheat, potatoes, bread), lean meats, yogurt, fruits, and vegetables.  Avoid high-fat foods because they are more difficult to digest.  Drink enough water and fluids to keep your urine clear or pale yellow.  If you are dehydrated, ask your caregiver for specific rehydration instructions. Signs of dehydration may include:  Severe thirst.  Dry lips and mouth.  Dizziness.  Dark urine.  Decreasing urine frequency and amount.  Confusion.  Rapid breathing or pulse. SEEK IMMEDIATE MEDICAL CARE IF:   You have blood or brown flecks (like coffee grounds) in your vomit.  You have black or bloody stools.  You have a severe headache or stiff neck.  You are confused.  You have severe abdominal pain.  You have chest pain or trouble breathing.  You do not urinate at least once every 8 hours.  You develop cold or clammy skin.  You continue to vomit for longer than 24 to 48 hours.  You have a fever. MAKE SURE YOU:   Understand these instructions.  Will watch your condition.  Will get help right away if you are not doing well or get worse. Document Released: 01/22/2005 Document Revised:  04/16/2011 Document Reviewed: 06/21/2010 [employer] Patient Information 2014 [place].  Diarrhea Diarrhea is frequent loose and watery bowel movements. It can cause you to feel weak and dehydrated.  Dehydration can cause you to become tired and thirsty, have a dry mouth, and have decreased urination that often is dark yellow. Diarrhea is a sign of another problem, most often an infection that will not last long. In most cases, diarrhea typically lasts 2 3 days. However, it can last longer if it is a sign of something more serious. It is important to treat your diarrhea as directed by your caregive to lessen or prevent future episodes of diarrhea. CAUSES  Some common causes include:  Gastrointestinal infections caused by viruses, bacteria, or parasites.  Food poisoning or food allergies.  Certain medicines, such as antibiotics, chemotherapy, and laxatives.  Artificial sweeteners and fructose.  Digestive disorders. HOME CARE INSTRUCTIONS  Ensure adequate fluid intake (hydration): have 1 cup (8 oz) of fluid for each diarrhea episode. Avoid fluids that contain simple sugars or sports drinks, fruit juices, whole milk products, and sodas. Your urine should be clear or pale yellow if you are drinking enough fluids. Hydrate with an oral rehydration solution that you can purchase at pharmacies, retail stores, and online. You can prepare an oral rehydration solution at home by mixing the following ingredients together:    tsp table salt.   tsp baking soda.   tsp salt substitute containing potassium chloride.  1  tablespoons sugar.  1 L (34 oz) of water.  Certain foods and beverages may increase the speed at which food moves through the gastrointestinal (GI) tract. These foods and beverages should be avoided and include:  Caffeinated and alcoholic beverages.  High-fiber foods, such as raw fruits and vegetables, nuts, seeds, and whole grain breads and cereals.  Foods and beverages sweetened with sugar alcohols, such as xylitol, sorbitol, and mannitol.  Some foods may be well tolerated and may help thicken stool including:  Starchy foods, such as rice, toast, pasta, low-sugar cereal,  oatmeal, grits, baked potatoes, crackers, and bagels.  Bananas.  Applesauce.  Add probiotic-rich foods to help increase healthy bacteria in the GI tract, such as yogurt and fermented milk products.  Wash your hands well after each diarrhea episode.  Only take over-the-counter or prescription medicines as directed by your caregiver.  Take a warm bath to relieve any burning or pain from frequent diarrhea episodes. SEEK IMMEDIATE MEDICAL CARE IF:   You are unable to keep fluids down.  You have persistent vomiting.  You have blood in your stool, or your stools are black and tarry.  You do not urinate in 6 8 hours, or there is only a small amount of very dark urine.  You have abdominal pain that increases or localizes.  You have weakness, dizziness, confusion, or lightheadedness.  You have a severe headache.  Your diarrhea gets worse or does not get better.  You have a fever or persistent symptoms for more than 2 3 days.  You have a fever and your symptoms suddenly get worse. MAKE SURE YOU:   Understand these instructions.  Will watch your condition.  Will get help right away if you are not doing well or get worse. Document Released: 01/12/2002 Document Revised: 01/09/2012 Document Reviewed: 09/30/2011 [facility] Patient Information 2014 [place], [place].  Metoclopramide tablets What is this medicine? METOCLOPRAMIDE (met oh kloe PRA mide) is used to treat the symptoms of gastroesophageal reflux disease (GERD) like heartburn. It is also used to  treat people with slow emptying of the stomach and intestinal tract. This medicine may be used for other purposes; ask your health care provider or pharmacist if you have questions. COMMON BRAND NAME(S): Reglan What should I tell my health care provider before I take this medicine? They need to know if you have any of these conditions: -breast cancer -depression -diabetes -heart failure -high blood pressure -kidney  disease -liver disease -Parkinson's disease or a movement disorder -pheochromocytoma -seizures -stomach obstruction, bleeding, or perforation -an unusual or allergic reaction to metoclopramide, procainamide, sulfites, other medicines, foods, dyes, or preservatives -pregnant or trying to get pregnant -breast-feeding How should I use this medicine? Take this medicine by mouth with a glass of water. Follow the directions on the prescription label. Take this medicine on an empty stomach, about 30 minutes before eating. Take your doses at regular intervals. Do not take your medicine more often than directed. Do not stop taking except on the advice of your doctor or health care professional. A special MedGuide will be given to you by the pharmacist with each prescription and refill. Be sure to read this information carefully each time. Talk to your pediatrician regarding the use of this medicine in children. Special care may be needed. Overdosage: If you think you have taken too much of this medicine contact a poison control center or emergency room at once. NOTE: This medicine is only for you. Do not share this medicine with others. What if I miss a dose? If you miss a dose, take it as soon as you can. If it is almost time for your next dose, take only that dose. Do not take double or extra doses. What may interact with this medicine? -acetaminophen -cyclosporine -digoxin -medicines for blood pressure -medicines for diabetes, including insulin -medicines for hay fever and other allergies -medicines for depression, especially an Monoamine Oxidase Inhibitor (MAOI) -medicines for Parkinson's disease, like levodopa -medicines for sleep or for pain -tetracycline This list may not describe all possible interactions. Give your health care provider a list of all the medicines, herbs, non-prescription drugs, or dietary supplements you use. Also tell them if you smoke, drink alcohol, or use illegal  drugs. Some items may interact with your medicine. What should I watch for while using this medicine? It may take a few weeks for your stomach condition to start to get better. However, do not take this medicine for longer than 12 weeks. The longer you take this medicine, and the more you take it, the greater your chances are of developing serious side effects. If you are an elderly patient, a female patient, or you have diabetes, you may be at an increased risk for side effects from this medicine. Contact your doctor immediately if you start having movements you cannot control such as lip smacking, rapid movements of the tongue, involuntary or uncontrollable movements of the eyes, head, arms and legs, or muscle twitches and spasms. Patients and their families should watch out for worsening depression or thoughts of suicide. Also watch out for any sudden or severe changes in feelings such as feeling anxious, agitated, panicky, irritable, hostile, aggressive, impulsive, severely restless, overly excited and hyperactive, or not being able to sleep. If this happens, especially at the beginning of treatment or after a change in dose, call your doctor. Do not treat yourself for high fever. Ask your doctor or health care professional for advice. You may get drowsy or dizzy. Do not drive, use machinery, or do anything that needs mental  alertness until you know how this drug affects you. Do not stand or sit up quickly, especially if you are an older patient. This reduces the risk of dizzy or fainting spells. Alcohol can make you more drowsy and dizzy. Avoid alcoholic drinks. What side effects may I notice from receiving this medicine? Side effects that you should report to your doctor or health care professional as soon as possible: -allergic reactions like skin rash, itching or hives, swelling of the face, lips, or tongue -abnormal production of milk in females -breast enlargement in both males and  females -change in the way you walk -difficulty moving, speaking or swallowing -drooling, lip smacking, or rapid movements of the tongue -excessive sweating -fever -involuntary or uncontrollable movements of the eyes, head, arms and legs -irregular heartbeat or palpitations -muscle twitches and spasms -unusually weak or tired Side effects that usually do not require medical attention (report to your doctor or health care professional if they continue or are bothersome): -change in sex drive or performance -depressed mood -diarrhea -difficulty sleeping -headache -menstrual changes -restless or nervous This list may not describe all possible side effects. Call your doctor for medical advice about side effects. You may report side effects to FDA at 1-800-FDA-1088. Where should I keep my medicine? Keep out of the reach of children. Store at room temperature between 20 and 25 degrees C (68 and 77 degrees F). Protect from light. Keep container tightly closed. Throw away any unused medicine after the expiration date. NOTE: This sheet is a summary. It may not cover all possible information. If you have questions about this medicine, talk to your doctor, pharmacist, or health care provider.  2014, [employer]. (2011-05-22 13:04:38)

## 2013-07-08 NOTE — ED Notes (Signed)
Pt refused labs,IV and fluids.MD aware.

## 2013-07-08 NOTE — ED Notes (Signed)
Pt states that she has had N/V/D after starting flagyl and cipro she was prescribed here. Alert and oriented. Ambulatory.

## 2013-07-08 NOTE — ED Notes (Signed)
Patient visualized eating in lobby.

## 2013-08-11 ENCOUNTER — Encounter (HOSPITAL_COMMUNITY): Payer: Self-pay | Admitting: Emergency Medicine

## 2013-08-11 ENCOUNTER — Emergency Department (HOSPITAL_COMMUNITY)
Admission: EM | Admit: 2013-08-11 | Discharge: 2013-08-12 | Disposition: A | Discharge status: Home / Self Care | Payer: 59 | Attending: Emergency Medicine | Admitting: Emergency Medicine

## 2013-08-11 DIAGNOSIS — Z8679 Personal history of other diseases of the circulatory system: Secondary | ICD-10-CM | POA: Insufficient documentation

## 2013-08-11 DIAGNOSIS — Z79899 Other long term (current) drug therapy: Secondary | ICD-10-CM | POA: Insufficient documentation

## 2013-08-11 DIAGNOSIS — B373 Candidiasis of vulva and vagina: Secondary | ICD-10-CM | POA: Insufficient documentation

## 2013-08-11 DIAGNOSIS — R000 Tachycardia, unspecified: Secondary | ICD-10-CM | POA: Insufficient documentation

## 2013-08-11 DIAGNOSIS — N390 Urinary tract infection, site not specified: Secondary | ICD-10-CM

## 2013-08-11 DIAGNOSIS — F17200 Nicotine dependence, unspecified, uncomplicated: Secondary | ICD-10-CM | POA: Insufficient documentation

## 2013-08-11 DIAGNOSIS — Z8742 Personal history of other diseases of the female genital tract: Secondary | ICD-10-CM | POA: Insufficient documentation

## 2013-08-11 DIAGNOSIS — Z3202 Encounter for pregnancy test, result negative: Secondary | ICD-10-CM | POA: Insufficient documentation

## 2013-08-11 DIAGNOSIS — B3731 Acute candidiasis of vulva and vagina: Secondary | ICD-10-CM | POA: Insufficient documentation

## 2013-08-11 DIAGNOSIS — Z880 Allergy status to penicillin: Secondary | ICD-10-CM | POA: Insufficient documentation

## 2013-08-11 DIAGNOSIS — J45909 Unspecified asthma, uncomplicated: Secondary | ICD-10-CM | POA: Insufficient documentation

## 2013-08-11 DIAGNOSIS — Z872 Personal history of diseases of the skin and subcutaneous tissue: Secondary | ICD-10-CM | POA: Insufficient documentation

## 2013-08-11 NOTE — ED Notes (Signed)
Pt complains of urinary frequency and burning for about two days

## 2013-08-12 LAB — URINALYSIS, ROUTINE W REFLEX MICROSCOPIC
BILIRUBIN URINE: NEGATIVE
GLUCOSE, UA: NEGATIVE mg/dL
Ketones, ur: NEGATIVE mg/dL
Nitrite: NEGATIVE
PH: 6 (ref 5.0–8.0)
Protein, ur: 30 mg/dL — AB
SPECIFIC GRAVITY, URINE: 1.029 (ref 1.005–1.030)
Urobilinogen, UA: 0.2 mg/dL (ref 0.0–1.0)

## 2013-08-12 LAB — URINE MICROSCOPIC-ADD ON

## 2013-08-12 LAB — GC/CHLAMYDIA PROBE AMP
CT PROBE, AMP APTIMA: NEGATIVE
GC Probe RNA: NEGATIVE

## 2013-08-12 LAB — POC URINE PREG, ED: Preg Test, Ur: NEGATIVE

## 2013-08-12 LAB — WET PREP, GENITAL: TRICH WET PREP: NONE SEEN

## 2013-08-12 MED ORDER — CEPHALEXIN 500 MG PO CAPS: 500.0000 mg | ORAL_CAPSULE | Freq: Four times a day (QID) | ORAL | Status: DC

## 2013-08-12 MED ORDER — FLUCONAZOLE 150 MG PO TABS
150.0000 mg | ORAL_TABLET | Freq: Once | ORAL | Status: AC
  Administered 2013-08-12: 150 mg via ORAL
  Filled 2013-08-12: qty 1

## 2013-08-12 NOTE — Discharge Instructions (Signed)
1. Medications: keflex, usual home medications 2. Treatment: rest, drink plenty of fluids,  3. Follow Up: Please followup with your primary doctor for discussion of your diagnoses and further evaluation after today's visit; if you do not have a primary care doctor use the resource guide provided to find one;    Urinary Tract Infection Urinary tract infections (UTIs) can develop anywhere along your urinary tract. Your urinary tract is your body's drainage system for removing wastes and extra water. Your urinary tract includes two kidneys, two ureters, a bladder, and a urethra. Your kidneys are a pair of bean-shaped organs. Each kidney is about the size of your fist. They are located below your ribs, one on each side of your spine. CAUSES Infections are caused by microbes, which are microscopic organisms, including fungi, viruses, and bacteria. These organisms are so small that they can only be seen through a microscope. Bacteria are the microbes that most commonly cause UTIs. SYMPTOMS  Symptoms of UTIs may vary by age and gender of the patient and by the location of the infection. Symptoms in young women typically include a frequent and intense urge to urinate and a painful, burning feeling in the bladder or urethra during urination. Older women and men are more likely to be tired, shaky, and weak and have muscle aches and abdominal pain. A fever may mean the infection is in your kidneys. Other symptoms of a kidney infection include pain in your back or sides below the ribs, nausea, and vomiting. DIAGNOSIS To diagnose a UTI, your caregiver will ask you about your symptoms. Your caregiver also will ask to provide a urine sample. The urine sample will be tested for bacteria and white blood cells. White blood cells are made by your body to help fight infection. TREATMENT  Typically, UTIs can be treated with medication. Because most UTIs are caused by a bacterial infection, they usually can be treated with  the use of antibiotics. The choice of antibiotic and length of treatment depend on your symptoms and the type of bacteria causing your infection. HOME CARE INSTRUCTIONS  If you were prescribed antibiotics, take them exactly as your caregiver instructs you. Finish the medication even if you feel better after you have only taken some of the medication.  Drink enough water and fluids to keep your urine clear or pale yellow.  Avoid caffeine, tea, and carbonated beverages. They tend to irritate your bladder.  Empty your bladder often. Avoid holding urine for long periods of time.  Empty your bladder before and after sexual intercourse.  After a bowel movement, women should cleanse from front to back. Use each tissue only once. SEEK MEDICAL CARE IF:   You have back pain.  You develop a fever.  Your symptoms do not begin to resolve within 3 days. SEEK IMMEDIATE MEDICAL CARE IF:   You have severe back pain or lower abdominal pain.  You develop chills.  You have nausea or vomiting.  You have continued burning or discomfort with urination. MAKE SURE YOU:   Understand these instructions.  Will watch your condition.  Will get help right away if you are not doing well or get worse. Document Released: 11/01/2004 Document Revised: 07/24/2011 Document Reviewed: 03/02/2011 [facility] Patient Information 2015 [place], [place]. This information is not intended to replace advice given to you by your health care provider. Make sure you discuss any questions you have with your health care provider.

## 2013-08-12 NOTE — ED Provider Notes (Signed)
CSN: [id number]     Arrival date & time 08/11/13  2337 History   First MD Initiated Contact with Patient 08/11/13 2350     Chief Complaint  Patient presents with  . Urinary Frequency     (Consider location/radiation/quality/duration/timing/severity/associated sxs/prior Treatment) Patient is a 23 y.o. female presenting with frequency. The history is provided by the patient and medical records. No language interpreter was used.  Urinary Frequency Pertinent negatives include no abdominal pain, chest pain, coughing, diaphoresis, fatigue, fever, headaches, nausea, rash or vomiting.    Tamara Garza is a 23 y.o. female  with a hx of asthma, migraine headache presents to the Emergency Department complaining of gradual, persistent, progressively worsening urinary frequency, dysuria, urgency onset 4 days ago, but worsening this morning. Associated symptoms include lower abd discomfort.  Nothing makes it better and nothing makes it worse.  Pt denies fever, chills, headache, neck pain, chest pain, SOB, N/V/D, weakness, dizziness, syncope.   Pt reports increased vaginal discharge with change in color and odor. She reports using [place] [place] body wash but denies vaginal washes. Patient reports she is sexually active with 1 female partner for the last year and denies history of STD.  LMP: 2 weeks ago.     Past Medical History  Diagnosis Date  . 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 Asthma   . Gallstones 2011  . LGSIL (low grade squamous intraepithelial dysplasia)   . RHINITIS, ALLERGIC 04/04/2006    Qualifier: Diagnosis of  By: Abundio MiuMcGregor, Barbara    . ASTHMA, INTERMITTENT 04/04/2006    Qualifier: Diagnosis of  By: Lelon PerlaSaunders MD, Vickki MuffWeston    . ACNE 04/04/2006    Qualifier: Diagnosis of  By: Abundio MiuMcGregor, Barbara    . Migraine 11/21/2011   Past Surgical History  Procedure Laterality Date  . Colposcopy  2009  . Cholecystectomy     Family History  Problem Relation Age of Onset  . Multiple sclerosis Mother   .  Hypertension Mother   . Diabetes Father   . Breast cancer Maternal Grandmother    History  Substance Use Topics  . Smoking status: Current Every Day Smoker -- 0.25 packs/day    Types: Cigarettes  . Smokeless tobacco: Never Used  . Alcohol Use: Yes     Comment: occ.   OB History   Grav Para Term Preterm Abortions TAB SAB Ect Mult Living                 Review of Systems  Constitutional: Negative for fever, diaphoresis, appetite change, fatigue and unexpected weight change.  HENT: Negative for mouth sores.   Eyes: Negative for visual disturbance.  Respiratory: Negative for cough, chest tightness, shortness of breath and wheezing.   Cardiovascular: Negative for chest pain.  Gastrointestinal: Negative for nausea, vomiting, abdominal pain, diarrhea and constipation.  Endocrine: Negative for polydipsia, polyphagia and polyuria.  Genitourinary: Positive for dysuria, urgency and frequency. Negative for hematuria.  Musculoskeletal: Negative for back pain and neck stiffness.  Skin: Negative for rash.  Allergic/Immunologic: Negative for immunocompromised state.  Neurological: Negative for syncope, light-headedness and headaches.  Hematological: Does not bruise/bleed easily.  Psychiatric/Behavioral: Negative for sleep disturbance. The patient is not nervous/anxious.       Allergies  Eggs or egg-derived products; Penicillins; and Sulfonamide derivatives  Home Medications   Prior to Admission medications   Medication Sig Start Date End Date Taking? Authorizing Provider  albuterol (PROVENTIL HFA;VENTOLIN HFA) 108 (90 BASE) MCG/ACT inhaler Inhale 1 puff into the lungs every  6 (six) hours as needed for wheezing or shortness of breath.   Yes Historical Provider, MD  cephALEXin (KEFLEX) 500 MG capsule Take 1 capsule (500 mg total) by mouth 4 (four) times daily. 08/12/13   Ishitha Roper, PA-C   BP 130/85  Pulse 84  Temp(Src) 98.4 F (36.9 C) (Oral)  Resp 18  Ht 5\' 6"  (1.676 m)  Wt  232 lb (105.235 kg)  BMI 37.46 kg/m2  SpO2 100%  LMP 08/05/2013 Physical Exam  Nursing note and vitals reviewed. Constitutional: She appears well-developed and well-nourished. No distress.  Awake, alert, nontoxic appearance  HENT:  Head: Normocephalic and atraumatic.  Mouth/Throat: Oropharynx is clear and moist. No oropharyngeal exudate.  Eyes: Conjunctivae are normal. No scleral icterus.  Neck: Normal range of motion. Neck supple.  Cardiovascular: Normal rate, regular rhythm, normal heart sounds and intact distal pulses.   No murmur heard. Mild tachycardia  Pulmonary/Chest: Effort normal and breath sounds normal. No respiratory distress. She has no wheezes.  Abdominal: Soft. Bowel sounds are normal. She exhibits no distension and no mass. There is no hepatosplenomegaly. There is tenderness in the suprapubic area. There is no rebound, no guarding and no CVA tenderness. Hernia confirmed negative in the right inguinal area and confirmed negative in the left inguinal area.  No CVA tenderness  Genitourinary: Uterus normal. Pelvic exam was performed with patient supine. There is no rash, tenderness or lesion on the right labia. There is no rash, tenderness or lesion on the left labia. Uterus is not deviated, not enlarged, not fixed and not tender. Cervix exhibits no motion tenderness, no discharge and no friability. Right adnexum displays no mass, no tenderness and no fullness. Left adnexum displays no mass, no tenderness and no fullness. No erythema, tenderness or bleeding around the vagina. No foreign body around the vagina. No signs of injury around the vagina. Vaginal discharge (scant, thick, white, nonodorous) found.  Musculoskeletal: Normal range of motion. She exhibits no edema.  Lymphadenopathy:    She has no cervical adenopathy.       Right: No inguinal adenopathy present.       Left: No inguinal adenopathy present.  Neurological: She is alert.  Speech is clear and goal oriented Moves  extremities without ataxia  Skin: Skin is warm and dry. No rash noted. She is not diaphoretic.  Psychiatric: She has a normal mood and affect.    ED Course  Procedures (including critical care time) Labs Review Labs Reviewed  WET PREP, GENITAL - Abnormal; Notable for the following:    Yeast Wet Prep HPF POC FEW (*)    Clue Cells Wet Prep HPF POC RARE (*)    WBC, Wet Prep HPF POC RARE (*)    All other components within normal limits  URINALYSIS, ROUTINE W REFLEX MICROSCOPIC - Abnormal; Notable for the following:    APPearance TURBID (*)    Hgb urine dipstick MODERATE (*)    Protein, ur 30 (*)    Leukocytes, UA LARGE (*)    All other components within normal limits  URINE MICROSCOPIC-ADD ON - Abnormal; Notable for the following:    Bacteria, UA MANY (*)    All other components within normal limits  GC/CHLAMYDIA PROBE AMP  URINE CULTURE  POC URINE PREG, ED    Imaging Review No results found.   EKG Interpretation None      MDM   Final diagnoses:  UTI (lower urinary tract infection)  Vaginal yeast infection    Tamara Garza presents  with dysuria, urinary frequency and urinary urgency. Patient also reports change in her vaginal discharge. Low risk for STD. Pelvic exam appears healthy with minimal amount of non-odorous discharge. UA with evidence of urinary tract infection. Wet prep pending.  1:14 AM Wet prep with rare white blood cells, rare clue cells and a few B cells. We'll treat for vaginal yeast infection and urinary tract infection.  Pt has been diagnosed with a UTI. Pt is afebrile, no CVA tenderness, normotensive, and denies N/V. Pt to be dc home with antibiotics and instructions to follow up with PCP if symptoms persist.  I have personally reviewed patient's vitals, nursing note and any pertinent labs or imaging.  I performed an undressed physical exam.    At this time, it has been determined that no acute conditions requiring further emergency intervention. The  patient/guardian have been advised of the diagnosis and plan. I reviewed all labs and imaging including any potential incidental findings. We have discussed signs and symptoms that warrant return to the ED, such as high fevers, intractable vomiting, worsening pain with urination.  Patient/guardian has voiced understanding and agreed to follow-up with the PCP or specialist in 3 days.  Vital signs are stable at discharge.   BP 130/85  Pulse 84  Temp(Src) 98.4 F (36.9 C) (Oral)  Resp 18  Ht 5\' 6"  (1.676 m)  Wt 232 lb (105.235 kg)  BMI 37.46 kg/m2  SpO2 100%  LMP 08/05/2013           Dierdre Forth, PA-C 08/12/13 0136

## 2013-08-13 LAB — URINE CULTURE

## 2013-08-15 NOTE — ED Provider Notes (Signed)
Medical screening examination/treatment/procedure(s) were performed by non-physician practitioner and as supervising physician I was immediately available for consultation/collaboration.   EKG Interpretation None        Rolland Porter, MD 08/15/13 [phone]

## 2013-11-20 ENCOUNTER — Other Ambulatory Visit: Payer: Self-pay

## 2014-02-11 ENCOUNTER — Ambulatory Visit: Payer: 59 | Admitting: Women's Health

## 2014-02-11 ENCOUNTER — Encounter: Payer: Self-pay | Admitting: Women's Health

## 2014-02-11 ENCOUNTER — Ambulatory Visit (INDEPENDENT_AMBULATORY_CARE_PROVIDER_SITE_OTHER): Payer: 59 | Admitting: Women's Health

## 2014-02-11 VITALS — BP 115/74 | Ht 65.0 in | Wt 224.0 lb

## 2014-02-11 DIAGNOSIS — Z3009 Encounter for other general counseling and advice on contraception: Secondary | ICD-10-CM

## 2014-02-11 DIAGNOSIS — B373 Candidiasis of vulva and vagina: Secondary | ICD-10-CM

## 2014-02-11 DIAGNOSIS — B3731 Acute candidiasis of vulva and vagina: Secondary | ICD-10-CM

## 2014-02-11 LAB — WET PREP FOR TRICH, YEAST, CLUE
CLUE CELLS WET PREP: NONE SEEN
Trich, Wet Prep: NONE SEEN

## 2014-02-11 MED ORDER — NORETHIN ACE-ETH ESTRAD-FE 1-20 MG-MCG(24) PO CHEW: CHEWABLE_TABLET | ORAL | Status: DC

## 2014-02-11 MED ORDER — FLUCONAZOLE 150 MG PO TABS: 150.0000 mg | ORAL_TABLET | Freq: Once | ORAL | Status: DC

## 2014-02-11 NOTE — Patient Instructions (Signed)
Chlamydia Chlamydia is an infection. It is spread from one person to another person during sexual contact. This infection can be in the cervix, urine tube (urethra), throat, or bottom (rectum). This infection needs treatment. HOME CARE   Take your medicines (antibiotics) as told. Finish them even if you start to feel better.  Only take medicine as told by your doctor.  Tell your sex partner(s) that you have chlamydia. They must also be treated.  Do not have sex until your doctor says it is okay.  Rest.  Eat healthy. Drink enough fluids to keep your pee (urine) clear or pale yellow.  Keep all doctor visits as told. GET HELP IF:  You have pain when you pee.  You have belly pain.  You have vaginal discharge.  You have pain during sex.  You have bleeding between periods and after sex.  You have a fever. GET HELP RIGHT AWAY IF:   You feel sick to your stomach (nauseous) or you throw up (vomit).  You sweat much more than normal (diaphoresis).  You have trouble swallowing. MAKE SURE YOU:   Understand these instructions.  Will watch your condition.  Will get help right away if you are not doing well or get worse. Document Released: 11/01/2007 Document Revised: 06/08/2013 Document Reviewed: 09/29/2012 ExitCare Patient Information 2015 [employer]. This information is not intended to replace advice given to you by your health care provider. Make sure you discuss any questions you have with your health care provider.  

## 2014-02-11 NOTE — Progress Notes (Signed)
Presents with discharge and wanting an annual exam. White discharge for 2 weeks with odor and itching. New partner 2 weeks ago, now with old partner since then.Tested positive at urgent care for chlamydia, told it was negative initially, one week later called back and said result was positive, was given a prescription but did not fill it because she had made this appointment. Reports negative HIV, hepatitis and RPR.  LMP 02/11/2014 normal cycle. No contraception used.  Exam: Well appearing. External genitalia normal. Speculum exam scant white discharge, healthy cervix. No cervical tenderness, no adnexal tenderness. Wet prep: Rare yeast  Chlamydia diagnosed at urgent care Yeast vaginitis  Contraception Management   Plan: Diflucan 150 mg PO 1, 1 refill. Instructed to take medication prescribed from urgent care for chlamydial infection. Conception options reviewed, will try. Minastrin 24 FE 1-20, risks of stroke, blood clot risk reviewed, instructed on proper use. Condoms encouraged. Will make an appointment in 2 weeks for annual exam test of cure chlamydia.Marland Kitchen

## 2014-02-11 NOTE — Addendum Note (Signed)
Addended by: Kem Parkinson on: 02/11/2014 11:17 AM   Modules accepted: Orders

## 2014-02-25 ENCOUNTER — Encounter: Payer: Self-pay | Admitting: Women's Health

## 2014-02-25 ENCOUNTER — Other Ambulatory Visit (HOSPITAL_COMMUNITY)
Admission: RE | Admit: 2014-02-25 | Discharge: 2014-02-25 | Disposition: A | Discharge status: Home / Self Care | Payer: 59 | Source: Ambulatory Visit | Attending: Gynecology | Admitting: Gynecology

## 2014-02-25 ENCOUNTER — Ambulatory Visit (INDEPENDENT_AMBULATORY_CARE_PROVIDER_SITE_OTHER): Payer: 59 | Admitting: Women's Health

## 2014-02-25 VITALS — BP 110/80 | Ht 65.0 in | Wt 228.0 lb

## 2014-02-25 DIAGNOSIS — Z01419 Encounter for gynecological examination (general) (routine) without abnormal findings: Secondary | ICD-10-CM | POA: Diagnosis present

## 2014-02-25 DIAGNOSIS — Z113 Encounter for screening for infections with a predominantly sexual mode of transmission: Secondary | ICD-10-CM

## 2014-02-25 DIAGNOSIS — B3731 Acute candidiasis of vulva and vagina: Secondary | ICD-10-CM

## 2014-02-25 DIAGNOSIS — Z202 Contact with and (suspected) exposure to infections with a predominantly sexual mode of transmission: Secondary | ICD-10-CM

## 2014-02-25 DIAGNOSIS — Z30019 Encounter for initial prescription of contraceptives, unspecified: Secondary | ICD-10-CM

## 2014-02-25 DIAGNOSIS — B373 Candidiasis of vulva and vagina: Secondary | ICD-10-CM

## 2014-02-25 LAB — CBC WITH DIFFERENTIAL/PLATELET
Basophils Absolute: 0 10*3/uL (ref 0.0–0.1)
Basophils Relative: 0 % (ref 0–1)
EOS PCT: 5 % (ref 0–5)
Eosinophils Absolute: 0.3 10*3/uL (ref 0.0–0.7)
HCT: 36.7 % (ref 36.0–46.0)
HEMOGLOBIN: 11.7 g/dL — AB (ref 12.0–15.0)
Lymphocytes Relative: 34 % (ref 12–46)
Lymphs Abs: 1.8 10*3/uL (ref 0.7–4.0)
MCH: 28.3 pg (ref 26.0–34.0)
MCHC: 31.9 g/dL (ref 30.0–36.0)
MCV: 88.6 fL (ref 78.0–100.0)
MONOS PCT: 8 % (ref 3–12)
MPV: 10 fL (ref 8.6–12.4)
Monocytes Absolute: 0.4 10*3/uL (ref 0.1–1.0)
NEUTROS ABS: 2.8 10*3/uL (ref 1.7–7.7)
Neutrophils Relative %: 53 % (ref 43–77)
Platelets: 283 10*3/uL (ref 150–400)
RBC: 4.14 MIL/uL (ref 3.87–5.11)
RDW: 13.6 % (ref 11.5–15.5)
WBC: 5.2 10*3/uL (ref 4.0–10.5)

## 2014-02-25 MED ORDER — FLUCONAZOLE 150 MG PO TABS: 150.0000 mg | ORAL_TABLET | Freq: Once | ORAL | Status: DC

## 2014-02-25 MED ORDER — NORETHIN ACE-ETH ESTRAD-FE 1-20 MG-MCG PO TABS: 1.0000 | ORAL_TABLET | Freq: Every day | ORAL | Status: DC

## 2014-02-25 MED ORDER — CEFIXIME 400 MG PO TABS: 400.0000 mg | ORAL_TABLET | Freq: Every day | ORAL | Status: DC

## 2014-02-25 MED ORDER — AZITHROMYCIN 500 MG PO TABS: 1000.0000 mg | ORAL_TABLET | Freq: Every day | ORAL | Status: DC

## 2014-02-25 NOTE — Progress Notes (Signed)
Tamara Garza 06-Aug-1990 [id number]    History:    Presents for annual exam 2 complaints, odor/discharge yeast diagnosed  02/11/14 visit and states wants GC/Chlaymdia screen. Has not been able to pick up Diflucan prescription, still symptomatic. Boyfriend told her he has gonorrhea, has been treated, intercourse prior to diagnosis, has not had intercourse with him after his treatment. Did not start Minastrin from last visit. Irregular cycles, LMP 02/11/2014. Gardasil complete. 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  Past medical history, past surgical history, family history and social history were all reviewed and documented in the EPIC chart. Works in [place]. Breast cancer MGM. Hypertension and MS mother, now in nursing home. Diabetes father.   ROS:  A ROS was performed and pertinent positives and negatives are included.  Exam:  Filed Vitals:   02/25/14 0923  BP: 110/80    General appearance:  Normal Thyroid:  Symmetrical, normal in size, without palpable masses or nodularity. Respiratory  Auscultation:  Clear without wheezing or rhonchi Cardiovascular  Auscultation:  Regular rate, without rubs, murmurs or gallops  Edema/varicosities:  Not grossly evident Abdominal  Soft,nontender, without masses, guarding or rebound.  Liver/spleen:  No organomegaly noted  Hernia:  None appreciated  Skin  Inspection:  Grossly normal   Breasts: Examined lying and sitting.     Right: Without masses, retractions, discharge or axillary adenopathy.     Left: Without masses, retractions, discharge or axillary adenopathy. Gentitourinary   Inguinal/mons:  Normal without inguinal adenopathy  External genitalia:  Normal  BUS/Urethra/Skene's glands:  Normal  Vagina:  Normal, scant white discharge, odor noted  Cervix:  Normal  Uterus:  Nnormal in size, shape and contour.  Midline and mobile  Adnexa/parametria:     Rt: Without masses or tenderness.   Lt: Without masses or tenderness.  Anus and  perineum: Normal    Assessment/Plan:  24 y.o. SBF G0 for annual exam with odor/discharge and need for GC/Chlaymdia screen  STD screen/exposure to gonorrhea Yeast vaginitis  Contraception Management Smoke cessation counseling  Obesity  Plan: Cefixime 400mg  PO x1, azithromycin 1g PO x1. Diflucan 150mg  x1, 1 refill. Call if symptoms persist or worsen. Contraception methods discussed, Loestrin Fe 1-20mg  prescription, risk of blood clot and stroke reviewed. Discussed methods to remember daily pill, condoms encouraged for first month and until permanent partner. Benefits of smoking cessation discussed. SBE's, increase exercise, decrease calories for weight loss, MVI, calcium rich diet encouraged. Last normal pap 2011, pap done today, new screening guidelines reviewed. GC/Chlaymdia, RPR, HIV, Hep C, B, CBC pending.     Harrington Challenger [facility], 9:57 AM 02/25/2014

## 2014-02-25 NOTE — Patient Instructions (Signed)

## 2014-02-26 LAB — GC/CHLAMYDIA PROBE AMP
CT Probe RNA: NEGATIVE
GC Probe RNA: NEGATIVE

## 2014-02-26 LAB — HIV ANTIBODY (ROUTINE TESTING W REFLEX): HIV: NONREACTIVE

## 2014-02-26 LAB — RPR

## 2014-02-26 LAB — HEPATITIS B SURFACE ANTIGEN: HEP B S AG: NEGATIVE

## 2014-02-26 LAB — HEPATITIS C ANTIBODY: HCV Ab: NEGATIVE

## 2014-02-28 ENCOUNTER — Encounter: Payer: Self-pay | Admitting: Women's Health

## 2014-03-02 LAB — CYTOLOGY - PAP

## 2014-09-15 ENCOUNTER — Telehealth: Payer: Self-pay | Admitting: *Deleted

## 2014-09-15 NOTE — Telephone Encounter (Signed)
Pt called c/o cycle for 2 months had only 2 days of no bleeding, using only 2 tampons per day to change. Is sexually active, not using birth control pills. Pt transferred to front desk to schedule OV.

## 2014-09-23 ENCOUNTER — Ambulatory Visit: Payer: 59 | Admitting: Women's Health

## 2014-09-30 ENCOUNTER — Ambulatory Visit (INDEPENDENT_AMBULATORY_CARE_PROVIDER_SITE_OTHER): Payer: 59 | Admitting: Women's Health

## 2014-09-30 ENCOUNTER — Encounter: Payer: Self-pay | Admitting: Women's Health

## 2014-09-30 VITALS — BP 124/80 | Ht 65.0 in | Wt 226.0 lb

## 2014-09-30 DIAGNOSIS — Z113 Encounter for screening for infections with a predominantly sexual mode of transmission: Secondary | ICD-10-CM | POA: Diagnosis not present

## 2014-09-30 DIAGNOSIS — N760 Acute vaginitis: Secondary | ICD-10-CM

## 2014-09-30 DIAGNOSIS — Z30019 Encounter for initial prescription of contraceptives, unspecified: Secondary | ICD-10-CM

## 2014-09-30 DIAGNOSIS — A499 Bacterial infection, unspecified: Secondary | ICD-10-CM

## 2014-09-30 DIAGNOSIS — B9689 Other specified bacterial agents as the cause of diseases classified elsewhere: Secondary | ICD-10-CM

## 2014-09-30 DIAGNOSIS — N926 Irregular menstruation, unspecified: Secondary | ICD-10-CM

## 2014-09-30 LAB — WET PREP FOR TRICH, YEAST, CLUE
Trich, Wet Prep: NONE SEEN
WBC, Wet Prep HPF POC: NONE SEEN
Yeast Wet Prep HPF POC: NONE SEEN

## 2014-09-30 LAB — PREGNANCY, URINE: Preg Test, Ur: NEGATIVE

## 2014-09-30 LAB — TSH: TSH: 1.102 u[IU]/mL (ref 0.350–4.500)

## 2014-09-30 MED ORDER — NORETHIN ACE-ETH ESTRAD-FE 1-20 MG-MCG PO TABS: 1.0000 | ORAL_TABLET | Freq: Every day | ORAL | Status: DC

## 2014-09-30 MED ORDER — METRONIDAZOLE 500 MG PO TABS: 500.0000 mg | ORAL_TABLET | Freq: Two times a day (BID) | ORAL | Status: DC

## 2014-09-30 NOTE — Patient Instructions (Signed)
Bacterial Vaginosis Bacterial vaginosis is an infection of the vagina. It happens when too many of certain germs (bacteria) grow in the vagina. HOME CARE  Take your medicine as told by your doctor.  Finish your medicine even if you start to feel better.  Do not have sex until you finish your medicine and are better.  Tell your sex partner that you have an infection. They should see their doctor for treatment.  Practice safe sex. Use condoms. Have only one sex partner. GET HELP IF:  You are not getting better after 3 days of treatment.  You have more grey fluid (discharge) coming from your vagina than before.  You have more pain than before.  You have a fever. MAKE SURE YOU:   Understand these instructions.  Will watch your condition.  Will get help right away if you are not doing well or get worse. Document Released: 11/01/2007 Document Revised: 11/12/2012 Document Reviewed: 09/03/2012 ExitCare Patient Information 2015 [employer]. This information is not intended to replace advice given to you by your health care provider. Make sure you discuss any questions you have with your health care provider.  

## 2014-09-30 NOTE — Progress Notes (Signed)
Patient ID: Tamara Garza, female   DOB: 05-May-1990, 24 y.o.   MRN: [id number] States had a regular monthly cycle until June, cycle in June lasted until last week. States did have several days with no bleeding during that time. Moved to [facility] from [place] in June for job. Last sexually active in June, new partner.  Has had discharge with odor at times, denies vaginal itching, urinary symptoms or abdominal pain. Condoms for contraception. Would like to get back on OCs.  Exam: Appears well. Abdomen soft, no rebound or radiation of pain. External genitalia within normal limits, speculum exam no blood noted, moderate amount of white discharge with odor noted, wet prep positive for amines, clues, TNTC bacteria. Bimanual no CMT or adnexal tenderness. GC/Chlamydia culture taken. UPT negative.  Bacteria vaginosis STD screen DUB  Plan: MetroGel vaginal cream 1 applicator at bedtime 5, alcohol precautions reviewed. Instructed to call if no relief of discharge. Contraception reviewed, Loestrin 1/20 prescription, proper use given and reviewed slight risk for blood clots and strokes. Reviewed importance of condoms if sexually active. Instructed to call if further irregular bleeding. TSH, prolactin, GC/Chlamydia culture pending, HIV, hep B, C, RPR.

## 2014-10-01 LAB — PROLACTIN: PROLACTIN: 6.3 ng/mL

## 2014-10-01 LAB — HEPATITIS B SURFACE ANTIGEN: Hepatitis B Surface Ag: NEGATIVE

## 2014-10-01 LAB — GC/CHLAMYDIA PROBE AMP
CT Probe RNA: NEGATIVE
GC Probe RNA: NEGATIVE

## 2014-10-01 LAB — HIV ANTIBODY (ROUTINE TESTING W REFLEX): HIV 1&2 Ab, 4th Generation: NONREACTIVE

## 2014-10-01 LAB — HEPATITIS C ANTIBODY: HCV AB: NEGATIVE

## 2014-10-01 LAB — RPR

## 2014-10-04 ENCOUNTER — Telehealth: Payer: Self-pay

## 2014-10-04 MED ORDER — MEDROXYPROGESTERONE ACETATE 10 MG PO TABS: 10.0000 mg | ORAL_TABLET | Freq: Every day | ORAL | Status: DC

## 2014-10-04 NOTE — Telephone Encounter (Signed)
Patient called in voice mail stating she needed to talk to [place].  Harriett Sine is off today. I called her back but phone just rang. No answer after 10 rings and no voice mail. Ph # verified. I will try again later.

## 2014-10-04 NOTE — Telephone Encounter (Signed)
No answer/no voice mail. I will try again later.

## 2014-10-04 NOTE — Telephone Encounter (Signed)
I called and reached patient. She is having heavy bleeding since Friday. At 09/30/14 visit you prescribed OC but she did not start it because this is not like her period. This is heavy.    She said she had bled 2 1/2 mos prior to visit and then had stopped a week until last Friday when this started.  She is not currently sexually active.  I told her I suspected you would want her to start OC if no chance of pregnancy.    She knows [place] off today and it will be tomorrow before we advise.

## 2014-10-04 NOTE — Telephone Encounter (Signed)
No answer again    Will try again later

## 2014-10-04 NOTE — Telephone Encounter (Signed)
Please call and have her take provera 10mg  daily for 10 days which should stop the bleeding, then when next cycle starts start OC's day 1 of bleeding.  Have her call if cycle doesn't stop with the provera. (had neg upt at OV and not active since June)

## 2014-10-16 ENCOUNTER — Other Ambulatory Visit: Payer: Self-pay | Admitting: Women's Health

## 2015-02-15 ENCOUNTER — Emergency Department (HOSPITAL_COMMUNITY)
Admission: EM | Admit: 2015-02-15 | Discharge: 2015-02-16 | Disposition: A | Discharge status: Home / Self Care | Payer: Self-pay | Attending: Emergency Medicine | Admitting: Emergency Medicine

## 2015-02-15 ENCOUNTER — Emergency Department (HOSPITAL_COMMUNITY): Admission: EM | Admit: 2015-02-15 | Discharge: 2015-02-15 | Discharge status: Absent without leave | Payer: Self-pay

## 2015-02-15 ENCOUNTER — Encounter (HOSPITAL_COMMUNITY): Payer: Self-pay | Admitting: *Deleted

## 2015-02-15 ENCOUNTER — Ambulatory Visit: Payer: 59 | Admitting: Women's Health

## 2015-02-15 DIAGNOSIS — Z793 Long term (current) use of hormonal contraceptives: Secondary | ICD-10-CM | POA: Insufficient documentation

## 2015-02-15 DIAGNOSIS — Z8719 Personal history of other diseases of the digestive system: Secondary | ICD-10-CM | POA: Insufficient documentation

## 2015-02-15 DIAGNOSIS — Z79899 Other long term (current) drug therapy: Secondary | ICD-10-CM | POA: Insufficient documentation

## 2015-02-15 DIAGNOSIS — Z87891 Personal history of nicotine dependence: Secondary | ICD-10-CM | POA: Insufficient documentation

## 2015-02-15 DIAGNOSIS — Z872 Personal history of diseases of the skin and subcutaneous tissue: Secondary | ICD-10-CM | POA: Insufficient documentation

## 2015-02-15 DIAGNOSIS — Z880 Allergy status to penicillin: Secondary | ICD-10-CM | POA: Insufficient documentation

## 2015-02-15 DIAGNOSIS — Z3202 Encounter for pregnancy test, result negative: Secondary | ICD-10-CM | POA: Insufficient documentation

## 2015-02-15 DIAGNOSIS — N390 Urinary tract infection, site not specified: Secondary | ICD-10-CM | POA: Insufficient documentation

## 2015-02-15 DIAGNOSIS — J45909 Unspecified asthma, uncomplicated: Secondary | ICD-10-CM | POA: Insufficient documentation

## 2015-02-15 DIAGNOSIS — Z8679 Personal history of other diseases of the circulatory system: Secondary | ICD-10-CM | POA: Insufficient documentation

## 2015-02-15 NOTE — ED Notes (Signed)
Pt c/o urinary frequency, urgency and burning and discomfort when urinates x 2 days; pt states that she constantly feels the urge to urinate; pt states that she has been taking Azo without relief of symptoms

## 2015-02-16 LAB — URINALYSIS, ROUTINE W REFLEX MICROSCOPIC
GLUCOSE, UA: NEGATIVE mg/dL
Hgb urine dipstick: NEGATIVE
KETONES UR: 15 mg/dL — AB
NITRITE: POSITIVE — AB
PH: 5 (ref 5.0–8.0)
PROTEIN: 30 mg/dL — AB
Specific Gravity, Urine: 1.028 (ref 1.005–1.030)

## 2015-02-16 LAB — URINE MICROSCOPIC-ADD ON
RBC / HPF: NONE SEEN RBC/hpf (ref 0–5)
SQUAMOUS EPITHELIAL / LPF: NONE SEEN

## 2015-02-16 LAB — PREGNANCY, URINE: Preg Test, Ur: NEGATIVE

## 2015-02-16 MED ORDER — CEPHALEXIN 500 MG PO CAPS: 500.0000 mg | ORAL_CAPSULE | Freq: Four times a day (QID) | ORAL | Status: DC

## 2015-02-16 MED ORDER — PHENAZOPYRIDINE HCL 200 MG PO TABS: 200.0000 mg | ORAL_TABLET | Freq: Three times a day (TID) | ORAL | Status: DC

## 2015-02-16 MED ORDER — CEPHALEXIN 500 MG PO CAPS
500.0000 mg | ORAL_CAPSULE | Freq: Once | ORAL | Status: AC
  Administered 2015-02-16: 500 mg via ORAL
  Filled 2015-02-16: qty 1

## 2015-02-16 NOTE — ED Provider Notes (Signed)
CSN: [id number]     Arrival date & time 02/15/15  2327 History   First MD Initiated Contact with Patient 02/15/15 2345     Chief Complaint  Patient presents with  . Urinary Tract Infection     (Consider location/radiation/quality/duration/timing/severity/associated sxs/prior Treatment) Patient is a 25 y.o. female presenting with urinary tract infection. The history is provided by the patient. No language interpreter was used.  Urinary Tract Infection Pain quality:  Aching and burning Pain severity:  Mild Onset quality:  Gradual Duration:  2 days Timing:  Constant Progression:  Worsening Chronicity:  New Recent urinary tract infections: no   Relieved by:  Nothing Ineffective treatments:  Phenazopyridine Urinary symptoms: frequent urination   Urinary symptoms comment:  Dysuria Associated symptoms: no fever, no nausea, no vaginal discharge and no vomiting     Past Medical History  Diagnosis Date  . 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 Asthma   . Gallstones 2011  . LGSIL (low grade squamous intraepithelial dysplasia)   . RHINITIS, ALLERGIC 04/04/2006    Qualifier: Diagnosis of  By: Abundio Miu    . ASTHMA, INTERMITTENT 04/04/2006    Qualifier: Diagnosis of  By: Lelon Perla MD, Vickki Muff    . ACNE 04/04/2006    Qualifier: Diagnosis of  By: Abundio Miu    . Migraine 11/21/2011   Past Surgical History  Procedure Laterality Date  . Colposcopy  2009  . Cholecystectomy     Family History  Problem Relation Age of Onset  . Multiple sclerosis Mother   . Hypertension Mother   . Diabetes Father   . Breast cancer Maternal Grandmother    Social History  Substance Use Topics  . Smoking status: Former Smoker -- 0.25 packs/day    Types: Cigarettes  . Smokeless tobacco: Never Used  . Alcohol Use: 0.0 oz/week    0 Standard drinks or equivalent per week     Comment: occ.   OB History    Gravida Para Term Preterm AB TAB SAB Ectopic Multiple Living   0 0 0 0 0 0 0 0 0 0       Review of Systems  Constitutional: Negative for fever.  Respiratory: Negative for shortness of breath.   Gastrointestinal: Negative for nausea and vomiting.  Genitourinary: Positive for dysuria and frequency. Negative for vaginal discharge.      Allergies  Eggs or egg-derived products; Penicillins; and Sulfonamide derivatives  Home Medications   Prior to Admission medications   Medication Sig Start Date End Date Taking? Authorizing Provider  albuterol (PROVENTIL HFA;VENTOLIN HFA) 108 (90 BASE) MCG/ACT inhaler Inhale 1 puff into the lungs every 6 (six) hours as needed for wheezing or shortness of breath.    Historical Provider, MD  medroxyPROGESTERone (PROVERA) 10 MG tablet Take 1 tablet (10 mg total) by mouth daily. 10/04/14   Harrington Challenger, NP  metroNIDAZOLE (FLAGYL) 500 MG tablet Take 1 tablet (500 mg total) by mouth 2 (two) times daily. 09/30/14   Harrington Challenger, NP  norethindrone-ethinyl estradiol (LOESTRIN FE 1/20) 1-20 MG-MCG tablet Take 1 tablet by mouth daily. 09/30/14   Harrington Challenger, NP   BP 119/79 mmHg  Temp(Src) 97.4 F (36.3 C)  Resp 20  SpO2 96%  LMP 01/03/2015 Physical Exam  Constitutional: She is oriented to person, place, and time. She appears well-developed and well-nourished.  Neck: Normal range of motion.  Pulmonary/Chest: Effort normal.  Abdominal: Soft. There is tenderness.  Mild suprapubic tenderness.   Musculoskeletal: Normal range of motion.  Neurological: She is alert and oriented to person, place, and time.  Skin: Skin is warm and dry.  Psychiatric: She has a normal mood and affect.    ED Course  Procedures (including critical care time) Labs Review Labs Reviewed  URINALYSIS, ROUTINE W REFLEX MICROSCOPIC (NOT AT [facility]) - Abnormal; Notable for the following:    Color, Urine BIOCHEMICALS MAY BE AFFECTED BY COLOR (*)    APPearance CLOUDY (*)    Bilirubin Urine SMALL (*)    Ketones, ur 15 (*)    Protein, ur 30 (*)    Nitrite POSITIVE (*)     Leukocytes, UA LARGE (*)    All other components within normal limits  URINE MICROSCOPIC-ADD ON - Abnormal; Notable for the following:    Bacteria, UA FEW (*)    All other components within normal limits  PREGNANCY, URINE    Imaging Review No results found. I have personally reviewed and evaluated these images and lab results as part of my medical decision-making.   EKG Interpretation None      MDM   Final diagnoses:  None    1. UTI  Uncomplicated UTI with bladder symptoms, no fever/vomiting, positive urine. Will treat with PO abx and d/ch home. Return precautions discussed.    Elpidio Anis, PA-C 02/16/15 1478  Derwood Kaplan, MD 02/18/15 [phone]

## 2015-02-16 NOTE — Discharge Instructions (Signed)

## 2015-07-14 ENCOUNTER — Encounter: Payer: Self-pay | Admitting: Women's Health

## 2015-07-14 ENCOUNTER — Ambulatory Visit (INDEPENDENT_AMBULATORY_CARE_PROVIDER_SITE_OTHER): Payer: Self-pay | Admitting: Women's Health

## 2015-07-14 VITALS — BP 124/80 | Ht 65.0 in | Wt 245.0 lb

## 2015-07-14 DIAGNOSIS — N912 Amenorrhea, unspecified: Secondary | ICD-10-CM

## 2015-07-14 LAB — PREGNANCY, URINE: PREG TEST UR: NEGATIVE

## 2015-07-14 NOTE — Progress Notes (Signed)
Patient ID: Tamara Garza, female   DOB: 03-10-1990, 25 y.o.   MRN: [id number] Presents with one home positive UPT and 1 negative UPT. History irregular cycles with normal TSH and prolactin. Last cycle was in April reports common to miss cycles. Has used condoms consistently. Currently without insurance, same partner for 6 months and denies need for STD screening today. Denies vaginal discharge, urinary symptoms, abdominal pain or fever. Has used OCs in the past with continued irregular cycles. Had a rash when used Ortho Evra patch.  Exam: Appears well. UPT negative. Abdomen obese, nontender.  Irregular cycles  Plan: Contraception options reviewed, declines will continue condoms. Strongly encouraged to go to the health department for STD screen. Annual exam due encouraged to schedule. Call or return to the office if no cycle greater than 3 months.

## 2015-08-18 ENCOUNTER — Encounter (HOSPITAL_BASED_OUTPATIENT_CLINIC_OR_DEPARTMENT_OTHER): Payer: Self-pay | Admitting: *Deleted

## 2015-08-18 ENCOUNTER — Emergency Department (HOSPITAL_BASED_OUTPATIENT_CLINIC_OR_DEPARTMENT_OTHER)
Admission: EM | Admit: 2015-08-18 | Discharge: 2015-08-18 | Disposition: A | Discharge status: Home / Self Care | Payer: Medicaid Other | Attending: Physician Assistant | Admitting: Physician Assistant

## 2015-08-18 ENCOUNTER — Emergency Department (HOSPITAL_BASED_OUTPATIENT_CLINIC_OR_DEPARTMENT_OTHER): Payer: Medicaid Other

## 2015-08-18 DIAGNOSIS — O23591 Infection of other part of genital tract in pregnancy, first trimester: Secondary | ICD-10-CM | POA: Diagnosis not present

## 2015-08-18 DIAGNOSIS — Z87891 Personal history of nicotine dependence: Secondary | ICD-10-CM | POA: Diagnosis not present

## 2015-08-18 DIAGNOSIS — J4520 Mild intermittent asthma, uncomplicated: Secondary | ICD-10-CM | POA: Diagnosis not present

## 2015-08-18 DIAGNOSIS — R1031 Right lower quadrant pain: Secondary | ICD-10-CM | POA: Insufficient documentation

## 2015-08-18 DIAGNOSIS — R102 Pelvic and perineal pain: Secondary | ICD-10-CM | POA: Insufficient documentation

## 2015-08-18 DIAGNOSIS — Z3A01 Less than 8 weeks gestation of pregnancy: Secondary | ICD-10-CM | POA: Diagnosis not present

## 2015-08-18 DIAGNOSIS — Z3490 Encounter for supervision of normal pregnancy, unspecified, unspecified trimester: Secondary | ICD-10-CM

## 2015-08-18 DIAGNOSIS — N760 Acute vaginitis: Secondary | ICD-10-CM

## 2015-08-18 DIAGNOSIS — B9689 Other specified bacterial agents as the cause of diseases classified elsewhere: Secondary | ICD-10-CM

## 2015-08-18 DIAGNOSIS — O26891 Other specified pregnancy related conditions, first trimester: Secondary | ICD-10-CM | POA: Diagnosis present

## 2015-08-18 LAB — WET PREP, GENITAL
SPERM: NONE SEEN
TRICH WET PREP: NONE SEEN
Yeast Wet Prep HPF POC: NONE SEEN

## 2015-08-18 LAB — HCG, QUANTITATIVE, PREGNANCY: HCG, BETA CHAIN, QUANT, S: 28798 m[IU]/mL — AB (ref <5)

## 2015-08-18 MED ORDER — METRONIDAZOLE 500 MG PO TABS
500.0000 mg | ORAL_TABLET | Freq: Once | ORAL | Status: DC
Start: 2015-08-18 — End: 2015-08-18

## 2015-08-18 MED ORDER — METRONIDAZOLE 500 MG PO TABS
500.0000 mg | ORAL_TABLET | Freq: Once | ORAL | Status: AC
  Administered 2015-08-18: 500 mg via ORAL
  Filled 2015-08-18: qty 1

## 2015-08-18 MED ORDER — METRONIDAZOLE 500 MG PO TABS: 500.0000 mg | ORAL_TABLET | Freq: Two times a day (BID) | ORAL | Status: DC

## 2015-08-18 NOTE — ED Notes (Signed)
Patient transported to Ultrasound ambulatory.  

## 2015-08-18 NOTE — ED Notes (Signed)
Pt amb to room 6 with quick steady gait in nad. Pt reports llq pain x 2 days ago, no discharge or bleeding, pt states she needs an ultrasound to get dates for her medicaid. Explained to pt and sig other that we do not perform routine ultrasounds here. Pt states "well you are gonna do it today, because I need those dates!" pt is eating chips and drinking soda. Pt asked to remain npo until md exam, pt states "this is bullshit." and reluctantly hands bag of chips to her sig other. Pt states the llq pain is intermittent, sharp, and last occurred early this am.

## 2015-08-18 NOTE — ED Notes (Signed)
Pt back in room from U/s.

## 2015-08-18 NOTE — Discharge Instructions (Signed)
Before [facility] Before your baby arrives it is important to:  Have all of the supplies that you will need to care for your baby.  Know where to go if there is an emergency.  Discuss the baby's arrival with other family members. WHAT SUPPLIES WILL I NEED? It is recommended that you have the following supplies: Large Items  Crib.  Crib mattress.  Rear-facing infant car seat. If possible, have a trained professional check to make sure that it is installed correctly. Feeding  6-8 bottles that are 4-5 oz in size.  6-8 nipples.  Bottle brush.  Sterilizer, or a large pan or kettle with a lid.  A way to boil and cool water.  If you will be breastfeeding:  Breast pump.  Nipple cream.  Nursing bra.  Breast pads.  Breast shields.  If you will be formula feeding:  Formula.  Measuring cups.  Measuring spoons. Bathing  Mild baby soap and baby shampoo.  Petroleum jelly.  Soft cloth towel and washcloth.  Hooded towel.  Cotton balls.  Bath basin. Other Supplies  Rectal thermometer.  Bulb syringe.  Baby wipes or washcloths for diaper changes.  Diaper bag.  Changing pad.  Clothing, including one-piece outfits and pajamas.  Baby nail clippers.  Receiving blankets.  Mattress pad and sheets for the crib.  Night-light for the baby's room.  Baby monitor.  2 or 3 pacifiers.  Either 24-36 cloth diapers and waterproof diaper covers or a box of disposable diapers. You may need to use as many as 10-12 diapers per day. HOW DO I PREPARE FOR AN EMERGENCY? Prepare for an emergency by:  Knowing how to get to the nearest hospital.  Listing the phone numbers of your baby's health care providers near your home phone and in your cell phone. HOW DO I PREPARE MY FAMILY?  Decide how to handle visitors.  If you have other children:  Talk with them about the baby coming home. Ask them how they feel about it.  Read a book together about being a new big  brother or sister.  Find ways to let them help you prepare for the new baby.  Have someone ready to care for them while you are in the hospital.   This information is not intended to replace advice given to you by your health care provider. Make sure you discuss any questions you have with your health care provider.   Document Released: 01/05/2008 Document Revised: 06/08/2014 Document Reviewed: 12/30/2013 Elsevier Interactive Patient Education 2016 Elsevier Inc.  Bacterial Vaginosis Bacterial vaginosis is a vaginal infection that occurs when the normal balance of bacteria in the vagina is disrupted. It results from an overgrowth of certain bacteria. This is the most common vaginal infection in women of childbearing age. Treatment is important to prevent complications, especially in pregnant women, as it can cause a premature delivery. CAUSES  Bacterial vaginosis is caused by an increase in harmful bacteria that are normally present in smaller amounts in the vagina. Several different kinds of bacteria can cause bacterial vaginosis. However, the reason that the condition develops is not fully understood. RISK FACTORS Certain activities or behaviors can put you at an increased risk of developing bacterial vaginosis, including:  Having a new sex partner or multiple sex partners.  Douching.  Using an intrauterine device (IUD) for contraception. Women do not get bacterial vaginosis from toilet seats, bedding, swimming pools, or contact with objects around them. SIGNS AND SYMPTOMS  Some women with bacterial vaginosis  have no signs or symptoms. Common symptoms include:  Grey vaginal discharge.  A fishlike odor with discharge, especially after sexual intercourse.  Itching or burning of the vagina and vulva.  Burning or pain with urination. DIAGNOSIS  Your health care provider will take a medical history and examine the vagina for signs of bacterial vaginosis. A sample of vaginal fluid may  be taken. Your health care provider will look at this sample under a microscope to check for bacteria and abnormal cells. A vaginal pH test may also be done.  TREATMENT  Bacterial vaginosis may be treated with antibiotic medicines. These may be given in the form of a pill or a vaginal cream. A second round of antibiotics may be prescribed if the condition comes back after treatment. Because bacterial vaginosis increases your risk for sexually transmitted diseases, getting treated can help reduce your risk for chlamydia, gonorrhea, HIV, and herpes. HOME CARE INSTRUCTIONS   Only take over-the-counter or prescription medicines as directed by your health care provider.  If antibiotic medicine was prescribed, take it as directed. Make sure you finish it even if you start to feel better.  Tell all sexual partners that you have a vaginal infection. They should see their health care provider and be treated if they have problems, such as a mild rash or itching.  During treatment, it is important that you follow these instructions:  Avoid sexual activity or use condoms correctly.  Do not douche.  Avoid alcohol as directed by your health care provider.  Avoid breastfeeding as directed by your health care provider. SEEK MEDICAL CARE IF:   Your symptoms are not improving after 3 days of treatment.  You have increased discharge or pain.  You have a fever. MAKE SURE YOU:   Understand these instructions.  Will watch your condition.  Will get help right away if you are not doing well or get worse. FOR MORE INFORMATION  Centers for Disease Control and Prevention, Division of STD Prevention: [employer] American Sexual Health Association (ASHA): www.ashastd.org    This information is not intended to replace advice given to you by your health care provider. Make sure you discuss any questions you have with your health care provider.   Document Released: 01/22/2005 Document Revised: 02/12/2014  Document Reviewed: 09/03/2012 Elsevier Interactive Patient Education 2016 Elsevier Inc.  Back Pain in Pregnancy Back pain during pregnancy is common. It happens in about half of all pregnancies. It is important for you and your baby that you remain active during your pregnancy.If you feel that back pain is not allowing you to remain active or sleep well, it is time to see your caregiver. Back pain may be caused by several factors related to changes during your pregnancy.Fortunately, unless you had trouble with your back before your pregnancy, the pain is likely to get better after you deliver. Low back pain usually occurs between the fifth and seventh months of pregnancy. It can, however, happen in the first couple months. Factors that increase the risk of back problems include:   Previous back problems.  Injury to your back.  Having twins or multiple births.  A chronic cough.  Stress.  Job-related repetitive motions.  Muscle or spinal disease in the back.  Family history of back problems, ruptured (herniated) discs, or osteoporosis.  Depression, anxiety, and panic attacks. CAUSES   When you are pregnant, your body produces a hormone called relaxin. This hormonemakes the ligaments connecting the low back and pubic bones more flexible. This flexibility  allows the baby to be delivered more easily. When your ligaments are loose, your muscles need to work harder to support your back. Soreness in your back can come from tired muscles. Soreness can also come from back tissues that are irritated since they are receiving less support.  As the baby grows, it puts pressure on the nerves and blood vessels in your pelvis. This can cause back pain.  As the baby grows and gets heavier during pregnancy, the uterus pushes the stomach muscles forward and changes your center of gravity. This makes your back muscles work harder to maintain good posture. SYMPTOMS  Lumbar pain during pregnancy Lumbar  pain during pregnancy usually occurs at or above the waist in the center of the back. There may be pain and numbness that radiates into your leg or foot. This is similar to low back pain experienced by non-pregnant women. It usually increases with sitting for long periods of time, standing, or repetitive lifting. Tenderness may also be present in the muscles along your upper back. Posterior pelvic pain during pregnancy Pain in the back of the pelvis is more common than lumbar pain in pregnancy. It is a deep pain felt in your side at the waistline, or across the tailbone (sacrum), or in both places. You may have pain on one or both sides. This pain can also go into the buttocks and backs of the upper thighs. Pubic and groin pain may also be present. The pain does not quickly resolve with rest, and morning stiffness may also be present. Pelvic pain during pregnancy can be brought on by most activities. A high level of fitness before and during pregnancy may or may not prevent this problem. Labor pain is usually 1 to 2 minutes apart, lasts for about 1 minute, and involves a bearing down feeling or pressure in your pelvis. However, if you are at term with the pregnancy, constant low back pain can be the beginning of early labor, and you should be aware of this. DIAGNOSIS  X-rays of the back should not be done during the first 12 to 14 weeks of the pregnancy and only when absolutely necessary during the rest of the pregnancy. MRIs do not give off radiation and are safe during pregnancy. MRIs also should only be done when absolutely necessary. HOME CARE INSTRUCTIONS  Exercise as directed by your caregiver. Exercise is the most effective way to prevent or manage back pain. If you have a back problem, it is especially important to avoid sports that require sudden body movements. Swimming and walking are great activities.  Do not stand in one place for long periods of time.  Do not wear high heels.  Sit in  chairs with good posture. Use a pillow on your lower back if necessary. Make sure your head rests over your shoulders and is not hanging forward.  Try sleeping on your side, preferably the left side, with a pillow or two between your legs. If you are sore after a night's rest, your bedmay betoo soft.Try placing a board between your mattress and box spring.  Listen to your body when lifting.If you are experiencing pain, ask for help or try bending yourknees more so you can use your leg muscles rather than your back muscles. Squat down when picking up something from the floor. Do not bend over.  Eat a healthy diet. Try to gain weight within your caregiver's recommendations.  Use heat or cold packs 3 to 4 times a day for 15 minutes  to help with the pain.  Only take over-the-counter or prescription medicines for pain, discomfort, or fever as directed by your caregiver. Sudden (acute) back pain  Use bed rest for only the most extreme, acute episodes of back pain. Prolonged bed rest over 48 hours will aggravate your condition.  Ice is very effective for acute conditions.  Put ice in a plastic bag.  Place a towel between your skin and the bag.  Leave the ice on for 10 to 20 minutes every 2 hours, or as needed.  Using heat packs for 30 minutes prior to activities is also helpful. Continued back pain See your caregiver if you have continued problems. Your caregiver can help or refer you for appropriate physical therapy. With conditioning, most back problems can be avoided. Sometimes, a more serious issue may be the cause of back pain. You should be seen right away if new problems seem to be developing. Your caregiver may recommend:  A maternity girdle.  An elastic sling.  A back brace.  A massage therapist or acupuncture. SEEK MEDICAL CARE IF:   You are not able to do most of your daily activities, even when taking the pain medicine you were given.  You need a referral to a physical  therapist or chiropractor.  You want to try acupuncture. SEEK IMMEDIATE MEDICAL CARE IF:  You develop numbness, tingling, weakness, or problems with the use of your arms or legs.  You develop severe back pain that is no longer relieved with medicines.  You have a sudden change in bowel or bladder control.  You have increasing pain in other areas of the body.  You develop shortness of breath, dizziness, or fainting.  You develop nausea, vomiting, or sweating.  You have back pain which is similar to labor pains.  You have back pain along with your water breaking or vaginal bleeding.  You have back pain or numbness that travels down your leg.  Your back pain developed after you fell.  You develop pain on one side of your back. You may have a kidney stone.  You see blood in your urine. You may have a bladder infection or kidney stone.  You have back pain with blisters. You may have shingles. Back pain is fairly common during pregnancy but should not be accepted as just part of the process. Back pain should always be treated as soon as possible. This will make your pregnancy as pleasant as possible.   This information is not intended to replace advice given to you by your health care provider. Make sure you discuss any questions you have with your health care provider.   Document Released: 05/02/2005 Document Revised: 04/16/2011 Document Reviewed: 06/13/2010 Elsevier Interactive Patient Education [employer]

## 2015-08-18 NOTE — ED Provider Notes (Addendum)
CSN: [id number]     Arrival date & time 08/18/15  1324 History   First MD Initiated Contact with Patient 08/18/15 [id number]     Chief Complaint  Patient presents with  . Abdominal Pain     (Consider location/radiation/quality/duration/timing/severity/associated sxs/prior Treatment) HPI   Patient is a 25 year old female presenting with positive pregnancy. Patient reports that she took a couple of tests at home which are positive she went to the health department and also had a positive test. She reports that now she is developing right lower quadrant pain. She states that this is sharp in nature and happened this morning. She is requesting a transvaginal ultrasound at this time. Patient said she "had some discharge". She was unable to quantify or qualify all by any more distinctly.  Past Medical History  Diagnosis Date  . 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 Asthma   . Gallstones 2011  . LGSIL (low grade squamous intraepithelial dysplasia)   . RHINITIS, ALLERGIC 04/04/2006    Qualifier: Diagnosis of  By: Abundio Miu    . ASTHMA, INTERMITTENT 04/04/2006    Qualifier: Diagnosis of  By: Lelon Perla MD, Vickki Muff    . ACNE 04/04/2006    Qualifier: Diagnosis of  By: Abundio Miu    . Migraine 11/21/2011   Past Surgical History  Procedure Laterality Date  . Colposcopy  2009  . Cholecystectomy     Family History  Problem Relation Age of Onset  . Multiple sclerosis Mother   . Hypertension Mother   . Diabetes Father   . Breast cancer Maternal Grandmother    Social History  Substance Use Topics  . Smoking status: Former Smoker -- 0.25 packs/day    Types: Cigarettes  . Smokeless tobacco: Never Used  . Alcohol Use: 0.0 oz/week    0 Standard drinks or equivalent per week     Comment: occ.   OB History    Gravida Para Term Preterm AB TAB SAB Ectopic Multiple Living       Review of Systems  Constitutional: Negative for activity change.  HENT: Negative for  congestion.   Respiratory: Negative for shortness of breath.   Cardiovascular: Negative for chest pain.  Gastrointestinal: Negative for abdominal pain.  Genitourinary: Positive for vaginal discharge, menstrual problem and pelvic pain. Negative for dysuria.  Musculoskeletal: Negative for back pain.  Neurological: Negative for dizziness.  Psychiatric/Behavioral: Negative for behavioral problems and agitation.      Allergies  Eggs or egg-derived products; Penicillins; and Sulfonamide derivatives  Home Medications   Prior to Admission medications   Medication Sig Start Date End Date Taking? Authorizing Provider  albuterol (PROVENTIL HFA;VENTOLIN HFA) 108 (90 BASE) MCG/ACT inhaler Inhale 1 puff into the lungs every 6 (six) hours as needed for wheezing or shortness of breath.    Historical Provider, MD  metroNIDAZOLE (FLAGYL) 500 MG tablet Take 1 tablet (500 mg total) by mouth 2 (two) times daily. 08/18/15   Lylee Corrow Lyn Keegen Heffern, MD   BP 115/69 mmHg  Pulse 94  Temp(Src) 98.6 F (37 C) (Oral)  Resp 20  Ht  (1.651 m)  Wt 240 lb (108.863 kg)  BMI 39.94 kg/m2  SpO2 100%  LMP  (LMP Unknown) Physical Exam  Constitutional: She is oriented to person, place, and time. She appears well-developed and well-nourished.  HENT:  Head: Normocephalic and atraumatic.  Eyes: Conjunctivae are normal. Right eye exhibits no discharge.  Neck: Neck supple.  Cardiovascular:  Normal rate, regular rhythm and normal heart sounds.   No murmur heard. Pulmonary/Chest: Effort normal and breath sounds normal. She has no wheezes. She has no rales.  Abdominal: Soft. She exhibits no distension. There is no tenderness.  Genitourinary: Vagina normal.  No CMT, no discharge  Musculoskeletal: Normal range of motion. She exhibits no edema.  Neurological: She is oriented to person, place, and time. No cranial nerve deficit.  Skin: Skin is warm and dry. No rash noted. She is not diaphoretic.  Psychiatric: She has a  normal mood and affect. Her behavior is normal.  Nursing note and vitals reviewed.   ED Course  Procedures (including critical care time) Labs Review Labs Reviewed  WET PREP, GENITAL - Abnormal; Notable for the following:    Clue Cells Wet Prep HPF POC PRESENT (*)    WBC, Wet Prep HPF POC FEW (*)    All other components within normal limits  HCG, QUANTITATIVE, PREGNANCY - Abnormal; Notable for the following:    hCG, Beta Chain, [place] [id number] (*)    All other components within normal limits  RPR  HIV ANTIBODY (ROUTINE TESTING)  GC/CHLAMYDIA PROBE AMP ([place]) NOT AT [facility]    Imaging Review US Ob Comp Less 14 Wks  08/18/2015  CLINICAL DATA:  History of multiple positive home pregnancy test; right lower quadrant pain for the past 2 days, un known dates EXAM: OBSTETRIC <14 WK [place] AND TRANSVAGINAL OB US TECHNIQUE: Both transabdominal and transvaginal ultrasound examinations were performed for complete evaluation of the gestation as well as the maternal uterus, adnexal regions, and pelvic cul-de-sac. Transvaginal technique was performed to assess early pregnancy. COMPARISON:  None in PACs FINDINGS: Intrauterine gestational sac: Present, single Yolk sac:  Present Embryo:  Present Cardiac Activity: Present Heart Rate: 143  bpm CRL: 10.2 mm 7 w 1 d [place] EDC: April 04, 2016 Subchorionic hemorrhage:  None visualized. Maternal uterus/adnexae: The maternal ovaries were not visualized. IMPRESSION: Single viable IUP with estimated gestational age of [redacted] weeks 1 day. Estimated date of delivery is April 04, 2016. No subchorionic hemorrhage is observed. Electronically Signed   By: David  [place] M.D.   On: 08/18/2015 12:03   US Ob Transvaginal  08/18/2015  CLINICAL DATA:  History of multiple positive home pregnancy test; right lower quadrant pain for the past 2 days, un known dates EXAM: OBSTETRIC <14 WK [place] AND TRANSVAGINAL OB US TECHNIQUE: Both transabdominal and transvaginal ultrasound examinations  were performed for complete evaluation of the gestation as well as the maternal uterus, adnexal regions, and pelvic cul-de-sac. Transvaginal technique was performed to assess early pregnancy. COMPARISON:  None in PACs FINDINGS: Intrauterine gestational sac: Present, single Yolk sac:  Present Embryo:  Present Cardiac Activity: Present Heart Rate: 143  bpm CRL: 10.2 mm 7 w 1 d [place] EDC: April 04, 2016 Subchorionic hemorrhage:  None visualized. Maternal uterus/adnexae: The maternal ovaries were not visualized. IMPRESSION: Single viable IUP with estimated gestational age of [redacted] weeks 1 day. Estimated date of delivery is April 04, 2016. No subchorionic hemorrhage is observed. Electronically Signed   By: David  [place] M.D.   On: 08/18/2015 12:03   I have personally reviewed and evaluated these images and lab results as part of my medical decision-making.   EKG Interpretation None      MDM   Final diagnoses:  BV (bacterial vaginosis)  Pregnancy    Patient is a 25 year old female with home positive pregnancy test and RLQ intermittent pain. Patient  requesting TVUS.  Will do mannual exam and then TVUS to rule out ectopic given pain.    TVUS shows [redacted] week gestation. IUP.   BV on wet prep. Will treat with flagyl due to preterm labor risk. (discussed with patient)  She will follow up with PCP.    Kaori Jumper Randall An, MD 08/18/15 1211  Tomeca Helm Randall An, MD 08/18/15 1212

## 2015-08-19 LAB — GC/CHLAMYDIA PROBE AMP (~~LOC~~) NOT AT ARMC
CHLAMYDIA, DNA PROBE: NEGATIVE
Neisseria Gonorrhea: NEGATIVE

## 2015-08-19 LAB — RPR: RPR Ser Ql: NONREACTIVE

## 2015-08-19 LAB — HIV ANTIBODY (ROUTINE TESTING W REFLEX): HIV Screen 4th Generation wRfx: NONREACTIVE

## 2015-08-23 ENCOUNTER — Telehealth: Payer: Self-pay | Admitting: *Deleted

## 2015-08-23 NOTE — Telephone Encounter (Signed)
Pt called requesting to speak with you only, states she is pregnant and would like to speak with you about this. Refused to give me any other information. Her call # [phone], I told pt you are seeing patients, but I will relay.

## 2015-08-24 NOTE — Telephone Encounter (Signed)
Telephone call, states had an ultrasound at the health department is 7 weeks questions where she should get prenatal care. Will call [facility]. She has Medicaid.

## 2015-09-07 ENCOUNTER — Other Ambulatory Visit (HOSPITAL_BASED_OUTPATIENT_CLINIC_OR_DEPARTMENT_OTHER): Payer: Self-pay | Admitting: Emergency Medicine

## 2015-09-07 ENCOUNTER — Ambulatory Visit (HOSPITAL_BASED_OUTPATIENT_CLINIC_OR_DEPARTMENT_OTHER)
Admit: 2015-09-07 | Discharge: 2015-09-07 | Disposition: A | Discharge status: Home / Self Care | Payer: Medicaid Other | Attending: Emergency Medicine | Admitting: Emergency Medicine

## 2015-09-07 ENCOUNTER — Encounter (HOSPITAL_BASED_OUTPATIENT_CLINIC_OR_DEPARTMENT_OTHER): Payer: Self-pay | Admitting: *Deleted

## 2015-09-07 ENCOUNTER — Encounter (HOSPITAL_BASED_OUTPATIENT_CLINIC_OR_DEPARTMENT_OTHER): Payer: Self-pay

## 2015-09-07 ENCOUNTER — Emergency Department (HOSPITAL_BASED_OUTPATIENT_CLINIC_OR_DEPARTMENT_OTHER)
Admission: EM | Admit: 2015-09-07 | Discharge: 2015-09-07 | Disposition: A | Discharge status: Home / Self Care | Payer: Medicaid Other | Attending: Emergency Medicine | Admitting: Emergency Medicine

## 2015-09-07 DIAGNOSIS — O209 Hemorrhage in early pregnancy, unspecified: Secondary | ICD-10-CM

## 2015-09-07 DIAGNOSIS — Z87891 Personal history of nicotine dependence: Secondary | ICD-10-CM | POA: Diagnosis not present

## 2015-09-07 DIAGNOSIS — Z3A10 10 weeks gestation of pregnancy: Secondary | ICD-10-CM | POA: Insufficient documentation

## 2015-09-07 DIAGNOSIS — J45909 Unspecified asthma, uncomplicated: Secondary | ICD-10-CM | POA: Insufficient documentation

## 2015-09-07 DIAGNOSIS — Z79899 Other long term (current) drug therapy: Secondary | ICD-10-CM | POA: Insufficient documentation

## 2015-09-07 DIAGNOSIS — O200 Threatened abortion: Secondary | ICD-10-CM | POA: Diagnosis not present

## 2015-09-07 DIAGNOSIS — O4691 Antepartum hemorrhage, unspecified, first trimester: Secondary | ICD-10-CM | POA: Insufficient documentation

## 2015-09-07 DIAGNOSIS — N939 Abnormal uterine and vaginal bleeding, unspecified: Secondary | ICD-10-CM | POA: Diagnosis present

## 2015-09-07 DIAGNOSIS — Z3A09 9 weeks gestation of pregnancy: Secondary | ICD-10-CM | POA: Insufficient documentation

## 2015-09-07 DIAGNOSIS — J4520 Mild intermittent asthma, uncomplicated: Secondary | ICD-10-CM | POA: Insufficient documentation

## 2015-09-07 DIAGNOSIS — O208 Other hemorrhage in early pregnancy: Secondary | ICD-10-CM | POA: Diagnosis present

## 2015-09-07 LAB — ABO/RH: ABO/RH(D): O POS

## 2015-09-07 LAB — HCG, QUANTITATIVE, PREGNANCY: HCG, BETA CHAIN, QUANT, S: 96763 m[IU]/mL — AB (ref <5)

## 2015-09-07 NOTE — ED Provider Notes (Signed)
[facility] Provider Note   CSN: [id number] Arrival date & time: 09/07/15  8657  First Provider Contact:  1:39 AM       History   Chief Complaint Chief Complaint  Patient presents with  . Vaginal Bleeding    HPI Tamara Garza is a 25 y.o. female who is pregnant. She had an ultrasound on July 17 that showed a 7 week IUP and a quantitative hCG at that time was 28,798. She is here with vaginal bleeding that began about 15 minutes prior to arrival. She is unable to quantify how much bleeding. There is associated right-sided pelvic cramping. There are no specific mitigating or exacerbating factors.  HPI  Past Medical History:  Diagnosis Date  . ACNE 04/04/2006   Qualifier: Diagnosis of  By: Abundio Miu    . Asthma   . ASTHMA, INTERMITTENT 04/04/2006   Qualifier: Diagnosis of  By: Lelon Perla MD, Vickki Muff    . Gallstones 2011  . 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 LGSIL (low grade squamous intraepithelial dysplasia)   . Migraine 11/21/2011  . RHINITIS, ALLERGIC 04/04/2006   Qualifier: Diagnosis of  By: Abundio Miu      Patient Active Problem List   Diagnosis Date Noted  . Migraine 11/21/2011  . Back pain 11/21/2011  . 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 Left breast mass 11/21/2011  . Preventative health care 11/10/2011  . LGSIL (low grade squamous intraepithelial dysplasia)   . 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 TOBACCO USER 10/23/2008  . OBESITY, NOS 04/04/2006  . HEARING LOSS NOS OR DEAFNESS 04/04/2006  . RHINITIS, ALLERGIC 04/04/2006  . ASTHMA, INTERMITTENT 04/04/2006  . ACNE 04/04/2006    Past Surgical History:  Procedure Laterality Date  . CHOLECYSTECTOMY    . COLPOSCOPY  2009    OB History    Gravida Para Term Preterm AB Living   1 0 0 0 0 0   SAB TAB Ectopic Multiple Live Births   0 0 0 0         Home Medications    Prior to Admission medications   Medication Sig Start Date End Date Taking? Authorizing Provider  albuterol  (PROVENTIL HFA;VENTOLIN HFA) 108 (90 BASE) MCG/ACT inhaler Inhale 1 puff into the lungs every 6 (six) hours as needed for wheezing or shortness of breath.   Yes Historical Provider, MD  metroNIDAZOLE (FLAGYL) 500 MG tablet Take 1 tablet (500 mg total) by mouth 2 (two) times daily. 08/18/15   Courteney Lyn Mackuen, MD    Family History Family History  Problem Relation Age of Onset  . Multiple sclerosis Mother   . Hypertension Mother   . Diabetes Father   . Breast cancer Maternal Grandmother     Social History Social History  Substance Use Topics  . Smoking status: Former Smoker    Packs/day: 0.25    Types: Cigarettes  . Smokeless tobacco: Never Used  . Alcohol use 0.0 oz/week     Comment: occ.     Allergies   Eggs or egg-derived products; Penicillins; and Sulfonamide derivatives   Review of Systems Review of Systems  All other systems reviewed and are negative.    Physical Exam Updated Vital Signs BP 129/88 (BP Location: Right Arm)   Pulse 92   Temp 98.6 F (37 C) (Oral)   Resp 18   Ht  (1.651 m)   Wt 240 lb (108.9 kg)   LMP  (LMP Unknown)   SpO2  100%   BMI 39.94 kg/m   Physical Exam General: Well-developed, well-nourished female in no acute distress; appearance consistent with age of record HENT: normocephalic; atraumatic Eyes: pupils equal, round and reactive to light; extraocular muscles intact Neck: supple Heart: regular rate and rhythm Lungs: clear to auscultation bilaterally Abdomen: soft; nondistended; Right suprapubic tenderness; no masses or hepatosplenomegaly; bowel sounds present GU: Normal external genitalia; cervical os closed; small amount of vaginal bleeding; no vaginal discharge; right adnexal tenderness Extremities: No deformity; full range of motion; pulses normal Neurologic: Awake, alert and oriented; motor function intact in all extremities and symmetric; no facial droop Skin: Warm and dry Psychiatric: Normal mood and  affect    ED Treatments / Results  Nursing notes and vitals signs, including pulse oximetry, reviewed.  Summary of this visit's results, reviewed by myself:  Labs:  Results for orders placed or performed during the hospital encounter of 09/07/15 (from the past 24 hour(s))  hCG, quantitative, pregnancy     Status: Abnormal   Collection Time: 09/07/15  1:23 AM  Result Value Ref Range   hCG, Beta Chain, Quant, S 96,763 (H) <5 mIU/mL  ABO/Rh     Status: None   Collection Time: 09/07/15  1:23 AM  Result Value Ref Range   ABO/RH(D) O POS Performed at [facility]       Procedures (including critical care time)   Final Clinical Impressions(s) / ED Diagnoses  IUP with heart beat seen on bedside ultrasound. We'll schedule patient for formal ultrasound later today.  Final diagnoses:  Threatened miscarriage      Paula Libra, MD 09/07/15 [phone]

## 2015-09-07 NOTE — ED Notes (Signed)
Pt verbalizes understanding of d/c instructions and denies any further needs at this time. 

## 2015-09-07 NOTE — ED Triage Notes (Signed)
Pt states she started cramping and bleeding about 15 minutes ago, she is unsure about how much, had an US showing a viable IUP two weeks ago.  She is G1P0

## 2015-09-07 NOTE — ED Triage Notes (Signed)
Pt c/o vaginal bleeding x 2 days, seen here this am for same, US done, and returns now for same

## 2015-09-08 ENCOUNTER — Encounter (HOSPITAL_BASED_OUTPATIENT_CLINIC_OR_DEPARTMENT_OTHER): Payer: Self-pay | Admitting: Emergency Medicine

## 2015-09-08 ENCOUNTER — Emergency Department (HOSPITAL_BASED_OUTPATIENT_CLINIC_OR_DEPARTMENT_OTHER)
Admission: EM | Admit: 2015-09-08 | Discharge: 2015-09-08 | Disposition: A | Discharge status: Home / Self Care | Payer: Medicaid Other | Attending: Emergency Medicine | Admitting: Emergency Medicine

## 2015-09-08 DIAGNOSIS — O4690 Antepartum hemorrhage, unspecified, unspecified trimester: Secondary | ICD-10-CM

## 2015-09-08 DIAGNOSIS — N939 Abnormal uterine and vaginal bleeding, unspecified: Secondary | ICD-10-CM

## 2015-09-08 NOTE — ED Provider Notes (Signed)
[facility] Note   CSN: [id number] Arrival date & time: 09/07/15  2316  First Provider Contact:  First MD Initiated Contact with Patient 09/08/15 0136        History   Chief Complaint Chief Complaint  Patient presents with  . Vaginal Bleeding    HPI Tamara Garza is a 25 y.o. female.  The history is provided by the patient.  Vaginal Bleeding  Primary symptoms include vaginal bleeding.  Primary symptoms include no genital pain, no genital rash, no genital odor. There has been no fever. This is a recurrent problem. The current episode started 3 to 5 hours ago. The problem occurs constantly. The problem has not changed since onset.The symptoms occur spontaneously. She is pregnant. Pertinent negatives include no anorexia and no abdominal pain. She has tried nothing for the symptoms. She uses nothing for contraception. Associated medical issues do not include STD.  This patient has been seen multiple times for this problem.  Most recently within the past 24 hours by Dr. Read Drivers and had a second ultrasound within the past 8 hours confirming normal IUP and has had increasing beta hcg.  She has not followed up with OB and returns because she wants to know why this is happening  Past Medical History:  Diagnosis Date  . ACNE 04/04/2006   Qualifier: Diagnosis of  By: Abundio Miu    . Asthma   . ASTHMA, INTERMITTENT 04/04/2006   Qualifier: Diagnosis of  By: Lelon Perla MD, Vickki Muff    . Gallstones 2011  . 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 LGSIL (low grade squamous intraepithelial dysplasia)   . Migraine 11/21/2011  . RHINITIS, ALLERGIC 04/04/2006   Qualifier: Diagnosis of  By: Abundio Miu      Patient Active Problem List   Diagnosis Date Noted  . Migraine 11/21/2011  . Back pain 11/21/2011  . 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 Left breast mass 11/21/2011  . Preventative health care 11/10/2011  . LGSIL (low grade squamous intraepithelial  dysplasia)   . 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 TOBACCO USER 10/23/2008  . OBESITY, NOS 04/04/2006  . HEARING LOSS NOS OR DEAFNESS 04/04/2006  . RHINITIS, ALLERGIC 04/04/2006  . ASTHMA, INTERMITTENT 04/04/2006  . ACNE 04/04/2006    Past Surgical History:  Procedure Laterality Date  . CHOLECYSTECTOMY    . COLPOSCOPY  2009    OB History    Gravida Para Term Preterm AB Living   1 0 0 0 0 0   SAB TAB Ectopic Multiple Live Births   0 0 0 0         Home Medications    Prior to Admission medications   Medication Sig Start Date End Date Taking? Authorizing Provider  albuterol (PROVENTIL HFA;VENTOLIN HFA) 108 (90 BASE) MCG/ACT inhaler Inhale 1 puff into the lungs every 6 (six) hours as needed for wheezing or shortness of breath.    Historical Provider, MD    Family History Family History  Problem Relation Age of Onset  . Multiple sclerosis Mother   . Hypertension Mother   . Diabetes Father   . Breast cancer Maternal Grandmother     Social History Social History  Substance Use Topics  . Smoking status: Former Smoker    Packs/day: 0.25    Types: Cigarettes  . Smokeless tobacco: Never Used  . Alcohol use 0.0 oz/week     Comment: occ.     Allergies   Eggs or egg-derived products;  Penicillins; and Sulfonamide derivatives   Review of Systems Review of Systems  Gastrointestinal: Negative for abdominal pain and anorexia.  Genitourinary: Positive for vaginal bleeding. Negative for vaginal discharge and vaginal pain.  All other systems reviewed and are negative.    Physical Exam Updated Vital Signs BP (!) 92/53 (BP Location: Right Arm)   Pulse 87   Temp 98.2 F (36.8 C)   Resp 16   LMP  (LMP Unknown)   SpO2 100%   Physical Exam  Constitutional: She is oriented to person, place, and time. She appears well-developed and well-nourished.  HENT:  Head: Normocephalic and atraumatic.  Eyes: EOM are normal. Pupils are equal, round, and reactive to light.  Neck: Normal  range of motion. Neck supple.  Cardiovascular: Normal rate and intact distal pulses.   Pulmonary/Chest: Effort normal and breath sounds normal. No respiratory distress.  Abdominal: Soft. Bowel sounds are normal.  Musculoskeletal: Normal range of motion.  Neurological: She is alert and oriented to person, place, and time. She has normal reflexes.  Skin: Skin is warm and dry. Capillary refill takes less than 2 seconds.     ED Treatments / Results  Labs (all labs ordered are listed, but only abnormal results are displayed) Labs Reviewed - No data to display  EKG  EKG Interpretation None       Radiology US Ob Comp Less 14 Wks  Result Date: 09/07/2015 CLINICAL DATA:  25 year old female with 1 day of vaginal bleeding in the first trimester of pregnancy. Initial encounter. Quantitative beta HCG 28,798. Estimated gestational age by ultrasound in July 10 weeks 0 days. EXAM: OBSTETRIC <14 WK [place] AND TRANSVAGINAL OB US TECHNIQUE: Both transabdominal and transvaginal ultrasound examinations were performed for complete evaluation of the gestation as well as the maternal uterus, adnexal regions, and pelvic cul-de-sac. Transvaginal technique was performed to assess early pregnancy. COMPARISON:  08/18/2015 FINDINGS: Intrauterine gestational sac: Single Yolk sac:  Visible Embryo:  Visible Cardiac Activity: Detected Heart Rate: 165  bpm CRL:  30.6  mm   9 w   6 d                  [place] EDC: 04/05/2016 Subchorionic hemorrhage:  None visualized. Maternal uterus/adnexae: Both ovaries appear normal. The left ovary is only visualized transabdominally. There is trace fluid in the cervix. No pelvic free fluid. IMPRESSION: Single living IUP demonstrated. No acute maternal findings visualized. Electronically Signed   By: Odessa Fleming M.D.   On: 09/07/2015 18:16   US Ob Transvaginal  Result Date: 09/07/2015 CLINICAL DATA:  25 year old female with 1 day of vaginal bleeding in the first trimester of pregnancy. Initial  encounter. Quantitative beta HCG 28,798. Estimated gestational age by ultrasound in July 10 weeks 0 days. EXAM: OBSTETRIC <14 WK [place] AND TRANSVAGINAL OB US TECHNIQUE: Both transabdominal and transvaginal ultrasound examinations were performed for complete evaluation of the gestation as well as the maternal uterus, adnexal regions, and pelvic cul-de-sac. Transvaginal technique was performed to assess early pregnancy. COMPARISON:  08/18/2015 FINDINGS: Intrauterine gestational sac: Single Yolk sac:  Visible Embryo:  Visible Cardiac Activity: Detected Heart Rate: 165  bpm CRL:  30.6  mm   9 w   6 d                  [place] EDC: 04/05/2016 Subchorionic hemorrhage:  None visualized. Maternal uterus/adnexae: Both ovaries appear normal. The left ovary is only visualized transabdominally. There is trace fluid in the cervix. No pelvic free  fluid. IMPRESSION: Single living IUP demonstrated. No acute maternal findings visualized. Electronically Signed   By: Odessa Fleming M.D.   On: 09/07/2015 18:16    Procedures Procedures (including critical care time)  Medications Ordered in ED Medications - No data to display   Initial Impression / Assessment and Plan / ED Course  I have reviewed the triage vital signs and the nursing notes.  Pertinent labs & imaging results that were available during my care of the patient were reviewed by me and considered in my medical decision making (see chart for details).  Clinical Course    Vitals:   09/07/15 2324 09/08/15 0136  BP: 119/69 (!) 92/53  Pulse: 96 87  Resp: 16 16  Temp: 98.2 F (36.8 C)    Results for orders placed or performed during the hospital encounter of 09/07/15  hCG, quantitative, pregnancy  Result Value Ref Range   hCG, Beta Chain, Quant, S 96,763 (H) <5 mIU/mL  ABO/Rh  Result Value Ref Range   ABO/RH(D) O POS    No rh immune globuloin      NOT A RH IMMUNE GLOBULIN CANDIDATE, PT RH POSITIVE Performed at [facility]    US Ob Comp Less 14  Wks  Result Date: 09/07/2015 CLINICAL DATA:  25 year old female with 1 day of vaginal bleeding in the first trimester of pregnancy. Initial encounter. Quantitative beta HCG 28,798. Estimated gestational age by ultrasound in July 10 weeks 0 days. EXAM: OBSTETRIC <14 WK [place] AND TRANSVAGINAL OB US TECHNIQUE: Both transabdominal and transvaginal ultrasound examinations were performed for complete evaluation of the gestation as well as the maternal uterus, adnexal regions, and pelvic cul-de-sac. Transvaginal technique was performed to assess early pregnancy. COMPARISON:  08/18/2015 FINDINGS: Intrauterine gestational sac: Single Yolk sac:  Visible Embryo:  Visible Cardiac Activity: Detected Heart Rate: 165  bpm CRL:  30.6  mm   9 w   6 d                  [place] EDC: 04/05/2016 Subchorionic hemorrhage:  None visualized. Maternal uterus/adnexae: Both ovaries appear normal. The left ovary is only visualized transabdominally. There is trace fluid in the cervix. No pelvic free fluid. IMPRESSION: Single living IUP demonstrated. No acute maternal findings visualized. Electronically Signed   By: Odessa Fleming M.D.   On: 09/07/2015 18:16   US Ob Comp Less 14 Wks  Result Date: 08/18/2015 CLINICAL DATA:  History of multiple positive home pregnancy test; right lower quadrant pain for the past 2 days, un known dates EXAM: OBSTETRIC <14 WK [place] AND TRANSVAGINAL OB US TECHNIQUE: Both transabdominal and transvaginal ultrasound examinations were performed for complete evaluation of the gestation as well as the maternal uterus, adnexal regions, and pelvic cul-de-sac. Transvaginal technique was performed to assess early pregnancy. COMPARISON:  None in PACs FINDINGS: Intrauterine gestational sac: Present, single Yolk sac:  Present Embryo:  Present Cardiac Activity: Present Heart Rate: 143  bpm CRL: 10.2 mm 7 w 1 d [place] EDC: April 04, 2016 Subchorionic hemorrhage:  None visualized. Maternal uterus/adnexae: The maternal ovaries were not visualized.  IMPRESSION: Single viable IUP with estimated gestational age of [redacted] weeks 1 day. Estimated date of delivery is April 04, 2016. No subchorionic hemorrhage is observed. Electronically Signed   By: David  [place] M.D.   On: 08/18/2015 12:03   US Ob Transvaginal  Result Date: 09/07/2015 CLINICAL DATA:  25 year old female with 1 day of vaginal bleeding in the first trimester of pregnancy. Initial encounter.  Quantitative beta HCG 28,798. Estimated gestational age by ultrasound in July 10 weeks 0 days. EXAM: OBSTETRIC <14 WK [place] AND TRANSVAGINAL OB US TECHNIQUE: Both transabdominal and transvaginal ultrasound examinations were performed for complete evaluation of the gestation as well as the maternal uterus, adnexal regions, and pelvic cul-de-sac. Transvaginal technique was performed to assess early pregnancy. COMPARISON:  08/18/2015 FINDINGS: Intrauterine gestational sac: Single Yolk sac:  Visible Embryo:  Visible Cardiac Activity: Detected Heart Rate: 165  bpm CRL:  30.6  mm   9 w   6 d                  [place] EDC: 04/05/2016 Subchorionic hemorrhage:  None visualized. Maternal uterus/adnexae: Both ovaries appear normal. The left ovary is only visualized transabdominally. There is trace fluid in the cervix. No pelvic free fluid. IMPRESSION: Single living IUP demonstrated. No acute maternal findings visualized. Electronically Signed   By: Odessa Fleming M.D.   On: 09/07/2015 18:16   US Ob Transvaginal  Result Date: 08/18/2015 CLINICAL DATA:  History of multiple positive home pregnancy test; right lower quadrant pain for the past 2 days, un known dates EXAM: OBSTETRIC <14 WK [place] AND TRANSVAGINAL OB US TECHNIQUE: Both transabdominal and transvaginal ultrasound examinations were performed for complete evaluation of the gestation as well as the maternal uterus, adnexal regions, and pelvic cul-de-sac. Transvaginal technique was performed to assess early pregnancy. COMPARISON:  None in PACs FINDINGS: Intrauterine gestational sac:  Present, single Yolk sac:  Present Embryo:  Present Cardiac Activity: Present Heart Rate: 143  bpm CRL: 10.2 mm 7 w 1 d [place] EDC: April 04, 2016 Subchorionic hemorrhage:  None visualized. Maternal uterus/adnexae: The maternal ovaries were not visualized. IMPRESSION: Single viable IUP with estimated gestational age of [redacted] weeks 1 day. Estimated date of delivery is April 04, 2016. No subchorionic hemorrhage is observed. Electronically Signed   By: David  [place] M.D.   On: 08/18/2015 12:03     Final Clinical Impressions(s) / ED Diagnoses   Final diagnoses:  Vaginal bleeding  Vaginal bleeding in pregnancy, unspecified trimester    New Prescriptions New Prescriptions   No medications on file  Patient states she had no plans of following up as she is moving to [place] later this year.  EDP explained that it is imperative that she establish care with an OB and it is unlikely at this point that an ED will find the source of this bleeding. She is instructed to strictly adhere to pelvic rest and we had a long, detail discussion what this means and that she needs to follow up for her first prenatal visit and pap smear.  AVS provided also details pelvic rest.  [facility] was provided.  All questions answered to patient's satisfaction. Based on history and exam patient has been appropriately medically screened and emergency conditions excluded. Patient is stable for discharge at this time. Follow up with your PMDfor recheck in 2 daysand strict return precautions given.   Cy Blamer, MD 09/08/15 [phone]

## 2015-09-08 NOTE — ED Notes (Signed)
Pelvic rest and importance of establishing an OB/GYN follow up was discussed with pt. Pt verbalized understanding and was agreeable to follow up.

## 2015-09-09 ENCOUNTER — Inpatient Hospital Stay (HOSPITAL_COMMUNITY)
Admission: AD | Admit: 2015-09-09 | Discharge: 2015-09-09 | Disposition: A | Discharge status: Home / Self Care | Payer: Medicaid Other | Source: Ambulatory Visit | Attending: Obstetrics & Gynecology | Admitting: Obstetrics & Gynecology

## 2015-09-09 ENCOUNTER — Encounter (HOSPITAL_COMMUNITY): Payer: Self-pay | Admitting: *Deleted

## 2015-09-09 DIAGNOSIS — O26891 Other specified pregnancy related conditions, first trimester: Secondary | ICD-10-CM | POA: Insufficient documentation

## 2015-09-09 DIAGNOSIS — R109 Unspecified abdominal pain: Secondary | ICD-10-CM | POA: Diagnosis not present

## 2015-09-09 DIAGNOSIS — Z3A10 10 weeks gestation of pregnancy: Secondary | ICD-10-CM | POA: Diagnosis not present

## 2015-09-09 DIAGNOSIS — O9989 Other specified diseases and conditions complicating pregnancy, childbirth and the puerperium: Secondary | ICD-10-CM

## 2015-09-09 DIAGNOSIS — O4691 Antepartum hemorrhage, unspecified, first trimester: Secondary | ICD-10-CM | POA: Diagnosis not present

## 2015-09-09 DIAGNOSIS — O209 Hemorrhage in early pregnancy, unspecified: Secondary | ICD-10-CM | POA: Diagnosis not present

## 2015-09-09 DIAGNOSIS — O26899 Other specified pregnancy related conditions, unspecified trimester: Secondary | ICD-10-CM

## 2015-09-09 LAB — URINALYSIS, ROUTINE W REFLEX MICROSCOPIC
BILIRUBIN URINE: NEGATIVE
GLUCOSE, UA: NEGATIVE mg/dL
KETONES UR: 15 mg/dL — AB
Leukocytes, UA: NEGATIVE
NITRITE: NEGATIVE
PH: 6 (ref 5.0–8.0)
PROTEIN: NEGATIVE mg/dL
Specific Gravity, Urine: 1.02 (ref 1.005–1.030)

## 2015-09-09 LAB — URINE MICROSCOPIC-ADD ON: RBC / HPF: NONE SEEN RBC/hpf (ref 0–5)

## 2015-09-09 NOTE — MAU Note (Signed)
Pt presents to MAU with complaints of vaginal bleeding on and off for a week. Lower abdominal cramping

## 2015-09-09 NOTE — MAU Provider Note (Signed)
S:  Tamara Garza is a 25 y.o.female G1P0000 @ [redacted]w[redacted]d here for a follow up. She was seen in the [facility] ED on 7/13 and recently on 8/2 with abdominal pain and vaginal bleeding. Her symptoms today have remained the same. She was told to follow up today because they were unable to find a source for her bleeding. The patient reports she had a full workup up both times and both times US showed a healthy pregnancy. She wanted to be evaluated by OBGYN.   Her pain is located in her lower abdomen; worse in the center and near the right lower quadrant. She denies N/V or fever. Her bleeding is described as very light and unchanged from 8/2. She has an appointment scheduled in the [facility] this month.   Beta hcg levels: 7/13: 28, 798 8/2: 96, 763   O:  GENERAL: Well-developed, well-nourished, anxious appearing female in no acute distress.  LUNGS: Effort normal SKIN: Warm, dry and without erythema ABDOMEN: with tenderness in suprapubic, umbilicus, RLQ; no guarding, no rebound  PSYCH: Normal mood and affect  Vitals:   09/09/15 1428  BP: 113/81  Pulse: 90  Resp: 18  Temp: 98.2 F (36.8 C)   MDM:  O positive blood type  Reviewed both US's reports with patient and significant other. Patient appreciative and voices understanding.    A:  1. Vaginal bleeding in pregnancy, first trimester   2. Abdominal pain in pregnancy     P:  Discharge home in stable condition Unknown cause of vaginal bleeding Discussed return precautions.  Pelvic rest  Bleeding precautions Keep appointment with [facility]    Duane Lope, NP 09/09/2015 3:44 PM

## 2015-09-22 ENCOUNTER — Ambulatory Visit (INDEPENDENT_AMBULATORY_CARE_PROVIDER_SITE_OTHER): Payer: Medicaid Other | Admitting: Family

## 2015-09-22 ENCOUNTER — Other Ambulatory Visit (HOSPITAL_COMMUNITY)
Admission: RE | Admit: 2015-09-22 | Discharge: 2015-09-22 | Disposition: A | Discharge status: Home / Self Care | Payer: Medicaid Other | Source: Ambulatory Visit | Attending: Family | Admitting: Family

## 2015-09-22 ENCOUNTER — Encounter: Payer: Self-pay | Admitting: Family

## 2015-09-22 VITALS — BP 114/73 | HR 89 | Wt 249.2 lb

## 2015-09-22 DIAGNOSIS — Z3490 Encounter for supervision of normal pregnancy, unspecified, unspecified trimester: Secondary | ICD-10-CM | POA: Insufficient documentation

## 2015-09-22 DIAGNOSIS — Z3491 Encounter for supervision of normal pregnancy, unspecified, first trimester: Secondary | ICD-10-CM | POA: Diagnosis not present

## 2015-09-22 DIAGNOSIS — Z01411 Encounter for gynecological examination (general) (routine) with abnormal findings: Secondary | ICD-10-CM | POA: Diagnosis not present

## 2015-09-22 DIAGNOSIS — Z113 Encounter for screening for infections with a predominantly sexual mode of transmission: Secondary | ICD-10-CM | POA: Insufficient documentation

## 2015-09-22 DIAGNOSIS — Z3481 Encounter for supervision of other normal pregnancy, first trimester: Secondary | ICD-10-CM

## 2015-09-22 NOTE — Progress Notes (Signed)
1 hour gtt due at 11:06am  Last pap 02/25/2014

## 2015-09-22 NOTE — Progress Notes (Signed)
Subjective:    Tamara Garza is a G1P0000 5837w1d being seen today for her first obstetrical visit.  Her obstetrical history is significant for obesity and vaginal bleeding in early pregnancy.  Reports spotting daily.   Patient does intend to breast feed. Pregnancy history fully reviewed.  Here with FOB [place].    Patient reports nausea and spotting of blood daily.  Nausea improving.  Vitals:   09/22/15 1000  BP: 114/73  Pulse: 89  Weight: 249 lb 3.2 oz (113 kg)    HISTORY: OB History  Gravida Para Term Preterm AB Living  1 0 0 0 0 0  SAB TAB Ectopic Multiple Live Births  0 0 0 0      # Outcome Date GA Lbr Len/2nd Weight Sex Delivery Anes PTL Lv  1 Current              Past Medical History:  Diagnosis Date  . ACNE 04/04/2006   Qualifier: Diagnosis of  By: Abundio MiuMcGregor, Barbara    . Asthma   . ASTHMA, INTERMITTENT 04/04/2006   Qualifier: Diagnosis of  By: Lelon PerlaSaunders MD, Vickki MuffWeston    . Gallstones 2011  . 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 LGSIL (low grade squamous intraepithelial dysplasia)   . Migraine 11/21/2011  . RHINITIS, ALLERGIC 04/04/2006   Qualifier: Diagnosis of  By: Abundio MiuMcGregor, Barbara     Past Surgical History:  Procedure Laterality Date  . CHOLECYSTECTOMY    . COLPOSCOPY  2009   Family History  Problem Relation Age of Onset  . Multiple sclerosis Mother   . Hypertension Mother   . Diabetes Father   . Breast cancer Maternal Grandmother      Exam   BP 114/73   Pulse 89   Wt 249 lb 3.2 oz (113 kg)   LMP 06/29/2015   BMI 41.47 kg/m  Uterine Size: size equals dates  Pelvic Exam:    Perineum: No Hemorrhoids, Normal Perineum   Vulva: normal   Vagina:  normal mucosa, normal discharge, no palpable nodules   pH: Not done   Cervix: No cervical motion tenderness and no lesions; small amount of dark red blood present   Adnexa: normal adnexa and no mass, fullness, tenderness   Bony Pelvis: Adequate  System: Breast:  No nipple retraction or dimpling, No nipple  discharge or bleeding, No axillary or supraclavicular adenopathy, Normal to palpation without dominant masses   Skin: normal coloration and turgor, no rashes    Neurologic: negative   Extremities: normal strength, tone, and muscle mass   HEENT neck supple with midline trachea and thyroid without masses   Mouth/Teeth mucous membranes moist, pharynx normal without lesions   Neck supple and no masses   Cardiovascular: regular rate and rhythm, no murmurs or gallops   Respiratory:  appears well, vitals normal, no respiratory distress, acyanotic, normal RR, neck free of mass or lymphadenopathy, chest clear, no wheezing, crepitations, rhonchi, normal symmetric air entry   Abdomen: soft, non-tender; bowel sounds normal; no masses,  no organomegaly   Urinary: urethral meatus normal      Assessment:    Pregnancy: G1P0000 Patient Active Problem List   Diagnosis Date Noted  . Supervision of normal pregnancy, antepartum 09/22/2015  . Migraine 11/21/2011  . Back pain 11/21/2011  . 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 Left breast mass 11/21/2011  . Preventative health care 11/10/2011  . LGSIL (low grade squamous intraepithelial dysplasia)   . 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  TOBACCO USER 10/23/2008  . OBESITY, NOS 04/04/2006  . HEARING LOSS NOS OR DEAFNESS 04/04/2006  . RHINITIS, ALLERGIC 04/04/2006  . ASTHMA, INTERMITTENT 04/04/2006  . ACNE 04/04/2006        Plan:     Initial labs drawn.  Pap smear collected. Prenatal vitamins. Problem list reviewed and updated. Genetic Screening discussed First Screen: ordered. Follow up in 4 weeks. 75% of 20 min visit spent on counseling and coordination of care.    Marlis Edelson 09/22/2015

## 2015-09-22 NOTE — Patient Instructions (Signed)

## 2015-09-23 LAB — GC/CHLAMYDIA PROBE AMP (~~LOC~~) NOT AT ARMC
CHLAMYDIA, DNA PROBE: NEGATIVE
Neisseria Gonorrhea: NEGATIVE

## 2015-09-23 LAB — GLUCOSE TOLERANCE, 1 HOUR (50G) W/O FASTING: Glucose, 1 Hr, gestational: 88 mg/dL (ref <140)

## 2015-09-24 LAB — PRENATAL PROFILE (SOLSTAS)
Antibody Screen: NEGATIVE
BASOS PCT: 0 %
Basophils Absolute: 0 cells/uL (ref 0–200)
Eosinophils Absolute: 150 cells/uL (ref 15–500)
Eosinophils Relative: 3 %
HEMATOCRIT: 35.5 % (ref 35.0–45.0)
HEP B S AG: NEGATIVE
HIV: NONREACTIVE
Hemoglobin: 11.4 g/dL — ABNORMAL LOW (ref 11.7–15.5)
LYMPHS ABS: 1450 {cells}/uL (ref 850–3900)
Lymphocytes Relative: 29 %
MCH: 28.5 pg (ref 27.0–33.0)
MCHC: 32.1 g/dL (ref 32.0–36.0)
MCV: 88.8 fL (ref 80.0–100.0)
MONO ABS: 500 {cells}/uL (ref 200–950)
MPV: 10.1 fL (ref 7.5–12.5)
Monocytes Relative: 10 %
NEUTROS PCT: 58 %
Neutro Abs: 2900 cells/uL (ref 1500–7800)
Platelets: 239 10*3/uL (ref 140–400)
RBC: 4 MIL/uL (ref 3.80–5.10)
RDW: 15 % (ref 11.0–15.0)
Rh Type: POSITIVE
Rubella: 2.68 Index — ABNORMAL HIGH (ref <0.90)
WBC: 5 10*3/uL (ref 3.8–10.8)

## 2015-09-24 LAB — CULTURE, OB URINE
COLONY COUNT: NO GROWTH
Organism ID, Bacteria: NO GROWTH

## 2015-09-26 LAB — PAIN MGMT, PROFILE 6 CONF W/O MM, U
6 ACETYLMORPHINE: NEGATIVE ng/mL (ref <10)
ALCOHOL METABOLITES: NEGATIVE ng/mL (ref <500)
Amphetamines: NEGATIVE ng/mL (ref <500)
Barbiturates: NEGATIVE ng/mL (ref <300)
Benzodiazepines: NEGATIVE ng/mL (ref <100)
Cocaine Metabolite: NEGATIVE ng/mL (ref <150)
Creatinine: 165.3 mg/dL (ref >=20.0)
MARIJUANA METABOLITE: 20 ng/mL — AB (ref <5)
MARIJUANA METABOLITE: POSITIVE ng/mL — AB (ref <20)
Methadone Metabolite: NEGATIVE ng/mL (ref <100)
Opiates: NEGATIVE ng/mL (ref <100)
Oxidant: NEGATIVE ug/mL (ref <200)
Oxycodone: NEGATIVE ng/mL (ref <100)
PLEASE NOTE: 0
Phencyclidine: NEGATIVE ng/mL (ref <25)
pH: 6.9 (ref 4.5–9.0)

## 2015-09-26 LAB — HEMOGLOBINOPATHY EVALUATION
HEMATOCRIT: 35.5 % (ref 35.0–45.0)
Hemoglobin: 11.4 g/dL — ABNORMAL LOW (ref 11.7–15.5)
Hgb A2 Quant: 2.4 % (ref 1.8–3.5)
Hgb A: 96.6 % (ref >96.0)
Hgb F Quant: <1 % (ref <2.0)
MCH: 28.5 pg (ref 27.0–33.0)
MCV: 88.8 fL (ref 80.0–100.0)
RBC: 4 MIL/uL (ref 3.80–5.10)
RDW: 15 % (ref 11.0–15.0)

## 2015-09-26 LAB — CYTOLOGY - PAP

## 2015-09-27 ENCOUNTER — Encounter (HOSPITAL_COMMUNITY): Payer: Self-pay | Admitting: Family

## 2015-10-04 ENCOUNTER — Ambulatory Visit (HOSPITAL_COMMUNITY)
Admission: RE | Admit: 2015-10-04 | Discharge: 2015-10-04 | Disposition: A | Discharge status: Home / Self Care | Payer: Medicaid Other | Source: Ambulatory Visit | Attending: Family | Admitting: Family

## 2015-10-04 ENCOUNTER — Encounter (HOSPITAL_COMMUNITY): Payer: Self-pay

## 2015-10-04 DIAGNOSIS — O99211 Obesity complicating pregnancy, first trimester: Secondary | ICD-10-CM | POA: Insufficient documentation

## 2015-10-04 DIAGNOSIS — Z36 Encounter for antenatal screening of mother: Secondary | ICD-10-CM | POA: Diagnosis not present

## 2015-10-04 DIAGNOSIS — Z3491 Encounter for supervision of normal pregnancy, unspecified, first trimester: Secondary | ICD-10-CM

## 2015-10-04 DIAGNOSIS — Z3A13 13 weeks gestation of pregnancy: Secondary | ICD-10-CM | POA: Diagnosis not present

## 2015-10-04 DIAGNOSIS — Z3481 Encounter for supervision of other normal pregnancy, first trimester: Secondary | ICD-10-CM

## 2015-10-20 ENCOUNTER — Ambulatory Visit (INDEPENDENT_AMBULATORY_CARE_PROVIDER_SITE_OTHER): Payer: Medicaid Other | Admitting: Family Medicine

## 2015-10-20 VITALS — BP 116/77 | HR 91 | Wt 248.7 lb

## 2015-10-20 DIAGNOSIS — Z3482 Encounter for supervision of other normal pregnancy, second trimester: Secondary | ICD-10-CM

## 2015-10-20 LAB — POCT URINALYSIS DIP (DEVICE)
BILIRUBIN URINE: NEGATIVE
Glucose, UA: NEGATIVE mg/dL
HGB URINE DIPSTICK: NEGATIVE
Ketones, ur: 15 mg/dL — AB
LEUKOCYTES UA: NEGATIVE
NITRITE: NEGATIVE
PH: 6.5 (ref 5.0–8.0)
Protein, ur: NEGATIVE mg/dL
Specific Gravity, Urine: 1.025 (ref 1.005–1.030)
UROBILINOGEN UA: 0.2 mg/dL (ref 0.0–1.0)

## 2015-10-20 NOTE — Progress Notes (Addendum)
   PRENATAL VISIT NOTE  Subjective:  Tamara Garza is a 25 y.o. G1P0000 at [redacted]w[redacted]d being seen today for ongoing prenatal care.  She is currently monitored for the following issues for this low-risk pregnancy and has OBESITY, NOS; TOBACCO USER; HEARING LOSS NOS OR DEAFNESS; RHINITIS, ALLERGIC; ASTHMA, INTERMITTENT; ACNE; Preventative health care; LGSIL (low grade squamous intraepithelial dysplasia); High risk HPV infection; Migraine; Back pain; Carpal tunnel syndrome of left wrist; Left breast mass; and Supervision of normal pregnancy, antepartum on her problem list.  Patient reports mild pelvic pain.  Contractions: Not present. Vag. Bleeding: None.  Movement: Absent. Denies leaking of fluid.   Depression screen: PHQ-9 18.  GAD-7: 15.  Patient reports that her mood is episodic.  No SI./Hi.  Declines visit with Marijean Niemann.  The following portions of the patient's history were reviewed and updated as appropriate: allergies, current medications, past family history, past medical history, past social history, past surgical history and problem list. Problem list updated.  Objective:   Vitals:   10/20/15 1549  BP: 116/77  Pulse: 91  Weight: 248 lb 11.2 oz (112.8 kg)    Fetal Status: Fetal Heart Rate (bpm): 152   Movement: Absent     General:  Alert, oriented and cooperative. Patient is in no acute distress.  Skin: Skin is warm and dry. No rash noted.   Cardiovascular: Normal heart rate noted  Respiratory: Normal respiratory effort, no problems with respiration noted  Abdomen: Soft, gravid, appropriate for gestational age. Pain/Pressure: Absent     Pelvic:  Cervical exam deferred        Extremities: Normal range of motion.  Edema: None  Mental Status: Normal mood and affect. Normal behavior. Normal judgment and thought content.   Urinalysis:      Assessment and Plan:  Pregnancy: G1P0000 at [redacted]w[redacted]d  1. Supervision of normal pregnancy, antepartum, second trimester Has round ligament tenderness.   FHT and FH normal.    Preterm labor symptoms and general obstetric precautions including but not limited to vaginal bleeding, contractions, leaking of fluid and fetal movement were reviewed in detail with the patient. Please refer to After Visit Summary for other counseling recommendations.  Return in about 4 weeks (around 11/17/2015).  Levie Heritage, DO

## 2015-10-31 ENCOUNTER — Ambulatory Visit (HOSPITAL_COMMUNITY)
Admission: RE | Admit: 2015-10-31 | Discharge: 2015-10-31 | Disposition: A | Discharge status: Home / Self Care | Payer: Medicaid Other | Source: Ambulatory Visit | Attending: Family | Admitting: Family

## 2015-10-31 ENCOUNTER — Encounter (HOSPITAL_COMMUNITY): Payer: Self-pay

## 2015-10-31 DIAGNOSIS — O99332 Smoking (tobacco) complicating pregnancy, second trimester: Secondary | ICD-10-CM | POA: Diagnosis not present

## 2015-10-31 DIAGNOSIS — Z3491 Encounter for supervision of normal pregnancy, unspecified, first trimester: Secondary | ICD-10-CM

## 2015-10-31 DIAGNOSIS — Z3A17 17 weeks gestation of pregnancy: Secondary | ICD-10-CM | POA: Insufficient documentation

## 2015-10-31 DIAGNOSIS — O99211 Obesity complicating pregnancy, first trimester: Secondary | ICD-10-CM | POA: Insufficient documentation

## 2015-11-01 ENCOUNTER — Other Ambulatory Visit (HOSPITAL_COMMUNITY): Payer: Self-pay | Admitting: Maternal and Fetal Medicine

## 2015-11-01 DIAGNOSIS — O99212 Obesity complicating pregnancy, second trimester: Secondary | ICD-10-CM

## 2015-11-01 DIAGNOSIS — O99332 Smoking (tobacco) complicating pregnancy, second trimester: Secondary | ICD-10-CM

## 2015-11-01 DIAGNOSIS — Z0489 Encounter for examination and observation for other specified reasons: Secondary | ICD-10-CM

## 2015-11-01 DIAGNOSIS — Z3A21 21 weeks gestation of pregnancy: Secondary | ICD-10-CM

## 2015-11-01 DIAGNOSIS — IMO0002 Reserved for concepts with insufficient information to code with codable children: Secondary | ICD-10-CM

## 2015-11-23 ENCOUNTER — Ambulatory Visit (INDEPENDENT_AMBULATORY_CARE_PROVIDER_SITE_OTHER): Payer: Medicaid Other | Admitting: Clinical

## 2015-11-23 ENCOUNTER — Ambulatory Visit (INDEPENDENT_AMBULATORY_CARE_PROVIDER_SITE_OTHER): Payer: Medicaid Other | Admitting: Family Medicine

## 2015-11-23 VITALS — BP 121/62 | HR 87 | Wt 254.2 lb

## 2015-11-23 DIAGNOSIS — Z3400 Encounter for supervision of normal first pregnancy, unspecified trimester: Secondary | ICD-10-CM

## 2015-11-23 DIAGNOSIS — Z3402 Encounter for supervision of normal first pregnancy, second trimester: Secondary | ICD-10-CM

## 2015-11-23 DIAGNOSIS — F4323 Adjustment disorder with mixed anxiety and depressed mood: Secondary | ICD-10-CM

## 2015-11-23 NOTE — BH Specialist Note (Signed)
Session Start time: 1:25   End Time: 1:55 Total Time:  30 minutes Type of Service: Behavioral Health - Individual/Family Interpreter: No.   Interpreter Name & Language: n/a # [facility] Visits July 2017-June 2018: 1st   SUBJECTIVE: Tamara Garza is a 25 y.o. female  Pt. was referred by Candelaria Celeste, DO for:  anxiety and depression. Pt. reports the following symptoms/concerns: Pt states that she feels nervous because this is her first baby, and attributes feelings of depression and anxiousness to worry over managing working and pregnancy/birth; used to enjoy yoga and meditation, but has not done since prior to pregnancy. 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  Pt may benefit from psychoeducation and brief therapeutic interventions regarding coping with symptoms of anxiety and depression.      PLAN: 1. F/U with behavioral health clinician: One month, or as needed 2. Behavioral Health meds: none 3. Behavioral recommendations:  -Consider practicing yoga again -Daily CALM relaxation breathing technique -Consider having uplifting song(s), favorite comedy shows, and list of most supportive friends/family available to call/text, on greatest low-energy days -Consider reading and filling out Postpartum Plan, to bring back and  discuss at next visit -Read educational material regarding coping with symptoms of anxiety and depression 4. Referral: Brief Counseling/Psychotherapy and Psychoeducation  Rae Lips LCSWA Behavioral Health Clinician  Marlon Pel:   Warm Hand Off Completed.      Depression screen [facility] 2/9 11/23/2015 09/22/2015  Decreased Interest 1 2  Down, Depressed, Hopeless 1 2  PHQ - 2 Score 2 4  Altered sleeping 3 3  Tired, decreased energy 3 3  Change in appetite 0 3  Feeling bad or failure about yourself  3 1  Trouble concentrating 1 0  Moving slowly or fidgety/restless 2 1  Suicidal thoughts 0 0  PHQ-9 Score 14 15   GAD 7 : Generalized Anxiety Score 11/23/2015 09/22/2015  Nervous, Anxious, on Edge 3 2  Control/stop worrying 2 1  Worry too much - different things 3 1  Trouble relaxing 3 3  Restless 1 3  Easily annoyed or irritable 2 3  Afraid - awful might happen 2 2  Total GAD 7 Score 16 15

## 2015-11-23 NOTE — Progress Notes (Signed)
   PRENATAL VISIT NOTE  Subjective:  Tamara Garza is a 25 y.o. G1P0000 at 5780w0d being seen today for ongoing prenatal care.  She is currently monitored for the following issues for this low-risk pregnancy and has Obesity; TOBACCO USER; HEARING LOSS NOS OR DEAFNESS; RHINITIS, ALLERGIC; ASTHMA, INTERMITTENT; ACNE; Preventative health care; LGSIL (low grade squamous intraepithelial dysplasia); High risk HPV infection; Migraine; Back pain; Carpal tunnel syndrome of left wrist; Left breast mass; and Supervision of normal pregnancy, antepartum on her problem list.  Patient reports no complaints.  Contractions: Not present. Vag. Bleeding: None.   . Denies leaking of fluid.   The following portions of the patient's history were reviewed and updated as appropriate: allergies, current medications, past family history, past medical history, past social history, past surgical history and problem list. Problem list updated.  Objective:   Vitals:   11/23/15 1303  BP: 121/62  Pulse: 87  Weight: 254 lb 3.2 oz (115.3 kg)    Fetal Status: Fetal Heart Rate (bpm): 154 Fundal Height: 20 cm       General:  Alert, oriented and cooperative. Patient is in no acute distress.  Skin: Skin is warm and dry. No rash noted.   Cardiovascular: Normal heart rate noted  Respiratory: Normal respiratory effort, no problems with respiration noted  Abdomen: Soft, gravid, appropriate for gestational age. Pain/Pressure: Present     Pelvic:  Cervical exam deferred        Extremities: Normal range of motion.  Edema: None  Mental Status: Normal mood and affect. Normal behavior. Normal judgment and thought content.   Assessment and Plan:  Pregnancy: G1P0000 at 780w0d  1. Supervision of normal first pregnancy, antepartum FHT and FH normal. Discussed circumcision for baby. Unsure at this point. F/u US next week.  Preterm labor symptoms and general obstetric precautions including but not limited to vaginal bleeding,  contractions, leaking of fluid and fetal movement were reviewed in detail with the patient. Please refer to After Visit Summary for other counseling recommendations.  Return in about 4 weeks (around 12/21/2015) for OB f/u.  Levie HeritageJacob J Gizelle Whetsel, DO

## 2015-11-28 ENCOUNTER — Ambulatory Visit (HOSPITAL_COMMUNITY)
Admission: RE | Admit: 2015-11-28 | Discharge: 2015-11-28 | Disposition: A | Discharge status: Home / Self Care | Payer: Medicaid Other | Source: Ambulatory Visit | Attending: Maternal and Fetal Medicine | Admitting: Maternal and Fetal Medicine

## 2015-11-28 ENCOUNTER — Encounter (HOSPITAL_COMMUNITY): Payer: Self-pay

## 2015-11-28 DIAGNOSIS — O99211 Obesity complicating pregnancy, first trimester: Secondary | ICD-10-CM | POA: Diagnosis present

## 2015-11-28 DIAGNOSIS — Z3A21 21 weeks gestation of pregnancy: Secondary | ICD-10-CM | POA: Diagnosis not present

## 2015-11-28 DIAGNOSIS — O99332 Smoking (tobacco) complicating pregnancy, second trimester: Secondary | ICD-10-CM

## 2015-11-28 DIAGNOSIS — Z0489 Encounter for examination and observation for other specified reasons: Secondary | ICD-10-CM

## 2015-11-28 DIAGNOSIS — IMO0002 Reserved for concepts with insufficient information to code with codable children: Secondary | ICD-10-CM

## 2015-11-28 DIAGNOSIS — O99212 Obesity complicating pregnancy, second trimester: Secondary | ICD-10-CM

## 2015-11-29 ENCOUNTER — Ambulatory Visit (HOSPITAL_COMMUNITY): Payer: Medicaid Other

## 2015-12-14 ENCOUNTER — Ambulatory Visit (INDEPENDENT_AMBULATORY_CARE_PROVIDER_SITE_OTHER): Payer: Medicaid Other | Admitting: Obstetrics and Gynecology

## 2015-12-14 VITALS — BP 117/78 | HR 96 | Wt 256.0 lb

## 2015-12-14 DIAGNOSIS — K5901 Slow transit constipation: Secondary | ICD-10-CM

## 2015-12-14 DIAGNOSIS — Z3402 Encounter for supervision of normal first pregnancy, second trimester: Secondary | ICD-10-CM

## 2015-12-14 DIAGNOSIS — Z3400 Encounter for supervision of normal first pregnancy, unspecified trimester: Secondary | ICD-10-CM

## 2015-12-14 MED ORDER — DOCUSATE SODIUM 100 MG PO CAPS: 100.0000 mg | ORAL_CAPSULE | Freq: Two times a day (BID) | ORAL | 0 refills | Status: DC

## 2015-12-14 MED ORDER — POLYETHYLENE GLYCOL 3350 17 GM/SCOOP PO POWD: 1.0000 | Freq: Once | ORAL | 0 refills | Status: AC

## 2015-12-14 NOTE — Progress Notes (Signed)
   PRENATAL VISIT NOTE  Subjective:  Tamara Garza is a 25 y.o. G1P0000 at [redacted]w[redacted]d being seen today for ongoing prenatal care.  She is currently monitored for the following issues for this low-risk pregnancy and has Obesity; TOBACCO USER; HEARING LOSS NOS OR DEAFNESS; RHINITIS, ALLERGIC; ASTHMA, INTERMITTENT; ACNE; Preventative health care; LGSIL (low grade squamous intraepithelial dysplasia); High risk HPV infection; Migraine; Back pain; Carpal tunnel syndrome of left wrist; Left breast mass; and Supervision of normal pregnancy, antepartum on her problem list.   Patient was seen in ER in [place] on 11/6 for period like bleeding and RLQ abdominal pain. She denied anything in the vagina prior to this bleeding. She does state that it lasted for 1 day and resolved. She reports regular fetal movement. She has also not had a BM in several days and when she does it is hard and difficult to pass.   Patient reports no bleeding.  Contractions: Not present. Vag. Bleeding: None.  Movement: Present. Denies leaking of fluid.   The following portions of the patient's history were reviewed and updated as appropriate: allergies, current medications, past family history, past medical history, past social history, past surgical history and problem list. Problem list updated.  Objective:   Vitals:   12/14/15 0927  BP: 117/78  Pulse: 96  Weight: 256 lb (116.1 kg)    Fetal Status: Fetal Heart Rate (bpm): 143   Movement: Present     General:  Alert, oriented and cooperative. Patient is in no acute distress.  Skin: Skin is warm and dry. No rash noted.   Cardiovascular: Normal heart rate noted  Respiratory: Normal respiratory effort, no problems with respiration noted  Abdomen: Soft, gravid, appropriate for gestational age. Pain/Pressure: Present     Pelvic:  Cervical exam deferred        Extremities: Normal range of motion.  Edema: Trace  Mental Status: Normal mood and affect. Normal behavior. Normal judgment  and thought content.   Assessment and Plan:  Pregnancy: G1P0000 at [redacted]w[redacted]d  1. Supervision of normal first pregnancy, antepartum Routine care 28wks labs and TDap at next visit.  Plan to continue to monitor for on going bleeding. No bleeding and good heart rate at this time. No need for further work up. Rh +. [place] reviewed. No placenta previa  2. Constipation by delayed colonic transit Patient with constipation. Advised hold pre-natal x1 wk.  miralax one cap full daily for 1 wk then PRN. - polyethylene glycol powder (MIRALAX) powder; Take 255 g by mouth once.  Dispense: 255 g; Refill: 0 - docusate sodium (COLACE) 100 MG capsule; Take 1 capsule (100 mg total) by mouth 2 (two) times daily.  Dispense: 10 capsule; Refill: 0  Preterm labor symptoms and general obstetric precautions including but not limited to vaginal bleeding, contractions, leaking of fluid and fetal movement were reviewed in detail with the patient. Please refer to After Visit Summary for other counseling recommendations.  No Follow-up on file.   Lorne Skeens, MD

## 2015-12-14 NOTE — Patient Instructions (Signed)
AREA PEDIATRIC/FAMILY PRACTICE PHYSICIANS  [facility] [address], [address] [place], [place]  [address] Phone - [phone]   Fax - [phone]  [facility] [address] [place], [place] [address] Phone - [phone]   Fax - [phone]  [facility] [address] [place], [place]  [address] Phone - [phone]   Fax - [phone]  [facility] [address] [place], [place]  [address] Phone - [phone]   Fax - [phone]  [facility] [address] [place], [place]  [address] Phone - [phone]   Fax - [phone]  [facility] [address], [address] [place], [place]  [address] Phone - [phone]   Fax - [phone]  [facility] [address] [place], [place]  [address] Phone - [phone]   Fax - [phone]  [facility] [address], [address] [place], [place]  [address] Phone - [phone]   Fax - [phone]  [facility] [address] [place], [place]  [address] Phone - [phone]   Fax - [phone] [facility] [address] [place], [place]  [address] Phone - [phone]   Fax - [phone]  [facility] [address] [place], [place]  [address] Phone - [phone]   Fax - [phone]  [facility] [address] [place], [place]  [address] Phone - [phone]   Fax - [phone]  [facility] [address] [place], [place]  [address] Phone - [phone]   Fax - [phone]  [facility] [address] [place], [place]  [address] Phone - [phone]  [facility] [address] [place], [place]  [address] Phone - [phone]   Fax - [phone]  [facility] [address] [place], [place]  [address] Phone - [phone]   Fax - [phone]  [facility] [address] [place], [place]  [address] Phone - [phone]   Fax - [phone]  [facility] [address] [place], [place]  [address] Phone - [phone]   Fax - [phone]   [facility] [address] [place], [place]  [address] Phone - [phone]   Fax - [phone]  [facility] [address] [place], [place]  [address] Phone - [phone]   Fax - [phone]  [facility] [address] , [place]  [address] Phone - [phone]   Fax - [phone]  [facility] [address] , [place]  [address] Phone - [phone]   Fax - [phone]  [facility] [address] , [place]  [address] Phone - [phone]   Fax - [phone] [facility] [address] [place], [place]  [address] Phone - [phone]   Fax - [phone]  [facility] [address] [place], [place]  [address] Phone - [phone]   Fax - [phone]  [facility] [address] ,   [address] Phone - [phone]   Fax - [phone]   

## 2015-12-14 NOTE — Progress Notes (Signed)
Patient reports MAU visit on 11/6. Patient states pain has improve somewhat since then. Rates pain as a 7 constant in RLQ/groin

## 2016-01-11 ENCOUNTER — Encounter: Payer: Self-pay | Admitting: Student

## 2016-01-18 ENCOUNTER — Inpatient Hospital Stay (HOSPITAL_COMMUNITY): Payer: Medicaid Other

## 2016-01-18 ENCOUNTER — Inpatient Hospital Stay (HOSPITAL_COMMUNITY)
Admission: AD | Admit: 2016-01-18 | Discharge: 2016-01-18 | Disposition: A | Discharge status: Home / Self Care | Payer: Medicaid Other | Source: Ambulatory Visit | Attending: Family Medicine | Admitting: Family Medicine

## 2016-01-18 ENCOUNTER — Encounter (HOSPITAL_COMMUNITY): Payer: Self-pay

## 2016-01-18 DIAGNOSIS — T1490XA Injury, unspecified, initial encounter: Secondary | ICD-10-CM

## 2016-01-18 DIAGNOSIS — Z87891 Personal history of nicotine dependence: Secondary | ICD-10-CM | POA: Diagnosis not present

## 2016-01-18 DIAGNOSIS — Y9301 Activity, walking, marching and hiking: Secondary | ICD-10-CM | POA: Diagnosis not present

## 2016-01-18 DIAGNOSIS — Z3A29 29 weeks gestation of pregnancy: Secondary | ICD-10-CM | POA: Insufficient documentation

## 2016-01-18 DIAGNOSIS — Z882 Allergy status to sulfonamides status: Secondary | ICD-10-CM | POA: Insufficient documentation

## 2016-01-18 DIAGNOSIS — W109XXA Fall (on) (from) unspecified stairs and steps, initial encounter: Secondary | ICD-10-CM | POA: Diagnosis not present

## 2016-01-18 DIAGNOSIS — W108XXA Fall (on) (from) other stairs and steps, initial encounter: Secondary | ICD-10-CM | POA: Diagnosis not present

## 2016-01-18 DIAGNOSIS — O26893 Other specified pregnancy related conditions, third trimester: Secondary | ICD-10-CM | POA: Insufficient documentation

## 2016-01-18 DIAGNOSIS — O9A213 Injury, poisoning and certain other consequences of external causes complicating pregnancy, third trimester: Secondary | ICD-10-CM | POA: Diagnosis not present

## 2016-01-18 DIAGNOSIS — Z880 Allergy status to penicillin: Secondary | ICD-10-CM | POA: Insufficient documentation

## 2016-01-18 MED ORDER — ACETAMINOPHEN 500 MG PO TABS
1000.0000 mg | ORAL_TABLET | Freq: Once | ORAL | Status: AC
  Administered 2016-01-18: 1000 mg via ORAL
  Filled 2016-01-18: qty 2

## 2016-01-18 MED ORDER — CYCLOBENZAPRINE HCL 10 MG PO TABS: 5.0000 mg | ORAL_TABLET | Freq: Three times a day (TID) | ORAL | 0 refills | Status: DC | PRN

## 2016-01-18 NOTE — Discharge Instructions (Signed)
What Do I Need to Know About Injuries During Pregnancy? °Trauma is the most common cause of injury and death in pregnant women. This can also result in significant harm or death of the baby. °Your baby is protected in the womb (uterus) by a sac filled with fluid (amniotic sac). Your baby can be harmed if there is direct, high-impact trauma to your abdomen and pelvis. This type of trauma can result in tearing of your uterus, the placenta pulling away from the wall of the uterus (placenta abruption), or the amniotic sac breaking open (rupture of membranes). These injuries can decrease or stop the blood supply to your baby or cause you to go into labor earlier than expected. Minor falls and low-impact automobile accidents do not usually harm your baby, even if they do minimally harm you. °WHAT KIND OF INJURIES CAN AFFECT MY PREGNANCY? °The most common causes of injury or death to a baby include: °· Falls. Falls are more common in the second and third trimester of the pregnancy. Factors that increase your risk of falling include: °¨ Increase in your weight. °¨ The change in your center of gravity. °¨ Tripping over an object that cannot be seen. °¨ Increased looseness (laxity) of your ligaments resulting in less coordinated movements (you may feel clumsy). °¨ Falling during high-risk activities like horseback riding or skiing. °· Automobile accidents. It is important to wear your seat belt properly, with the lap belt below your abdomen, and always practice safe driving. °· Domestic violence or assault. °· Burns (fire or electrical). °The most common causes of injury or death to the pregnant woman include: °· Injuries that cause severe bleeding, shock, and loss of blood flow to major organs. °· Head and neck injuries that result in severe brain or spinal damage. °· Chest trauma that can cause direct injury to the heart and lungs or any injury that affects the area enclosed by the ribs. Trauma to this area can result in  cardiorespiratory arrest. °WHAT CAN I DO TO PROTECT MYSELF AND MY BABY FROM INJURY WHILE I AM PREGNANT? °· Remove slippery rugs and loose objects on the floor that increase your risk of tripping. °· Avoid walking on wet or slippery floors. °· Wear comfortable shoes that have a good grip on the sole. Do not wear high-heeled shoes. °· Always wear your seat belt properly, with the lap belt below your abdomen, and always practice safe driving. Do not ride on a motorcycle while pregnant. °· Do not participate in high-impact activities or sports. °· Avoid fires, starting fires, lifting heavy pots of boiling or hot liquids, and fixing electrical problems. °· Only take over-the-counter or prescription medicines for pain, fever, or discomfort as directed by your health care provider. °· Know your blood type and the father's blood type in case you develop vaginal bleeding or experience an injury for which a blood transfusion may be necessary. °· Call your local emergency services (911 in the [place] if you are a victim of domestic violence or assault. Spousal abuse can be a significant cause of trauma during pregnancy. For help and support, contact the National Domestic Violence Hotline. °WHEN SHOULD I SEEK IMMEDIATE MEDICAL CARE?  °· You fall on your abdomen or experience any high-force accident or injury. °· You have been assaulted (domestic or otherwise). °· You have been in a car accident. °· You develop vaginal bleeding. °· You develop fluid leaking from the vagina. °· You develop uterine contractions (pelvic cramping, pain, or significant low back   pain). °· You become weak or faint, or have uncontrolled vomiting after trauma. °· You had a serious burn. This includes burns to the face, neck, hands, or genitals, or burns greater than the size of your palm anywhere else. °· You develop neck stiffness or pain after a fall or from other trauma. °· You develop a headache or vision problems after a fall or from other  trauma. °· You do not feel the baby moving or the baby is not moving as much as before a fall or other trauma. °This information is not intended to replace advice given to you by your health care provider. Make sure you discuss any questions you have with your health care provider. °Document Released: 03/01/2004 Document Revised: 02/12/2014 Document Reviewed: 10/29/2012 °Elsevier Interactive Patient Education © 2017 [employer] Inc[employer] ° °

## 2016-01-18 NOTE — MAU Provider Note (Signed)
History     CSN: [id number]  Arrival date and time: 01/18/16 4098   None     Chief Complaint  Patient presents with  . Fall   Was walking up the stairs at 8am , tripped over her foot, and fell on R side. Landed on arm and R abdomen. Denies hitting head, no LOC.  Immediately felt aching pain at the site of impact. Put heating pad on it, but it didn't help. Got in her car to presented to MAU, and felt sharp pain 8/10 which has continued. No vaginal bleeding, LOF, contractions. Has not felt fetal movement since the fall.  Being on right side make pain worse. Nothing has improved it.  Only complication of pregnancy was some self-resolving vaginal bleeding last month. Was seen in MAU and followed up in clinic.     OB History    Gravida Para Term Preterm AB Living   1 0 0 0 0 0   SAB TAB Ectopic Multiple Live Births   0 0 0 0        Past Medical History:  Diagnosis Date  . ACNE 04/04/2006   Qualifier: Diagnosis of  By: Abundio Miu    . Asthma   . ASTHMA, INTERMITTENT 04/04/2006   Qualifier: Diagnosis of  By: Lelon Perla MD, Vickki Muff    . Gallstones 2011  . 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 LGSIL (low grade squamous intraepithelial dysplasia)   . Migraine 11/21/2011  . RHINITIS, ALLERGIC 04/04/2006   Qualifier: Diagnosis of  By: Abundio Miu      Past Surgical History:  Procedure Laterality Date  . CHOLECYSTECTOMY    . COLPOSCOPY  2009    Family History  Problem Relation Age of Onset  . Multiple sclerosis Mother   . Hypertension Mother   . Diabetes Father   . Breast cancer Maternal Grandmother     Social History  Substance Use Topics  . Smoking status: Former Smoker    Packs/day: 0.25    Types: Cigarettes  . Smokeless tobacco: Never Used  . Alcohol use 0.6 oz/week    1 Glasses of wine per week     Comment: occ.    Allergies:  Allergies  Allergen Reactions  . Eggs Or Egg-Derived Products Swelling  . Penicillins Rash    Has patient had a PCN reaction  causing immediate rash, facial/tongue/throat swelling, SOB or lightheadedness with hypotension: unknown Has patient had a PCN reaction causing severe rash involving mucus membranes or skin necrosis: unknown Has patient had a PCN reaction that required hospitalization unknown Has patient had a PCN reaction occurring within the last 10 years: no If all of the above answers are "NO", then may proceed with Cephalosporin use.   . Sulfonamide Derivatives Rash    Prescriptions Prior to Admission  Medication Sig Dispense Refill Last Dose  . Prenatal Vit w/Fe-Methylfol-FA (PNV PO) Take by mouth.   Past Week at Unknown time  . albuterol (PROVENTIL HFA;VENTOLIN HFA) 108 (90 BASE) MCG/ACT inhaler Inhale 1 puff into the lungs every 6 (six) hours as needed for wheezing or shortness of breath.   rescue  . docusate sodium (COLACE) 100 MG capsule Take 1 capsule (100 mg total) by mouth 2 (two) times daily. (Patient not taking: Reported on 01/18/2016) 10 capsule 0 Not Taking at Unknown time    Review of Systems  Gastrointestinal: Positive for abdominal pain and nausea. Negative for diarrhea and vomiting.  Genitourinary: Negative for dysuria.   Physical Exam  Blood pressure 138/87, temperature 97.8 F (36.6 C), temperature source Oral, resp. rate 18, last menstrual period 06/29/2015.  Physical Exam  Constitutional: She is oriented to person, place, and time. She appears well-developed and well-nourished. No distress.  HENT:  Head: Normocephalic and atraumatic.  Cardiovascular: Normal rate, regular rhythm and normal heart sounds.   Respiratory: Effort normal and breath sounds normal.  GI: Soft. She exhibits no distension. There is tenderness. There is guarding. There is no rebound.  Neurological: She is alert and oriented to person, place, and time.  Skin: Skin is warm and dry.    MAU Course  Procedures  MDM Limited US  Assessment and Plan  Patient is a 25 y/o G1P0 at 29 weeks who presents  after falling and landing on her right abdomen. Signs and Symptoms do not sound concerning for abruption. However, we will get an ultrasound to rule-in abruption since her abdomen did sustain impact. We will observe on monitor for 4 hours and give tylenol for pain.   Update: US normal. EFM reassuring over 4 hours.. Will discharge home.  Clearance Cootsndrew Tyson 01/18/2016, 10:37 AM   Midwife attestation:  I have seen and examined this patient; I agree with above documentation in the resident's note.   Antonietta Jewelshia Hanback is a 25 y.o. G1P0000 reporting a fall while walking up stairs today. +FM, denies LOF, VB, contractions, vaginal discharge.  PE: BP 127/80 (BP Location: Right Arm)   Pulse 110   Temp 97.8 F (36.6 C) (Oral)   Resp 18   LMP 06/29/2015  Gen: calm comfortable, NAD Resp: normal effort, no distress Abd: gravid  ROS, labs, PMH reviewed NST reactive with 2 variables in 4 hours, reviewed tracing with Dr Shawnie PonsPratt  A/P: Follow up in office as scheduled Flexeril 5-10 TID PRN and Tylenol PRN  LEFTWICH-KIRBY, Glenn Christo, CNM  3:14 PM

## 2016-01-18 NOTE — MAU Note (Signed)
Urine in lab 

## 2016-01-18 NOTE — MAU Note (Signed)
Pt presents to MAU after she fell up the stairs. Pt reports around 0800 she was walking up the stairs, tripped and fell onto her right side. She denies bleeding or leaking of fluid. Pt is having pain on her right side, tried heat for the pain.

## 2016-01-23 ENCOUNTER — Ambulatory Visit (INDEPENDENT_AMBULATORY_CARE_PROVIDER_SITE_OTHER): Payer: Medicaid Other | Admitting: Advanced Practice Midwife

## 2016-01-23 ENCOUNTER — Encounter: Payer: Self-pay | Admitting: Advanced Practice Midwife

## 2016-01-23 DIAGNOSIS — H9191 Unspecified hearing loss, right ear: Secondary | ICD-10-CM

## 2016-01-23 DIAGNOSIS — Z3483 Encounter for supervision of other normal pregnancy, third trimester: Secondary | ICD-10-CM

## 2016-01-23 DIAGNOSIS — Z87891 Personal history of nicotine dependence: Secondary | ICD-10-CM | POA: Insufficient documentation

## 2016-01-23 NOTE — Patient Instructions (Signed)

## 2016-01-23 NOTE — Progress Notes (Signed)
   PRENATAL VISIT NOTE  Subjective:  Tamara Garza is a 25 y.o. G1P0000 at [redacted]w[redacted]d being seen today for ongoing prenatal care.  She is currently monitored for the following issues for this low-risk pregnancy and has Obesity; Hearing loss; RHINITIS, ALLERGIC; ASTHMA, INTERMITTENT; LGSIL (low grade squamous intraepithelial dysplasia); High risk HPV infection; Migraine; Back pain; Carpal tunnel syndrome of left wrist; Left breast mass; Supervision of normal pregnancy, antepartum; and Former smoker on her problem list.  Patient reports no complaints.  Contractions: Not present.  .  Movement: Present. Denies leaking of fluid.   The following portions of the patient's history were reviewed and updated as appropriate: allergies, current medications, past family history, past medical history, past social history, past surgical history and problem list. Problem list updated.  Objective:   Vitals:   01/23/16 1329  BP: 120/84  Pulse: (!) 108  Weight: 260 lb 12.8 oz (118.3 kg)    Fetal Status: Fetal Heart Rate (bpm): 145   Movement: Present     General:  Alert, oriented and cooperative. Patient is in no acute distress.  Skin: Skin is warm and dry. No rash noted.   Cardiovascular: Normal heart rate noted  Respiratory: Normal respiratory effort, no problems with respiration noted  Abdomen: Soft, gravid, appropriate for gestational age. Pain/Pressure: Present     Pelvic:  Cervical exam deferred        Extremities: Normal range of motion.  Edema: None  Mental Status: Normal mood and affect. Normal behavior. Normal judgment and thought content.   Assessment and Plan:  Pregnancy: G1P0000 at [redacted]w[redacted]d  1. Hearing loss of right ear, unspecified hearing loss type   2. Former smoker      Stopped a year ago  Preterm labor symptoms and general obstetric precautions including but not limited to vaginal bleeding, contractions, leaking of fluid and fetal movement were reviewed in detail with the  patient. Please refer to After Visit Summary for other counseling recommendations.  Return in about 2 weeks (around 02/06/2016), or Glucose 2hr Test this week, for [facility].   Aviva Signs, CNM

## 2016-01-25 ENCOUNTER — Ambulatory Visit (INDEPENDENT_AMBULATORY_CARE_PROVIDER_SITE_OTHER): Payer: Self-pay | Admitting: Pediatrics

## 2016-01-25 ENCOUNTER — Other Ambulatory Visit (INDEPENDENT_AMBULATORY_CARE_PROVIDER_SITE_OTHER): Payer: Medicaid Other

## 2016-01-25 DIAGNOSIS — Z3490 Encounter for supervision of normal pregnancy, unspecified, unspecified trimester: Secondary | ICD-10-CM

## 2016-01-25 DIAGNOSIS — Z23 Encounter for immunization: Secondary | ICD-10-CM | POA: Diagnosis present

## 2016-01-25 DIAGNOSIS — Z7681 Expectant parent(s) prebirth pediatrician visit: Secondary | ICD-10-CM

## 2016-01-25 DIAGNOSIS — Z3403 Encounter for supervision of normal first pregnancy, third trimester: Secondary | ICD-10-CM

## 2016-01-25 DIAGNOSIS — Z3480 Encounter for supervision of other normal pregnancy, unspecified trimester: Secondary | ICD-10-CM

## 2016-01-25 LAB — CBC
HCT: 34.3 % — ABNORMAL LOW (ref 35.0–45.0)
Hemoglobin: 11.3 g/dL — ABNORMAL LOW (ref 11.7–15.5)
MCH: 30.3 pg (ref 27.0–33.0)
MCHC: 32.9 g/dL (ref 32.0–36.0)
MCV: 92 fL (ref 80.0–100.0)
MPV: 10.7 fL (ref 7.5–12.5)
PLATELETS: 210 10*3/uL (ref 140–400)
RBC: 3.73 MIL/uL — ABNORMAL LOW (ref 3.80–5.10)
RDW: 13.8 % (ref 11.0–15.0)
WBC: 8.2 10*3/uL (ref 3.8–10.8)

## 2016-01-26 LAB — GLUCOSE TOLERANCE, 1 HOUR (50G) W/O FASTING: GLUCOSE, 1 HR, GESTATIONAL: 89 mg/dL (ref <140)

## 2016-01-26 LAB — HIV ANTIBODY (ROUTINE TESTING W REFLEX): HIV: NONREACTIVE

## 2016-01-26 LAB — SYPHILIS: RPR W/REFLEX TO RPR TITER AND TREPONEMAL ANTIBODIES, TRADITIONAL SCREENING AND DIAGNOSIS ALGORITHM

## 2016-01-27 NOTE — Progress Notes (Signed)
Prenatal counseling for impending newborn done--  1st child, prenatal care at 7wks, due 7/28 Z76.81

## 2016-01-31 ENCOUNTER — Encounter: Payer: Self-pay | Admitting: Student

## 2016-02-01 ENCOUNTER — Other Ambulatory Visit: Payer: Self-pay | Admitting: *Deleted

## 2016-02-01 MED ORDER — ALBUTEROL SULFATE HFA 108 (90 BASE) MCG/ACT IN AERS: 1.0000 | INHALATION_SPRAY | Freq: Four times a day (QID) | RESPIRATORY_TRACT | 0 refills | Status: DC | PRN

## 2016-02-01 NOTE — Progress Notes (Signed)
Pt sent My Chart message requesting refill of Albuterol inhaler due to losing it last week.  Refill approved by Dr. Vergie Living and Rx e-prescribed. Pt notified via My Chart message.

## 2016-02-06 NOTE — L&D Delivery Note (Signed)
Delivery Note At 1659 a viable female was delivered via SVD (Presentation: vertex, LOA  ).  APGAR: 9, 9; weight pending.   Placenta status: spontaneous, intact. Three vessel Cord: with the following complications: none.  Anesthesia:  Epidural Episiotomy: None Lacerations: 1st degree perineal x1 Suture Repair: vicryl x1 Est. Blood Loss (mL): 150  Mom to postpartum.  Baby to Couplet care / Skin to Skin.  Wendee Beavers, DO, PGY-1 03/30/2016, 5:12 PM  OB FELLOW DELIVERY ATTESTATION  I was gloved and present for the delivery in its entirety, and I agree with the above resident's note.    Ernestina Penna, MD 9:31 AM

## 2016-02-07 ENCOUNTER — Ambulatory Visit (INDEPENDENT_AMBULATORY_CARE_PROVIDER_SITE_OTHER): Payer: Medicaid Other | Admitting: Student

## 2016-02-07 ENCOUNTER — Other Ambulatory Visit: Payer: Self-pay | Admitting: Advanced Practice Midwife

## 2016-02-07 VITALS — BP 121/81 | HR 94 | Wt 264.0 lb

## 2016-02-07 DIAGNOSIS — O368130 Decreased fetal movements, third trimester, not applicable or unspecified: Secondary | ICD-10-CM | POA: Diagnosis not present

## 2016-02-07 MED ORDER — CONCEPT OB 130-92.4-1 MG PO CAPS: 1.0000 | ORAL_CAPSULE | Freq: Every day | ORAL | 11 refills | Status: DC

## 2016-02-07 NOTE — Progress Notes (Signed)
   PRENATAL VISIT NOTE  Subjective:  Tamara Garza is a 26 y.o. G1P0000 at [redacted]w[redacted]d being seen today for ongoing prenatal care.  She is currently monitored for the following issues for this low-risk pregnancy and has Obesity; Hearing loss; RHINITIS, ALLERGIC; ASTHMA, INTERMITTENT; LGSIL (low grade squamous intraepithelial dysplasia); High risk HPV infection; Migraine; Back pain; Carpal tunnel syndrome of left wrist; Left breast mass; Supervision of normal pregnancy, antepartum; and Former smoker on her problem list.  Patient reports no complaints.  Contractions: Not present. Vag. Bleeding: None.  Movement: Absent. Denies leaking of fluid.   The following portions of the patient's history were reviewed and updated as appropriate: allergies, current medications, past family history, past medical history, past social history, past surgical history and problem list. Problem list updated.  Objective:   Vitals:   02/07/16 1119  BP: 121/81  Pulse: 94  Weight: 264 lb (119.7 kg)    Fetal Status: Fetal Heart Rate (bpm): 140 Fundal Height: 32 cm Movement: Absent     General:  Alert, oriented and cooperative. Patient is in no acute distress.  Skin: Skin is warm and dry. No rash noted.   Cardiovascular: Normal heart rate noted  Respiratory: Normal respiratory effort, no problems with respiration noted  Abdomen: Soft, gravid, appropriate for gestational age. Pain/Pressure: Present     Pelvic:  Cervical exam performed        Extremities: Normal range of motion.  Edema: None  Mental Status: Normal mood and affect. Normal behavior. Normal judgment and thought content.   Assessment and Plan:  Pregnancy: G1P0000 at [redacted]w[redacted]d.  1. Decreased fetal movements in third trimester, single or unspecified fetus Patient states that she hasn't felt the baby moving like normal. NST performed; FHR is 145 and reactive with accelerations 10x10 and no decelerations. No contractions.  - Fetal nonstress test  Term labor  symptoms and general obstetric precautions including but not limited to vaginal bleeding, contractions, leaking of fluid and fetal movement were reviewed in detail with the patient. Reviewed importance of fetal kick counts and when to return to MAU if there is any doubt about fetal movements.   Please refer to After Visit Summary for other counseling recommendations.  Return in about 2 weeks (around 02/21/2016) for return OB.   Marylene Land, CNM

## 2016-02-07 NOTE — Patient Instructions (Signed)
Introduction Patient Name: ________________________________________________ Patient Due Date: ____________________ What is a fetal movement count? A fetal movement count is the number of times that you feel your baby move during a certain amount of time. This may also be called a fetal kick count. A fetal movement count is recommended for every pregnant woman. You may be asked to start counting fetal movements as early as week 28 of your pregnancy. Pay attention to when your baby is most active. You may notice your baby's sleep and wake cycles. You may also notice things that make your baby move more. You should do a fetal movement count:  When your baby is normally most active.  At the same time each day. A good time to count movements is while you are resting, after having something to eat and drink. How do I count fetal movements? 1. Find a quiet, comfortable area. Sit, or lie down on your side. 2. Write down the date, the start time and stop time, and the number of movements that you felt between those two times. Take this information with you to your health care visits. 3. For 2 hours, count kicks, flutters, swishes, rolls, and jabs. You should feel at least 10 movements during 2 hours. 4. You may stop counting after you have felt 10 movements. 5. If you do not feel 10 movements in 2 hours, have something to eat and drink. Then, keep resting and counting for 1 hour. If you feel at least 4 movements during that hour, you may stop counting. Contact a health care provider if:  You feel fewer than 4 movements in 2 hours.  Your baby is not moving like he or she usually does. Date: ____________ Start time: ____________ Stop time: ____________ Movements: ____________ Date: ____________ Start time: ____________ Stop time: ____________ Movements: ____________ Date: ____________ Start time: ____________ Stop time: ____________ Movements: ____________ Date: ____________ Start time: ____________  Stop time: ____________ Movements: ____________ Date: ____________ Start time: ____________ Stop time: ____________ Movements: ____________ Date: ____________ Start time: ____________ Stop time: ____________ Movements: ____________ Date: ____________ Start time: ____________ Stop time: ____________ Movements: ____________ Date: ____________ Start time: ____________ Stop time: ____________ Movements: ____________ Date: ____________ Start time: ____________ Stop time: ____________ Movements: ____________ This information is not intended to replace advice given to you by your health care provider. Make sure you discuss any questions you have with your health care provider. Document Released: 02/21/2006 Document Revised: 09/21/2015 Document Reviewed: 03/03/2015 Elsevier Interactive Patient Education  2017 Elsevier Inc[employer]  

## 2016-02-11 ENCOUNTER — Encounter (HOSPITAL_COMMUNITY): Payer: Self-pay

## 2016-02-11 ENCOUNTER — Inpatient Hospital Stay (HOSPITAL_COMMUNITY)
Admission: AD | Admit: 2016-02-11 | Discharge: 2016-02-11 | Disposition: A | Discharge status: Home / Self Care | Payer: Medicaid Other | Source: Ambulatory Visit | Attending: Obstetrics & Gynecology | Admitting: Obstetrics & Gynecology

## 2016-02-11 DIAGNOSIS — W19XXXA Unspecified fall, initial encounter: Secondary | ICD-10-CM | POA: Diagnosis not present

## 2016-02-11 DIAGNOSIS — O99513 Diseases of the respiratory system complicating pregnancy, third trimester: Secondary | ICD-10-CM | POA: Diagnosis not present

## 2016-02-11 DIAGNOSIS — Z87891 Personal history of nicotine dependence: Secondary | ICD-10-CM | POA: Diagnosis not present

## 2016-02-11 DIAGNOSIS — O26893 Other specified pregnancy related conditions, third trimester: Secondary | ICD-10-CM

## 2016-02-11 DIAGNOSIS — J45909 Unspecified asthma, uncomplicated: Secondary | ICD-10-CM | POA: Insufficient documentation

## 2016-02-11 DIAGNOSIS — S3981XA Other specified injuries of abdomen, initial encounter: Secondary | ICD-10-CM | POA: Diagnosis not present

## 2016-02-11 DIAGNOSIS — R1011 Right upper quadrant pain: Secondary | ICD-10-CM | POA: Insufficient documentation

## 2016-02-11 DIAGNOSIS — Z3A32 32 weeks gestation of pregnancy: Secondary | ICD-10-CM

## 2016-02-11 DIAGNOSIS — O9A213 Injury, poisoning and certain other consequences of external causes complicating pregnancy, third trimester: Secondary | ICD-10-CM

## 2016-02-11 DIAGNOSIS — Y92009 Unspecified place in unspecified non-institutional (private) residence as the place of occurrence of the external cause: Secondary | ICD-10-CM

## 2016-02-11 DIAGNOSIS — Y92019 Unspecified place in single-family (private) house as the place of occurrence of the external cause: Secondary | ICD-10-CM | POA: Diagnosis not present

## 2016-02-11 DIAGNOSIS — Z880 Allergy status to penicillin: Secondary | ICD-10-CM | POA: Diagnosis not present

## 2016-02-11 DIAGNOSIS — Z882 Allergy status to sulfonamides status: Secondary | ICD-10-CM | POA: Diagnosis not present

## 2016-02-11 DIAGNOSIS — W1839XA Other fall on same level, initial encounter: Secondary | ICD-10-CM

## 2016-02-11 LAB — URINALYSIS, ROUTINE W REFLEX MICROSCOPIC
Bilirubin Urine: NEGATIVE
GLUCOSE, UA: NEGATIVE mg/dL
HGB URINE DIPSTICK: NEGATIVE
Ketones, ur: 5 mg/dL — AB
Leukocytes, UA: NEGATIVE
Nitrite: NEGATIVE
Protein, ur: NEGATIVE mg/dL
Specific Gravity, Urine: 1.03 (ref 1.005–1.030)
pH: 5 (ref 5.0–8.0)

## 2016-02-11 MED ORDER — CYCLOBENZAPRINE HCL 10 MG PO TABS: 10.0000 mg | ORAL_TABLET | Freq: Two times a day (BID) | ORAL | 0 refills | Status: DC | PRN

## 2016-02-11 NOTE — MAU Note (Signed)
Patient presents s/p falling into corner of her dresser early morning yesterday, having right side pain right after, spotting yesterday, no spotting today, positive FM.

## 2016-02-11 NOTE — MAU Provider Note (Signed)
History   G1 @ 32.3 wks in with c/o RUQ abd pain from fall against a dresser yesterday. Denies vag bleeding or ROM. States good fetal movement.  CSN: [id number]  Arrival date & time 02/11/16  1358   None     Chief Complaint  Patient presents with  . Abdominal Pain    HPI  Past Medical History:  Diagnosis Date  . ACNE 04/04/2006   Qualifier: Diagnosis of  By: Abundio Miu    . Asthma   . ASTHMA, INTERMITTENT 04/04/2006   Qualifier: Diagnosis of  By: Lelon Perla MD, Vickki Muff    . Gallstones 2011  . 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 LGSIL (low grade squamous intraepithelial dysplasia)   . Migraine 11/21/2011  . RHINITIS, ALLERGIC 04/04/2006   Qualifier: Diagnosis of  By: Abundio Miu      Past Surgical History:  Procedure Laterality Date  . CHOLECYSTECTOMY    . COLPOSCOPY  2009    Family History  Problem Relation Age of Onset  . Multiple sclerosis Mother   . Hypertension Mother   . Diabetes Father   . Breast cancer Maternal Grandmother     Social History  Substance Use Topics  . Smoking status: Former Smoker    Packs/day: 0.25    Types: Cigarettes    Quit date: 2016  . Smokeless tobacco: Never Used  . Alcohol use 0.6 oz/week    1 Glasses of wine per week     Comment: occ.    OB History    Gravida Para Term Preterm AB Living   1 0 0 0 0 0   SAB TAB Ectopic Multiple Live Births   0 0 0 0        Review of Systems  Constitutional: Negative.   HENT: Negative.   Eyes: Negative.   Respiratory: Negative.   Cardiovascular: Negative.   Gastrointestinal: Positive for abdominal pain.  Endocrine: Negative.   Genitourinary: Negative.   Musculoskeletal: Negative.   Skin: Negative.   Allergic/Immunologic: Negative.   Neurological: Negative.   Hematological: Negative.   Psychiatric/Behavioral: Negative.     Allergies  Eggs or egg-derived products; Penicillins; and Sulfonamide derivatives  Home Medications    BP 143/85   Pulse 117   Temp 99.5 F  (37.5 C) (Oral)   Resp 18   Ht 5\' 5"  (1.651 m)   Wt 260 lb (117.9 kg)   LMP 06/29/2015   BMI 43.27 kg/m   Physical Exam  Constitutional: She is oriented to person, place, and time. She appears well-developed and well-nourished.  HENT:  Head: Normocephalic.  Neck: Normal range of motion.  Cardiovascular: Normal rate, regular rhythm, normal heart sounds and intact distal pulses.   Pulmonary/Chest: Effort normal and breath sounds normal.  Abdominal: Soft. Bowel sounds are normal.  Genitourinary: Vagina normal and uterus normal.  Musculoskeletal: Normal range of motion.  Neurological: She is alert and oriented to person, place, and time. She has normal reflexes.  Skin: Skin is warm and dry.  Psychiatric: She has a normal mood and affect. Her behavior is normal. Judgment and thought content normal.    MAU Course  Procedures (including critical care time)  Labs Reviewed  URINALYSIS, ROUTINE W REFLEX MICROSCOPIC   No results found.   1. Fall as cause of accidental injury in home as place of occurrence, initial encounter       MDM  SVE c/thick/post/high. FHR pattern reassuring.no uc's. abd soft non tender. Will treat with flexiril and d/c home.

## 2016-02-11 NOTE — MAU Note (Signed)
electronic signature not working, sign signed on paper for discharge

## 2016-02-13 ENCOUNTER — Telehealth: Payer: Self-pay | Admitting: *Deleted

## 2016-02-13 NOTE — Telephone Encounter (Signed)
Tamara Garza called back and call transferred to nurse. I informed of need for bp check and she agreed to tomorrow 1pm.

## 2016-02-13 NOTE — Telephone Encounter (Signed)
Per Dr. Penne Lash message need to call patient and make appt for bp check this week due to 1 elevated bp in MAU.   I called Tamara Garza and left a message we need to make her an appt this week, please call our office.

## 2016-02-21 ENCOUNTER — Encounter: Payer: Self-pay | Admitting: Advanced Practice Midwife

## 2016-02-29 ENCOUNTER — Ambulatory Visit (INDEPENDENT_AMBULATORY_CARE_PROVIDER_SITE_OTHER): Payer: Medicaid Other | Admitting: Student

## 2016-02-29 DIAGNOSIS — Z3400 Encounter for supervision of normal first pregnancy, unspecified trimester: Secondary | ICD-10-CM

## 2016-02-29 DIAGNOSIS — Z3403 Encounter for supervision of normal first pregnancy, third trimester: Secondary | ICD-10-CM

## 2016-02-29 NOTE — Progress Notes (Signed)
   PRENATAL VISIT NOTE  Subjective:  Tamara Garza is a 26 y.o. G1P0000 at [redacted]w[redacted]d being seen today for ongoing prenatal care.  She is currently monitored for the following issues for this low-risk pregnancy and has Obesity; Hearing loss; RHINITIS, ALLERGIC; ASTHMA, INTERMITTENT; LGSIL (low grade squamous intraepithelial dysplasia); High risk HPV infection; Migraine; Back pain; Carpal tunnel syndrome of left wrist; Left breast mass; Supervision of normal pregnancy, antepartum; and Former smoker on her problem list.  Patient reports no complaints.   .  .  Movement: Present. Denies leaking of fluid.   The following portions of the patient's history were reviewed and updated as appropriate: allergies, current medications, past family history, past medical history, past social history, past surgical history and problem list. Problem list updated.  Objective:   Vitals:   02/29/16 1322  BP: 120/80  Pulse: 67  Weight: 266 lb 11.2 oz (121 kg)    Fetal Status: Fetal Heart Rate (bpm): 141 Fundal Height: 35 cm Movement: Present     General:  Alert, oriented and cooperative. Patient is in no acute distress.  Skin: Skin is warm and dry. No rash noted.   Cardiovascular: Normal heart rate noted  Respiratory: Normal respiratory effort, no problems with respiration noted  Abdomen: Soft, gravid, appropriate for gestational age. Pain/Pressure: Present     Pelvic:  Cervical exam deferred        Extremities: Normal range of motion.     Mental Status: Normal mood and affect. Normal behavior. Normal judgment and thought content.   Assessment and Plan:  Pregnancy: G1P0000 at [redacted]w[redacted]d  1. Supervision of normal first pregnancy, antepartum Patient getting more and more anxious as her due date approaches. She has many questions today about an elective c-section. I explained that c-sections are only performed when medically necessary and that the healthiest option for her was a NSVD. I provided guidance on the types  of anesthesia available and reassured her that the labor staff, APS and attendings would all be supportive of her decisions in labor for pain relief. I also reassured her that her baby was measuring normally, and that, by [place], he would not be too large for her to delivery vaginally.    Term labor symptoms and general obstetric precautions including but not limited to vaginal bleeding, contractions, leaking of fluid and fetal movement were reviewed in detail with the patient. Please refer to After Visit Summary for other counseling recommendations.  Return in about 2 weeks (around 03/14/2016) for ROB.  GBS and GC CT cultures at next visit.    Marylene Land, CNM

## 2016-03-15 ENCOUNTER — Ambulatory Visit (INDEPENDENT_AMBULATORY_CARE_PROVIDER_SITE_OTHER): Payer: Medicaid Other | Admitting: Medical

## 2016-03-15 ENCOUNTER — Encounter: Payer: Self-pay | Admitting: Medical

## 2016-03-15 ENCOUNTER — Other Ambulatory Visit (HOSPITAL_COMMUNITY)
Admission: RE | Admit: 2016-03-15 | Discharge: 2016-03-15 | Disposition: A | Discharge status: Home / Self Care | Payer: Medicaid Other | Source: Ambulatory Visit | Attending: Medical | Admitting: Medical

## 2016-03-15 VITALS — BP 112/80 | HR 100 | Wt 265.8 lb

## 2016-03-15 DIAGNOSIS — Z3483 Encounter for supervision of other normal pregnancy, third trimester: Secondary | ICD-10-CM

## 2016-03-15 DIAGNOSIS — Z3A37 37 weeks gestation of pregnancy: Secondary | ICD-10-CM | POA: Insufficient documentation

## 2016-03-15 LAB — OB RESULTS CONSOLE GC/CHLAMYDIA: Gonorrhea: NEGATIVE

## 2016-03-15 NOTE — Progress Notes (Signed)
   PRENATAL VISIT NOTE  Subjective:  Ciara Moroyoqui is a 26 y.o. G1P0000 at [redacted]w[redacted]d being seen today for ongoing prenatal care.  She is currently monitored for the following issues for this high-risk pregnancy and has Obesity; Hearing loss; RHINITIS, ALLERGIC; ASTHMA, INTERMITTENT; LGSIL (low grade squamous intraepithelial dysplasia); High risk HPV infection; Migraine; Back pain; Carpal tunnel syndrome of left wrist; Left breast mass; Supervision of normal pregnancy, antepartum; and Former smoker on her problem list.  Patient reports vaginal odor.  Contractions: Irritability. Vag. Bleeding: None.  Movement: Present. Denies leaking of fluid.   The following portions of the patient's history were reviewed and updated as appropriate: allergies, current medications, past family history, past medical history, past social history, past surgical history and problem list. Problem list updated.  Objective:   Vitals:   03/15/16 1114  BP: 112/80  Pulse: 100  Weight: 265 lb 12.8 oz (120.6 kg)    Fetal Status: Fetal Heart Rate (bpm): 142 Fundal Height: 38 cm Movement: Present     General:  Alert, oriented and cooperative. Patient is in no acute distress.  Skin: Skin is warm and dry. No rash noted.   Cardiovascular: Normal heart rate noted  Respiratory: Normal respiratory effort, no problems with respiration noted  Abdomen: Soft, gravid, appropriate for gestational age. Pain/Pressure: Present     Pelvic:  Cervical exam deferred        Extremities: Normal range of motion.  Edema: None  Mental Status: Normal mood and affect. Normal behavior. Normal judgment and thought content.   Assessment and Plan:  Pregnancy: G1P0000 at [redacted]w[redacted]d  1. Encounter for supervision of other normal pregnancy in third trimester - Culture, beta strep (group b only) - Cervicovaginal ancillary only - Patient will be contacted with any positive results, advised that GBS status will be discussed at next visit - Patient declined  cervical exam today due to concern about pain with exam - Patient requested letter for [facility] stating that she will need a car seat for her baby, they provide car seats at a discounted price for patient's who cannot afford it otherwise. Letter provided  Term labor symptoms and general obstetric precautions including but not limited to vaginal bleeding, contractions, leaking of fluid and fetal movement were reviewed in detail with the patient. Please refer to After Visit Summary for other counseling recommendations.  Return in about 1 week (around 03/22/2016) for LOB.   Marny Lowenstein, PA-C

## 2016-03-15 NOTE — Patient Instructions (Signed)
Introduction Patient Name: ________________________________________________ Patient Due Date: ____________________ What is a fetal movement count? A fetal movement count is the number of times that you feel your baby move during a certain amount of time. This may also be called a fetal kick count. A fetal movement count is recommended for every pregnant woman. You may be asked to start counting fetal movements as early as week 28 of your pregnancy. Pay attention to when your baby is most active. You may notice your baby's sleep and wake cycles. You may also notice things that make your baby move more. You should do a fetal movement count:  When your baby is normally most active.  At the same time each day. A good time to count movements is while you are resting, after having something to eat and drink. How do I count fetal movements? 1. Find a quiet, comfortable area. Sit, or lie down on your side. 2. Write down the date, the start time and stop time, and the number of movements that you felt between those two times. Take this information with you to your health care visits. 3. For 2 hours, count kicks, flutters, swishes, rolls, and jabs. You should feel at least 10 movements during 2 hours. 4. You may stop counting after you have felt 10 movements. 5. If you do not feel 10 movements in 2 hours, have something to eat and drink. Then, keep resting and counting for 1 hour. If you feel at least 4 movements during that hour, you may stop counting. Contact a health care provider if:  You feel fewer than 4 movements in 2 hours.  Your baby is not moving like he or she usually does. Date: ____________ Start time: ____________ Stop time: ____________ Movements: ____________ Date: ____________ Start time: ____________ Stop time: ____________ Movements: ____________ Date: ____________ Start time: ____________ Stop time: ____________ Movements: ____________ Date: ____________ Start time: ____________  Stop time: ____________ Movements: ____________ Date: ____________ Start time: ____________ Stop time: ____________ Movements: ____________ Date: ____________ Start time: ____________ Stop time: ____________ Movements: ____________ Date: ____________ Start time: ____________ Stop time: ____________ Movements: ____________ Date: ____________ Start time: ____________ Stop time: ____________ Movements: ____________ Date: ____________ Start time: ____________ Stop time: ____________ Movements: ____________ This information is not intended to replace advice given to you by your health care provider. Make sure you discuss any questions you have with your health care provider. Document Released: 02/21/2006 Document Revised: 09/21/2015 Document Reviewed: 03/03/2015 Elsevier Interactive Patient Education  2017 [employer] Braxton Hicks Contractions Contractions of the uterus can occur throughout pregnancy. Contractions are not always a sign that you are in labor.  WHAT ARE BRAXTON HICKS CONTRACTIONS?  Contractions that occur before labor are called Braxton Hicks contractions, or false labor. Toward the end of pregnancy (32-34 weeks), these contractions can develop more often and may become more forceful. This is not true labor because these contractions do not result in opening (dilatation) and thinning of the cervix. They are sometimes difficult to tell apart from true labor because these contractions can be forceful and people have different pain tolerances. You should not feel embarrassed if you go to the hospital with false labor. Sometimes, the only way to tell if you are in true labor is for your health care provider to look for changes in the cervix. If there are no prenatal problems or other health problems associated with the pregnancy, it is completely safe to be sent home with false labor and await the onset of true labor.   HOW CAN YOU TELL THE DIFFERENCE BETWEEN TRUE AND FALSE LABOR? False Labor     The contractions of false labor are usually shorter and not as hard as those of true labor.   The contractions are usually irregular.   The contractions are often felt in the front of the lower abdomen and in the groin.   The contractions may go away when you walk around or change positions while lying down.   The contractions get weaker and are shorter lasting as time goes on.   The contractions do not usually become progressively stronger, regular, and closer together as with true labor.  True Labor   Contractions in true labor last 30-70 seconds, become very regular, usually become more intense, and increase in frequency.   The contractions do not go away with walking.   The discomfort is usually felt in the top of the uterus and spreads to the lower abdomen and low back.   True labor can be determined by your health care provider with an exam. This will show that the cervix is dilating and getting thinner.  WHAT TO REMEMBER  Keep up with your usual exercises and follow other instructions given by your health care provider.   Take medicines as directed by your health care provider.   Keep your regular prenatal appointments.   Eat and drink lightly if you think you are going into labor.   If Braxton Hicks contractions are making you uncomfortable:   Change your position from lying down or resting to walking, or from walking to resting.   Sit and rest in a tub of warm water.   Drink 2-3 glasses of water. Dehydration may cause these contractions.   Do slow and deep breathing several times an hour.  WHEN SHOULD I SEEK IMMEDIATE MEDICAL CARE? Seek immediate medical care if:  Your contractions become stronger, more regular, and closer together.   You have fluid leaking or gushing from your vagina.   You have a fever.   You pass blood-tinged mucus.   You have vaginal bleeding.   You have continuous abdominal pain.   You have low back pain  that you never had before.   You feel your baby's head pushing down and causing pelvic pressure.   Your baby is not moving as much as it used to.  This information is not intended to replace advice given to you by your health care provider. Make sure you discuss any questions you have with your health care provider. Document Released: 01/22/2005 Document Revised: 05/16/2015 Document Reviewed: 11/03/2012 Elsevier Interactive Patient Education  2017 [employer]  

## 2016-03-16 LAB — CERVICOVAGINAL ANCILLARY ONLY
BACTERIAL VAGINITIS: NEGATIVE
CANDIDA VAGINITIS: POSITIVE — AB
Chlamydia: NEGATIVE
NEISSERIA GONORRHEA: NEGATIVE
Trichomonas: NEGATIVE

## 2016-03-17 LAB — CULTURE, BETA STREP (GROUP B ONLY)

## 2016-03-21 ENCOUNTER — Ambulatory Visit (INDEPENDENT_AMBULATORY_CARE_PROVIDER_SITE_OTHER): Payer: Medicaid Other | Admitting: Certified Nurse Midwife

## 2016-03-21 ENCOUNTER — Ambulatory Visit: Payer: Medicaid Other | Admitting: Clinical

## 2016-03-21 VITALS — BP 115/80 | HR 102 | Wt 268.3 lb

## 2016-03-21 DIAGNOSIS — O9989 Other specified diseases and conditions complicating pregnancy, childbirth and the puerperium: Secondary | ICD-10-CM

## 2016-03-21 DIAGNOSIS — F4323 Adjustment disorder with mixed anxiety and depressed mood: Secondary | ICD-10-CM

## 2016-03-21 DIAGNOSIS — O26893 Other specified pregnancy related conditions, third trimester: Secondary | ICD-10-CM

## 2016-03-21 DIAGNOSIS — M549 Dorsalgia, unspecified: Secondary | ICD-10-CM

## 2016-03-21 DIAGNOSIS — O99820 Streptococcus B carrier state complicating pregnancy: Secondary | ICD-10-CM

## 2016-03-21 DIAGNOSIS — Z3400 Encounter for supervision of normal first pregnancy, unspecified trimester: Secondary | ICD-10-CM

## 2016-03-21 DIAGNOSIS — Z3403 Encounter for supervision of normal first pregnancy, third trimester: Secondary | ICD-10-CM

## 2016-03-21 MED ORDER — COMFORT FIT MATERNITY SUPP LG MISC: 1.0000 "application " | Freq: Every day | 0 refills | Status: DC

## 2016-03-21 NOTE — BH Specialist Note (Signed)
Session Start time: 9:39   End Time: 9:41 Total Time:  2 minutes Type of Service: Behavioral Health - Individual/Family Interpreter: No.   Interpreter Name & Language: n/a # [facility] Visits July 2017-June 2018: 2nd(less than 5 minute)  SUBJECTIVE: Tamara Garza is a 26 y.o. female  Pt. was referred by f/u for:  anxiety and depression. Pt. reports the following symptoms/concerns: Pt does not have time to talk today, excited about having baby in two weeks; will accept Postpartum Planner and consider mom-led support group postpartum. Duration of problem:  - Severity: moderate Previous treatment: none  OBJECTIVE: Mood: Appropriate & Affect: Appropriate Risk of harm to self or others: no known risk of harm to self or others Assessments administered: PHQ9: 7/ GAD7: 10  LIFE CONTEXT:  Family & Social: - (Who,family proximity, relationship, friends) Product/process development scientist Work: - (Where, how often, or financial support) Self-Care: - (Exercise, sleep, eat, substances) Life changes: Due with first baby in two weeks What is important to pt/family (values): -  GOALS ADDRESSED:  -No goals addressed today  INTERVENTIONS: Supportive   ASSESSMENT:  Pt currently experiencing Adjustment disorder with mixed anxious and depressed mood.  Pt may benefit from additional peer support.   PLAN: 1. F/U with behavioral health clinician: As needed 2. Behavioral Health meds: none 3. Behavioral recommendations:  -Take Postpartum Planner home today, consider using as a guide to help prepare for first baby -Consider Mom Talk support group postpartum 4. Referral: [employer] 5. From scale of 1-10, how likely are you to follow plan: -  Woc-Behavioral Health Clinician  Behavioral Health Clinician  Marlon Pel: no  Depression screen [facility] 2/9 03/21/2016 03/15/2016 02/29/2016 02/07/2016 01/23/2016  Decreased Interest 1 1 - 0 1  Down, Depressed, Hopeless 1 1 - 1 2  PHQ - 2 Score 2 2 - 1 3  Altered sleeping 2 2 2 1 3    Tired, decreased energy 2 2 2 3 2   Change in appetite 1 1 2 3  0  Feeling bad or failure about yourself  0 0 - 0 0  Trouble concentrating 0 0 - 0 0  Moving slowly or fidgety/restless 0 0 - 1 0  Suicidal thoughts 0 0 - 0 0  PHQ-9 Score 7 7 - 9 8   GAD 7 : Generalized Anxiety Score 03/21/2016 03/15/2016 02/29/2016 02/07/2016  Nervous, Anxious, on Edge 2 2 2 2   Control/stop worrying 0 2 0 2  Worry too much - different things 0 1 0 1  Trouble relaxing 3 2 0 2  Restless 1 2 0 0  Easily annoyed or irritable 3 2 2 2   Afraid - awful might happen 1 1 2 1   Total GAD 7 Score 10 12 6 10   Anxiety Difficulty - - Not difficult at all -

## 2016-03-21 NOTE — Progress Notes (Signed)
Subjective:  Tamara Garza is a 26 y.o. G1P0000 at [redacted]w[redacted]d being seen today for ongoing prenatal care.  She is currently monitored for the following issues for this low-risk pregnancy and has Obesity; Hearing loss; RHINITIS, ALLERGIC; ASTHMA, INTERMITTENT; LGSIL (low grade squamous intraepithelial dysplasia); High risk HPV infection; Migraine; Back pain; Carpal tunnel syndrome of left wrist; Left breast mass; Supervision of normal pregnancy, antepartum; and Former smoker on her problem list.  Patient reports backache.  Contractions: Irritability. Vag. Bleeding: None.  Movement: Present. Denies leaking of fluid.   The following portions of the patient's history were reviewed and updated as appropriate: allergies, current medications, past family history, past medical history, past social history, past surgical history and problem list. Problem list updated.  Objective:   Vitals:   03/21/16 0914  BP: 115/80  Pulse: (!) 102  Weight: 268 lb 4.8 oz (121.7 kg)    Fetal Status: Fetal Heart Rate (bpm): 140 Fundal Height: 39 cm Movement: Present  Presentation: Vertex  General:  Alert, oriented and cooperative. Patient is in no acute distress.  Skin: Skin is warm and dry. No rash noted.   Cardiovascular: Normal heart rate noted  Respiratory: Normal respiratory effort, no problems with respiration noted  Abdomen: Soft, gravid, appropriate for gestational age. Pain/Pressure: Present     Pelvic: Vag. Bleeding: None     Cervical exam deferred        Extremities: Normal range of motion.  Edema: None  Mental Status: Normal mood and affect. Normal behavior. Normal judgment and thought content.   Urinalysis:      Assessment and Plan:  Pregnancy: G1P0000 at [redacted]w[redacted]d  1. Supervision of normal first pregnancy, antepartum   2. Back pain affecting pregnancy in third trimester -Rx maternity support belt  Term labor symptoms and general obstetric precautions including but not limited to vaginal bleeding,  contractions, leaking of fluid and fetal movement were reviewed in detail with the patient. Please refer to After Visit Summary for other counseling recommendations.  Return in about 1 week (around 03/28/2016).   Donette Larry, CNM

## 2016-03-21 NOTE — Progress Notes (Signed)
Patient needs to see Tamara Garza

## 2016-03-23 ENCOUNTER — Telehealth: Payer: Self-pay | Admitting: *Deleted

## 2016-03-23 ENCOUNTER — Other Ambulatory Visit: Payer: Self-pay | Admitting: Medical

## 2016-03-23 DIAGNOSIS — B3731 Acute candidiasis of vulva and vagina: Secondary | ICD-10-CM

## 2016-03-23 DIAGNOSIS — B373 Candidiasis of vulva and vagina: Secondary | ICD-10-CM

## 2016-03-23 MED ORDER — TERCONAZOLE 0.8 % VA CREA: 1.0000 | TOPICAL_CREAM | Freq: Every day | VAGINAL | 0 refills | Status: DC

## 2016-03-23 NOTE — Telephone Encounter (Signed)
Pharmacy left massage, they cannot fill script for elastic maternity belt, patient must take prescription to a medical supply store. Call for any questions.

## 2016-03-26 ENCOUNTER — Other Ambulatory Visit: Payer: Self-pay

## 2016-03-26 NOTE — Telephone Encounter (Signed)
Patient has yeast infection. Cream has been called into patient pharmacy.

## 2016-03-28 ENCOUNTER — Encounter (HOSPITAL_COMMUNITY): Payer: Self-pay | Admitting: *Deleted

## 2016-03-28 ENCOUNTER — Encounter: Payer: Self-pay | Admitting: Obstetrics and Gynecology

## 2016-03-28 ENCOUNTER — Inpatient Hospital Stay (HOSPITAL_COMMUNITY)
Admission: AD | Admit: 2016-03-28 | Discharge: 2016-04-01 | DRG: 775 | Disposition: A | Discharge status: Home / Self Care | Payer: Medicaid Other | Source: Ambulatory Visit | Attending: Obstetrics and Gynecology | Admitting: Obstetrics and Gynecology

## 2016-03-28 DIAGNOSIS — O4290 Premature rupture of membranes, unspecified as to length of time between rupture and onset of labor, unspecified weeks of gestation: Secondary | ICD-10-CM | POA: Diagnosis present

## 2016-03-28 DIAGNOSIS — O99324 Drug use complicating childbirth: Secondary | ICD-10-CM | POA: Diagnosis present

## 2016-03-28 DIAGNOSIS — Z87891 Personal history of nicotine dependence: Secondary | ICD-10-CM

## 2016-03-28 DIAGNOSIS — Z6841 Body Mass Index (BMI) 40.0 and over, adult: Secondary | ICD-10-CM | POA: Diagnosis not present

## 2016-03-28 DIAGNOSIS — O4292 Full-term premature rupture of membranes, unspecified as to length of time between rupture and onset of labor: Secondary | ICD-10-CM | POA: Diagnosis present

## 2016-03-28 DIAGNOSIS — O99214 Obesity complicating childbirth: Secondary | ICD-10-CM | POA: Diagnosis present

## 2016-03-28 DIAGNOSIS — Z3A39 39 weeks gestation of pregnancy: Secondary | ICD-10-CM

## 2016-03-28 DIAGNOSIS — Z880 Allergy status to penicillin: Secondary | ICD-10-CM

## 2016-03-28 DIAGNOSIS — Z3400 Encounter for supervision of normal first pregnancy, unspecified trimester: Secondary | ICD-10-CM

## 2016-03-28 DIAGNOSIS — O99824 Streptococcus B carrier state complicating childbirth: Secondary | ICD-10-CM | POA: Diagnosis present

## 2016-03-28 DIAGNOSIS — F1290 Cannabis use, unspecified, uncomplicated: Secondary | ICD-10-CM | POA: Diagnosis present

## 2016-03-28 DIAGNOSIS — O4212 Full-term premature rupture of membranes, onset of labor more than 24 hours following rupture: Secondary | ICD-10-CM | POA: Diagnosis not present

## 2016-03-28 DIAGNOSIS — O4202 Full-term premature rupture of membranes, onset of labor within 24 hours of rupture: Secondary | ICD-10-CM | POA: Diagnosis present

## 2016-03-28 DIAGNOSIS — J45909 Unspecified asthma, uncomplicated: Secondary | ICD-10-CM | POA: Diagnosis present

## 2016-03-28 DIAGNOSIS — O9952 Diseases of the respiratory system complicating childbirth: Secondary | ICD-10-CM | POA: Diagnosis present

## 2016-03-28 DIAGNOSIS — O99820 Streptococcus B carrier state complicating pregnancy: Secondary | ICD-10-CM

## 2016-03-28 HISTORY — DX: Unspecified abnormal cytological findings in specimens from vagina: R87.629

## 2016-03-28 HISTORY — DX: Unspecified infectious disease: B99.9

## 2016-03-28 LAB — CBC
HCT: 36.7 % (ref 36.0–46.0)
Hemoglobin: 12.7 g/dL (ref 12.0–15.0)
MCH: 30.8 pg (ref 26.0–34.0)
MCHC: 34.6 g/dL (ref 30.0–36.0)
MCV: 88.9 fL (ref 78.0–100.0)
PLATELETS: 195 10*3/uL (ref 150–400)
RBC: 4.13 MIL/uL (ref 3.87–5.11)
RDW: 14.1 % (ref 11.5–15.5)
WBC: 8.1 10*3/uL (ref 4.0–10.5)

## 2016-03-28 LAB — OB RESULTS CONSOLE GBS: STREP GROUP B AG: POSITIVE

## 2016-03-28 LAB — ABO/RH: ABO/RH(D): O POS

## 2016-03-28 LAB — TYPE AND SCREEN
ABO/RH(D): O POS
ANTIBODY SCREEN: NEGATIVE

## 2016-03-28 MED ORDER — CEFAZOLIN IN D5W 1 GM/50ML IV SOLN
1.0000 g | Freq: Three times a day (TID) | INTRAVENOUS | Status: DC
  Administered 2016-03-28 – 2016-03-30 (×6): 1 g via INTRAVENOUS
  Filled 2016-03-28 (×7): qty 50

## 2016-03-28 MED ORDER — OXYCODONE-ACETAMINOPHEN 5-325 MG PO TABS: 2.0000 | ORAL_TABLET | ORAL | Status: DC | PRN

## 2016-03-28 MED ORDER — SOD CITRATE-CITRIC ACID 500-334 MG/5ML PO SOLN: 30.0000 mL | ORAL | Status: DC | PRN

## 2016-03-28 MED ORDER — LACTATED RINGERS IV SOLN
500.0000 mL | INTRAVENOUS | Status: DC | PRN
  Administered 2016-03-28 – 2016-03-29 (×2): 500 mL via INTRAVENOUS

## 2016-03-28 MED ORDER — OXYTOCIN 40 UNITS IN LACTATED RINGERS INFUSION - SIMPLE MED
2.5000 [IU]/h | INTRAVENOUS | Status: DC
  Filled 2016-03-28: qty 1000

## 2016-03-28 MED ORDER — TERBUTALINE SULFATE 1 MG/ML IJ SOLN
0.2500 mg | Freq: Once | INTRAMUSCULAR | Status: DC | PRN
  Filled 2016-03-28: qty 1

## 2016-03-28 MED ORDER — FLEET ENEMA 7-19 GM/118ML RE ENEM: 1.0000 | ENEMA | RECTAL | Status: DC | PRN

## 2016-03-28 MED ORDER — OXYCODONE-ACETAMINOPHEN 5-325 MG PO TABS: 1.0000 | ORAL_TABLET | ORAL | Status: DC | PRN

## 2016-03-28 MED ORDER — LACTATED RINGERS IV SOLN
INTRAVENOUS | Status: DC
  Administered 2016-03-28 (×2): via INTRAVENOUS
  Administered 2016-03-29: 125 mL/h via INTRAVENOUS
  Administered 2016-03-30: 12:00:00 via INTRAVENOUS

## 2016-03-28 MED ORDER — ONDANSETRON HCL 4 MG/2ML IJ SOLN
4.0000 mg | Freq: Four times a day (QID) | INTRAMUSCULAR | Status: DC | PRN
  Administered 2016-03-29 – 2016-03-30 (×2): 4 mg via INTRAVENOUS
  Filled 2016-03-28 (×2): qty 2

## 2016-03-28 MED ORDER — LIDOCAINE HCL (PF) 1 % IJ SOLN
30.0000 mL | INTRAMUSCULAR | Status: DC | PRN
  Filled 2016-03-28: qty 30

## 2016-03-28 MED ORDER — CEFAZOLIN SODIUM-DEXTROSE 2-4 GM/100ML-% IV SOLN
2.0000 g | Freq: Once | INTRAVENOUS | Status: AC
  Administered 2016-03-28: 2 g via INTRAVENOUS
  Filled 2016-03-28: qty 100

## 2016-03-28 MED ORDER — ALBUTEROL SULFATE (2.5 MG/3ML) 0.083% IN NEBU: 3.0000 mL | INHALATION_SOLUTION | Freq: Four times a day (QID) | RESPIRATORY_TRACT | Status: DC | PRN

## 2016-03-28 MED ORDER — ACETAMINOPHEN 325 MG PO TABS: 650.0000 mg | ORAL_TABLET | ORAL | Status: DC | PRN

## 2016-03-28 MED ORDER — OXYTOCIN BOLUS FROM INFUSION: 500.0000 mL | Freq: Once | INTRAVENOUS | Status: DC

## 2016-03-28 NOTE — Anesthesia Pain Management Evaluation Note (Signed)
  CRNA Pain Management Visit Note  Patient: Tamara Garza, 26 y.o., female  "Hello I am a member of the anesthesia team at [facility]. We have an anesthesia team available at all times to provide care throughout the hospital, including epidural management and anesthesia for C-section. I don't know your plan for the delivery whether it a natural birth, water birth, IV sedation, nitrous supplementation, doula or epidural, but we want to meet your pain goals."   1.Was your pain managed to your expectations on prior hospitalizations?   No prior hospitalizations  2.What is your expectation for pain management during this hospitalization?     Labor support without medications and Nitrous Oxide  3.How can we help you reach that goal?   Record the patient's initial score and the patient's pain goal.   Pain: 8  Pain Goal: 10 [facility] wants you to be able to say your pain was always managed very well.  Tamara Garza 03/28/2016

## 2016-03-28 NOTE — Progress Notes (Signed)
Tamara Garza is a 26 y.o. G1P0000 at [redacted]w[redacted]d admitted for PROM  Subjective:  feeling some contractions. Getting more uncomfortable   Objective: BP 132/82   Pulse 93   Temp 98.3 F (36.8 C) (Oral)   Resp 18   Ht 5\' 5"  (1.651 m)   Wt 268 lb (121.6 kg)   LMP 06/29/2015   SpO2 100%   BMI 44.60 kg/m  No intake/output data recorded. No intake/output data recorded.  FHT:  FHR: 125 bpm, variability: minimal ,  accelerations:  Abscent,  decelerations:  Present variable  UC:   regular, every 5+ minutes SVE:   Dilation: 1.5 Effacement (%): 80 Station: -2, -3 Exam by:: jolynn Dr. Macon Large in the room, and discussing the fetal tracing with the patient. Would like to proceed with augmentation at this time. Reviewed options with the patient. Questions answered. Patient would like to proceed with nipple stimulation Labs: Lab Results  Component Value Date   WBC 8.1 03/28/2016   HGB 12.7 03/28/2016   HCT 36.7 03/28/2016   MCV 88.9 03/28/2016   PLT 195 03/28/2016    Assessment / Plan: Protracted latent phase  Labor: latent phase  Preeclampsia:  NA Fetal Wellbeing:  Category II Pain Control:  Labor support without medications, IV pain meds and Nitrous Oxide I/D:  n/a Anticipated MOD:  NSVD  Tawnya Crook 03/28/2016, 4:48 PM

## 2016-03-28 NOTE — MAU Note (Signed)
Pt does not tolerate needles/IV well.  Took a lot of talking to get her to calm down to allow me to start her IV, but was able to obtain IV with one attempt.

## 2016-03-28 NOTE — Progress Notes (Signed)
Pt refusing cervical exam and augmentation at this time--provider aware

## 2016-03-28 NOTE — MAU Note (Signed)
Assisted to rm via wc.  Reports loss of bladder control around 5, has been having ? Leaking ever since, fluid is clear with a yellow tint.  No bleeding.  Mild contractions.

## 2016-03-28 NOTE — H&P (Signed)
LABOR AND DELIVERY ADMISSION HISTORY AND PHYSICAL NOTE  Tamara Garza is a 26 y.o. female G1P0000 with IUP at [redacted]w[redacted]d by 7 wk [place] presenting for PROM since 0500 this morning. Patient was getting up to use bathroom but experienced leakage of persistent yellow fluid without contractions. Denies h/o fevers, chills, n/v, SOB, h/a, LE edema, RUQ pain, of flank pain. She reports positive fetal movement. She denies vaginal bleeding.  Prenatal History/Complications:   Past Medical History: Past Medical History:  Diagnosis Date  . ACNE 04/04/2006   Qualifier: Diagnosis of  By: Abundio Miu    . Asthma   . ASTHMA, INTERMITTENT 04/04/2006   Qualifier: Diagnosis of  By: Lelon Perla MD, Vickki Muff    . Gallstones 2011  . 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 Infection    UTI  . LGSIL (low grade squamous intraepithelial dysplasia)   . Migraine 11/21/2011  . RHINITIS, ALLERGIC 04/04/2006   Qualifier: Diagnosis of  By: Abundio Miu    . Vaginal Pap smear, abnormal    ok since colpo    Past Surgical History: Past Surgical History:  Procedure Laterality Date  . CHOLECYSTECTOMY    . COLPOSCOPY  2009    Obstetrical History: OB History    Gravida Para Term Preterm AB Living   1 0 0 0 0 0   SAB TAB Ectopic Multiple Live Births   0 0 0 0        Social History: Social History   Social History  . Marital status: Single    Spouse name: N/A  . Number of children: N/A  . Years of education: N/A   Social History Main Topics  . Smoking status: Former Smoker    Packs/day: 0.25    Types: Cigarettes    Quit date: 2016  . Smokeless tobacco: Never Used  . Alcohol use 0.6 oz/week    1 Glasses of wine per week     Comment: occ, none now.  . Drug use: No  . Sexual activity: Yes    Birth control/ protection: None     Comment: INTERCOURSE AGE 4, SEXUAL PARTNERS MORE THAN 5    Other Topics Concern  . None   Social History Narrative  . None    Family History: Family History  Problem Relation  Age of Onset  . Multiple sclerosis Mother   . Diabetes Father   . Hypertension Father   . Asthma Father   . Breast cancer Maternal Grandmother   . Cancer Maternal Grandmother   . Hypertension Maternal Grandmother   . Dementia Maternal Grandfather     Allergies: Allergies  Allergen Reactions  . Eggs Or Egg-Derived Products Swelling  . Penicillins Rash    Has patient had a PCN reaction causing immediate rash, facial/tongue/throat swelling, SOB or lightheadedness with hypotension: unknown Has patient had a PCN reaction causing severe rash involving mucus membranes or skin necrosis: unknown Has patient had a PCN reaction that required hospitalization unknown Has patient had a PCN reaction occurring within the last 10 years: no If all of the above answers are "NO", then may proceed with Cephalosporin use.   . Sulfonamide Derivatives Rash    Prescriptions Prior to Admission  Medication Sig Dispense Refill Last Dose  . acetaminophen (TYLENOL) 500 MG tablet Take 500 mg by mouth every 6 (six) hours as needed for mild pain or headache.   Past Week at Unknown time  . cyclobenzaprine (FLEXERIL) 10 MG tablet Take 0.5-1 tablets (5-10 mg total) by mouth  3 (three) times daily as needed for muscle spasms. 30 tablet 0 Past Month at Unknown time  . Prenat w/o A Vit-FeFum-FePo-FA (CONCEPT OB) 130-92.4-1 MG CAPS Take 1 capsule by mouth daily. 30 capsule 11 03/27/2016 at Unknown time  . albuterol (PROVENTIL HFA;VENTOLIN HFA) 108 (90 Base) MCG/ACT inhaler Inhale 1 puff into the lungs every 6 (six) hours as needed for wheezing or shortness of breath. 1 Inhaler 0 emergency  . Elastic Bandages & Supports (COMFORT FIT MATERNITY SUPP LG) MISC 1 application by Does not apply route daily. 1 each 0   . terconazole (TERAZOL 3) 0.8 % vaginal cream Place 1 applicator vaginally at bedtime. (Patient not taking: Reported on 03/28/2016) 20 g 0 Not Taking at Unknown time     Review of Systems   All systems reviewed and  negative except as stated in HPI  Blood pressure 134/81, pulse 91, temperature 98.2 F (36.8 C), temperature source Oral, resp. rate 16, last menstrual period 06/29/2015. General appearance: alert, cooperative and no distress Lungs: clear to auscultation bilaterally Heart: regular rate and rhythm Abdomen: soft, non-tender; bowel sounds normal Extremities: No calf swelling or tenderness Presentation: cephalic Fetal monitoring: FHR 135 bpm, mild variability, accelerations present, no decelerations Uterine activity: no contractions Dilation: 1.5 Effacement (%): 80 Station: -2, -3 Exam by:: jolynn   Prenatal labs: ABO, Rh: O/POS/-- (08/17 1100) Antibody: NEG (08/17 1100) Rubella: Immune RPR: NON REAC (12/20 1044)  HBsAg: NEGATIVE (08/17 1100)  HIV: NONREACTIVE (12/20 1044)  GBS: Positive 1 hr Glucola: Negative Genetic screening:  Unable to measure NT, nasal bone nml Anatomy US: Normal  Prenatal Transfer Tool  Maternal Diabetes: No Genetic Screening: Normal Maternal Ultrasounds/Referrals: Normal Fetal Ultrasounds or other Referrals:  None Maternal Substance Abuse:  Yes:  Type: Marijuana (09/21/2016) Significant Maternal Medications:  None Significant Maternal Lab Results: None  No results found for this or any previous visit (from the past 24 hour(s)).  Patient Active Problem List   Diagnosis Date Noted  . Group B Streptococcus carrier, +RV culture, currently pregnant 03/21/2016  . Former smoker 01/23/2016  . Supervision of normal pregnancy, antepartum 09/22/2015  . Migraine 11/21/2011  . Back pain 11/21/2011  . 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 Left breast mass 11/21/2011  . LGSIL (low grade squamous intraepithelial dysplasia)   . 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 Obesity 04/04/2006  . Hearing loss 04/04/2006  . RHINITIS, ALLERGIC 04/04/2006  . ASTHMA, INTERMITTENT 04/04/2006    Assessment: Tamara Garza is a 26 y.o. G1P0000 at [redacted]w[redacted]d here for PROM with  mild mec. No bleeding or contractions. Will admit to BS. Has PCN allergy but no sensitivities. Initial GBS screen neg, however pos on presentation. Will need to initiate cefazolin.  #Labor:PROM #Pain: None at this point #FWB: Category I strip, reassuring #ID:  GBS+, cefazolin  #MOF: Breast #MOC:Undecided #Circ:  Yes (outpt)  Wendee Beavers, DO, PGY-1 03/28/2016, 9:18 AM  CNM attestation:  I have seen and examined this patient; I agree with above documentation in the resident's note.   Terrencia Engel is a 26 y.o. G1P0000 reporting SROM +FM, VB, contractions, vaginal discharge.  PE: BP (!) 147/85   Pulse 86   Temp 98.2 F (36.8 C) (Oral)   Resp 18   Ht 5\' 5"  (1.651 m)   Wt 268 lb (121.6 kg)   LMP 06/29/2015   SpO2 100%   BMI 44.60 kg/m  Gen: calm comfortable, NAD Resp: normal effort, no distress Abd: gravid  ROS, labs, PMH reviewed NST Category I  Plan: Admit to labor and delivery Pain management as needed Augmentation as needed  Tawnya Crook, CNM 11:48 AM

## 2016-03-28 NOTE — Progress Notes (Signed)
Patient ID: Tamara Garza, female   DOB: Aug 29, 1990, 26 y.o.   MRN: [id number]  Sitting on couch, breathing w/ ctx; states they are less intense than a few hours ago  118/85, 95, 98.7 FHR 140s, some 10x10 accels, frequent mi variables Ctx irreg 4-7 mins, 40-50sec Cx deferred  IUP@term  PROM x 17hrs GBS pos  Discussed w/ pt desired plan of care; is not interested in cervical foley; requests cx exam a little later  Cam Hai CNM 03/28/2016 10:55 PM

## 2016-03-28 NOTE — Progress Notes (Signed)
Tamara Garza is a 26 y.o. G1P0000 at [redacted]w[redacted]d by LMP admitted for PROM  Subjective:  No complaints at this time. Feeling contractions more. Does not want anything for pain.  Objective: BP 140/79   Pulse 74   Temp 98.6 F (37 C) (Oral)   Resp 18   Ht 5\' 5"  (1.651 m)   Wt 268 lb (121.6 kg)   LMP 06/29/2015   SpO2 100%   BMI 44.60 kg/m  No intake/output data recorded. No intake/output data recorded.  FHT:  FHR: 135 bpm, variability: moderate,  accelerations:  Present,  decelerations:  Present variable  UC:   irregular, every 10+ minutes SVE:   Dilation: 1.5 Effacement (%): 80 Station: -2, -3 Exam by:: jolynn  Labs: Lab Results  Component Value Date   WBC 8.1 03/28/2016   HGB 12.7 03/28/2016   HCT 36.7 03/28/2016   MCV 88.9 03/28/2016   PLT 195 03/28/2016    Assessment / Plan: SROM, latent phase. Patient refuses augmentation at this time.   Labor: Will continue to monitor, consider pitocin as needed Patient offered pitocin augmentation at this time. R/B discussed. She refuses augmentation at this time. Will continue to monitor.  Preeclampsia:  NA Fetal Wellbeing:  Category I Pain Control:  Labor support without medications I/D:  n/a Anticipated MOD:  NSVD  Tawnya Crook 03/28/2016, 2:04 PM

## 2016-03-29 LAB — RPR: RPR: NONREACTIVE

## 2016-03-29 MED ORDER — ZOLPIDEM TARTRATE 5 MG PO TABS: 5.0000 mg | ORAL_TABLET | Freq: Every evening | ORAL | Status: DC | PRN

## 2016-03-29 MED ORDER — MISOPROSTOL 200 MCG PO TABS
50.0000 ug | ORAL_TABLET | ORAL | Status: DC
  Administered 2016-03-29: 50 ug via ORAL
  Filled 2016-03-29 (×2): qty 0.5

## 2016-03-29 MED ORDER — TERBUTALINE SULFATE 1 MG/ML IJ SOLN
0.2500 mg | Freq: Once | INTRAMUSCULAR | Status: DC | PRN
  Filled 2016-03-29: qty 1

## 2016-03-29 NOTE — Progress Notes (Addendum)
Cx exam 3 hours ago: 5.5/80/0 per RN.  Pt afraid of pain of labor.  Not in labor now.  Risk of infection discussed again. FHR Cat 1, mild ctx q 3-5 minutes. Recommended IOL w/pitocin, but pt agrees to cytotec (oral) only.  Vitals:   03/29/16 1902 03/29/16 1930  BP: 126/81 (!) 130/93  Pulse: 76 83  Resp: 16   Temp:  98.9 F (37.2 C)

## 2016-03-29 NOTE — Progress Notes (Signed)
Pt up for shower.  Pt reminded that she can only be off monitor for 30 minutes.  Pt verbalized undersstanding

## 2016-03-29 NOTE — Progress Notes (Signed)
Telemetry monitors reapplied

## 2016-03-29 NOTE — Progress Notes (Signed)
Roney Marion CNM instructed pt to "nipple stim" for 15 mins and rest for 15 min.Pt verbalized understanding  Breast pump set up and given to pt.

## 2016-03-29 NOTE — Progress Notes (Signed)
Pt wanted to try tele monitors.  Externals removed and tele's applied.  Pt up in chair.  Tele toco not charged and not tracing.  Externals reapplied and pt remains in chair

## 2016-03-29 NOTE — Progress Notes (Signed)
Telemetry monitors in place

## 2016-03-29 NOTE — Telephone Encounter (Signed)
Called pt to discuss the order for maternity support belt. She stated that she is in labor now and will not be needing the belt. I wished her well and completed the call.

## 2016-03-29 NOTE — Progress Notes (Signed)
  Subjective: Pt reports that contractions have decreased; declines cervical exam at this time.  Desires to do nipple stimulation with breast pump 15 min on/15 min off; then when contractions starts consent to cervical exam.    Objective: BP 132/84   Pulse 86   Temp 98.6 F (37 C) (Oral)   Resp 16   Ht 5\' 5"  (1.651 m)   Wt 268 lb (121.6 kg)   LMP 06/29/2015   SpO2 100%   BMI 44.60 kg/m  No intake/output data recorded. No intake/output data recorded.  FHT:  FHR: 135 bpm, variability: moderate,  accelerations:  Present,  decelerations:  Present early decel x 2 UC:   irregular SVE:   Dilation: 5 Effacement (%): 70 Station: -1 Exam by:: amwalker,rn  Labs: Lab Results  Component Value Date   WBC 8.1 03/28/2016   HGB 12.7 03/28/2016   HCT 36.7 03/28/2016   MCV 88.9 03/28/2016   PLT 195 03/28/2016    Assessment / Plan: 26 y.o. G1P0000 at [redacted]w[redacted]d IUP PROM x 36 hours - afebrile Category I FHR Tracing  Labor: Irregular Contractions Preeclampsia:  n/a Fetal Wellbeing:  Category I Pain Control:  Labor support without medications I/D:  GBS pos Anticipated MOD:   Reviewed the option to give pitocin low dose starting at 1 mu/min and advancing slowly to help facilitate contractions; pt plans nipple stimulation first, and then will consent to cervical check and pitocin  Rochele Pages 03/29/2016, 5:08 PM

## 2016-03-29 NOTE — Progress Notes (Signed)
Sherma Vanmetre is a 26 y.o. G1P0000 at [redacted]w[redacted]d admitted for rupture of membranes  Subjective: Patient was sleeping, states her contractions spaced out. She is now comfortable, desires natural labor with no interventions.   Objective: BP 108/67   Pulse 78   Temp 98.5 F (36.9 C) (Oral)   Resp 15   Ht 5\' 5"  (1.651 m)   Wt 121.6 kg (268 lb)   LMP 06/29/2015   SpO2 100%   BMI 44.60 kg/m  No intake/output data recorded. No intake/output data recorded.  FHT:  FHR: 140 bpm, variability: moderate,  accelerations:  Present,  decelerations:  Present occasional variable decels UC:   irregular,  SVE: no new exam   Dilation: 5 Effacement (%): 70 Station: -1 Exam by:: amwalker,rn  Labs: Lab Results  Component Value Date   WBC 8.1 03/28/2016   HGB 12.7 03/28/2016   HCT 36.7 03/28/2016   MCV 88.9 03/28/2016   PLT 195 03/28/2016    Assessment / Plan: SROM, clear fluids  Encouraged walking and position changes to see if contractions return.   Labor: Progressing normally Preeclampsia:  no signs or symptoms of toxicity Fetal Wellbeing:  Category II Pain Control:  Labor support without medications I/D:  n/a Anticipated MOD:  NSVD  Baird Kay 03/29/2016, 1:36 PM  I examined pt and agree with documentation above and nurse midwife student plan of care. Eino Farber Kennith Gain, CNM

## 2016-03-29 NOTE — Progress Notes (Signed)
Pt in knee chest position since 1758 , ability to assess uc's difficult due to maternal position.  Pt using nitrous prn

## 2016-03-29 NOTE — Progress Notes (Signed)
Fhr not tracing from 0856 to 0907  Due to maternal position

## 2016-03-30 ENCOUNTER — Inpatient Hospital Stay (HOSPITAL_COMMUNITY): Payer: Medicaid Other | Admitting: Anesthesiology

## 2016-03-30 ENCOUNTER — Encounter (HOSPITAL_COMMUNITY): Payer: Self-pay | Admitting: *Deleted

## 2016-03-30 DIAGNOSIS — O99824 Streptococcus B carrier state complicating childbirth: Secondary | ICD-10-CM

## 2016-03-30 MED ORDER — LIDOCAINE HCL (PF) 1 % IJ SOLN
INTRAMUSCULAR | Status: DC | PRN
  Administered 2016-03-30: 5 mL
  Administered 2016-03-30: 5 mL via EPIDURAL

## 2016-03-30 MED ORDER — LACTATED RINGERS IV SOLN
500.0000 mL | Freq: Once | INTRAVENOUS | Status: AC
  Administered 2016-03-30: 500 mL via INTRAVENOUS

## 2016-03-30 MED ORDER — FENTANYL 2.5 MCG/ML BUPIVACAINE 1/10 % EPIDURAL INFUSION (WH - ANES)
14.0000 mL/h | INTRAMUSCULAR | Status: DC | PRN
  Administered 2016-03-30 (×3): 14 mL/h via EPIDURAL
  Filled 2016-03-30 (×2): qty 100

## 2016-03-30 MED ORDER — EPHEDRINE 5 MG/ML INJ
10.0000 mg | INTRAVENOUS | Status: DC | PRN
  Filled 2016-03-30: qty 4

## 2016-03-30 MED ORDER — TETANUS-DIPHTH-ACELL PERTUSSIS 5-2.5-18.5 LF-MCG/0.5 IM SUSP: 0.5000 mL | Freq: Once | INTRAMUSCULAR | Status: DC

## 2016-03-30 MED ORDER — PHENYLEPHRINE 40 MCG/ML (10ML) SYRINGE FOR IV PUSH (FOR BLOOD PRESSURE SUPPORT)
PREFILLED_SYRINGE | INTRAVENOUS | Status: AC
  Filled 2016-03-30: qty 20

## 2016-03-30 MED ORDER — DIBUCAINE 1 % RE OINT: 1.0000 "application " | TOPICAL_OINTMENT | RECTAL | Status: DC | PRN

## 2016-03-30 MED ORDER — SENNOSIDES-DOCUSATE SODIUM 8.6-50 MG PO TABS
2.0000 | ORAL_TABLET | ORAL | Status: DC
  Administered 2016-03-31: 2 via ORAL
  Filled 2016-03-30 (×2): qty 2

## 2016-03-30 MED ORDER — FENTANYL 2.5 MCG/ML BUPIVACAINE 1/10 % EPIDURAL INFUSION (WH - ANES)
INTRAMUSCULAR | Status: AC
  Filled 2016-03-30: qty 100

## 2016-03-30 MED ORDER — PRENATAL MULTIVITAMIN CH
1.0000 | ORAL_TABLET | Freq: Every day | ORAL | Status: DC
  Administered 2016-03-31 – 2016-04-01 (×2): 1 via ORAL
  Filled 2016-03-30 (×2): qty 1

## 2016-03-30 MED ORDER — OXYTOCIN 40 UNITS IN LACTATED RINGERS INFUSION - SIMPLE MED
1.0000 m[IU]/min | INTRAVENOUS | Status: DC
  Administered 2016-03-30: 7 m[IU]/min via INTRAVENOUS

## 2016-03-30 MED ORDER — DIPHENHYDRAMINE HCL 50 MG/ML IJ SOLN: 12.5000 mg | INTRAMUSCULAR | Status: DC | PRN

## 2016-03-30 MED ORDER — DIPHENHYDRAMINE HCL 25 MG PO CAPS: 25.0000 mg | ORAL_CAPSULE | Freq: Four times a day (QID) | ORAL | Status: DC | PRN

## 2016-03-30 MED ORDER — SIMETHICONE 80 MG PO CHEW: 80.0000 mg | CHEWABLE_TABLET | ORAL | Status: DC | PRN

## 2016-03-30 MED ORDER — LACTATED RINGERS IV SOLN
INTRAVENOUS | Status: DC
  Administered 2016-03-30: 11:00:00 via INTRAUTERINE
  Administered 2016-03-30: 100 mL/h via INTRAUTERINE

## 2016-03-30 MED ORDER — ZOLPIDEM TARTRATE 5 MG PO TABS: 5.0000 mg | ORAL_TABLET | Freq: Every evening | ORAL | Status: DC | PRN

## 2016-03-30 MED ORDER — BENZOCAINE-MENTHOL 20-0.5 % EX AERO
1.0000 "application " | INHALATION_SPRAY | CUTANEOUS | Status: DC | PRN
  Administered 2016-03-30: 1 via TOPICAL
  Filled 2016-03-30: qty 56

## 2016-03-30 MED ORDER — PHENYLEPHRINE 40 MCG/ML (10ML) SYRINGE FOR IV PUSH (FOR BLOOD PRESSURE SUPPORT)
80.0000 ug | PREFILLED_SYRINGE | INTRAVENOUS | Status: DC | PRN
  Filled 2016-03-30: qty 5

## 2016-03-30 MED ORDER — FENTANYL CITRATE (PF) 100 MCG/2ML IJ SOLN
100.0000 ug | INTRAMUSCULAR | Status: DC | PRN
  Administered 2016-03-30: 100 ug via INTRAVENOUS

## 2016-03-30 MED ORDER — WITCH HAZEL-GLYCERIN EX PADS
1.0000 | MEDICATED_PAD | CUTANEOUS | Status: DC | PRN
Start: 2016-03-30 — End: 2016-04-01

## 2016-03-30 MED ORDER — ACETAMINOPHEN 325 MG PO TABS
650.0000 mg | ORAL_TABLET | ORAL | Status: DC | PRN
  Administered 2016-03-31 (×2): 650 mg via ORAL
  Filled 2016-03-30 (×2): qty 2

## 2016-03-30 MED ORDER — ONDANSETRON HCL 4 MG PO TABS: 4.0000 mg | ORAL_TABLET | ORAL | Status: DC | PRN

## 2016-03-30 MED ORDER — ONDANSETRON HCL 4 MG/2ML IJ SOLN: 4.0000 mg | INTRAMUSCULAR | Status: DC | PRN

## 2016-03-30 MED ORDER — FENTANYL CITRATE (PF) 100 MCG/2ML IJ SOLN
INTRAMUSCULAR | Status: AC
  Filled 2016-03-30: qty 2

## 2016-03-30 MED ORDER — TERBUTALINE SULFATE 1 MG/ML IJ SOLN
0.2500 mg | Freq: Once | INTRAMUSCULAR | Status: DC | PRN
  Filled 2016-03-30: qty 1

## 2016-03-30 MED ORDER — COCONUT OIL OIL: 1.0000 "application " | TOPICAL_OIL | Status: DC | PRN

## 2016-03-30 MED ORDER — IBUPROFEN 600 MG PO TABS
600.0000 mg | ORAL_TABLET | Freq: Four times a day (QID) | ORAL | Status: DC
  Administered 2016-03-30 – 2016-04-01 (×8): 600 mg via ORAL
  Filled 2016-03-30 (×8): qty 1

## 2016-03-30 MED ORDER — OXYTOCIN 40 UNITS IN LACTATED RINGERS INFUSION - SIMPLE MED
1.0000 m[IU]/min | INTRAVENOUS | Status: DC
  Administered 2016-03-30: 1 m[IU]/min via INTRAVENOUS

## 2016-03-30 NOTE — Progress Notes (Signed)
L&D Note  03/30/2016 - 3:24 PM  26 y.o. G1P0000 [redacted]w[redacted]d. Pregnancy complicated by BMI 44, GBS pos, MI asthma (no meds), h/o migraines,   [name] is admitted for premature ROM   Subjective:  Feels UCs but not painful.  Objective:   Vitals:   03/30/16 1400 03/30/16 1430 03/30/16 1444 03/30/16 1504  BP: 111/88 119/71 115/79   Pulse: 93 93 87   Resp: 17 18 18 18   Temp:    98.1 F (36.7 C)  TempSrc:      SpO2:      Weight:      Height:        Current Vital Signs 24h Vital Sign Ranges  T 98.1 F (36.7 C) Temp  Avg: 98.4 F (36.9 C)  Min: 97.9 F (36.6 C)  Max: 98.9 F (37.2 C)  BP 115/79 BP  Min: 105/84  Max: 147/73  HR 87 Pulse  Avg: 99  Min: 76  Max: 124  RR 18 Resp  Avg: 17.8  Min: 16  Max: 20  SaO2 98 % Not Delivered SpO2  Avg: 98.5 %  Min: 98 %  Max: 99 %       24 Hour I/O Current Shift I/O  Time Ins Outs No intake/output data recorded. 02/23 0701 - 02/23 1900 In: -  Out: 450 [Urine:450]   FHR: 135 baseline, +rare accels, occasional early decels, mod var Toco: q3-22m Gen: NAD SVE: 8/100/0 per dr schenk iupc re inserted  Labs:   Recent Labs Lab 03/28/16 1005  WBC 8.1  HGB 12.7  HCT 36.7  PLT 195   O POS  Medications Current Facility-Administered Medications  Medication Dose Route Frequency Provider Last Rate Last Dose  . acetaminophen (TYLENOL) tablet 650 mg  650 mg Oral Q4H PRN Elmon Else McMullen, DO      . albuterol (PROVENTIL) (2.5 MG/3ML) 0.083% nebulizer solution 3 mL  3 mL Inhalation Q6H PRN Wendee Beavers, DO      . ceFAZolin (ANCEF) IVPB 1 g/50 mL premix  1 g Intravenous Q8H Ugonna A Anyanwu, MD   1 g at 03/30/16 1205  . diphenhydrAMINE (BENADRYL) injection 12.5 mg  12.5 mg Intravenous Q15 min PRN Heather Roberts, MD      . ePHEDrine injection 10 mg  10 mg Intravenous PRN Heather Roberts, MD      . ePHEDrine injection 10 mg  10 mg Intravenous PRN Heather Roberts, MD      . fentaNYL (SUBLIMAZE) injection 100 mcg  100 mcg Intravenous Q1H PRN  Jacklyn Shell, CNM   100 mcg at 03/30/16 0059  . fentaNYL 2.5 mcg/ml w/bupivacaine 0.1% in NS epidural infusion (WH-ANES)  14 mL/hr Epidural Continuous PRN Heather Roberts, MD 14 mL/hr at 03/30/16 0911 14 mL/hr at 03/30/16 0911  . lactated ringers infusion 500-1,000 mL  500-1,000 mL Intravenous PRN Wendee Beavers, DO 1,000 mL/hr at 03/29/16 2314 500 mL at 03/29/16 2314  . lactated ringers infusion   Intravenous Continuous Wendee Beavers, DO 125 mL/hr at 03/30/16 1153    . lactated ringers infusion   Intrauterine Continuous Jacklyn Shell, CNM 100 mL/hr at 03/30/16 1121    . lidocaine (PF) (XYLOCAINE) 1 % injection 30 mL  30 mL Subcutaneous PRN Wendee Beavers, DO      . misoprostol (CYTOTEC) tablet 50 mcg  50 mcg Oral Q4H Arabella Merles, CNM   50 mcg at 03/29/16 2208  . ondansetron (ZOFRAN) injection 4 mg  4 mg  Intravenous Q6H PRN Wendee Beavers, DO   4 mg at 03/30/16 1044  . oxyCODONE-acetaminophen (PERCOCET/ROXICET) 5-325 MG per tablet 1 tablet  1 tablet Oral Q4H PRN Wendee Beavers, DO      . oxyCODONE-acetaminophen (PERCOCET/ROXICET) 5-325 MG per tablet 2 tablet  2 tablet Oral Q4H PRN Wendee Beavers, DO      . oxytocin (PITOCIN) IV BOLUS FROM BAG  500 mL Intravenous Once Wendee Beavers, DO      . oxytocin (PITOCIN) IV infusion 40 units in LR 1000 mL - Premix  2.5 Units/hr Intravenous Continuous Wendee Beavers, DO      . oxytocin (PITOCIN) IV infusion 40 units in LR 1000 mL - Premix  1-40 milli-units/min Intravenous Titrated Lorne Skeens, MD 7.5 mL/hr at 03/30/16 1439 5 milli-units/min at 03/30/16 1439  . PHENYLephrine 40 mcg/ml in normal saline Adult IV Push Syringe  80 mcg Intravenous PRN Heather Roberts, MD      . PHENYLephrine 40 mcg/ml in normal saline Adult IV Push Syringe  80 mcg Intravenous PRN Heather Roberts, MD      . sodium citrate-citric acid (ORACIT) solution 30 mL  30 mL Oral Q2H PRN Wendee Beavers, DO      . sodium phosphate (FLEET)  7-19 GM/118ML enema 1 enema  1 enema Rectal PRN Wendee Beavers, DO      . terbutaline (BRETHINE) injection 0.25 mg  0.25 mg Subcutaneous Once PRN Armando Reichert, CNM      . terbutaline (BRETHINE) injection 0.25 mg  0.25 mg Subcutaneous Once PRN Arabella Merles, CNM      . terbutaline (BRETHINE) injection 0.25 mg  0.25 mg Subcutaneous Once PRN Lorne Skeens, MD      . zolpidem [facility]) tablet 5 mg  5 mg Oral QHS PRN Arabella Merles, CNM       Facility-Administered Medications Ordered in Other Encounters  Medication Dose Route Frequency Provider Last Rate Last Dose  . lidocaine (PF) (XYLOCAINE) 1 % injection    Anesthesia Intra-op Heather Roberts, MD   5 mL at 03/30/16 0207    Assessment & Plan:  Pt stable *IUP: fetal status reassuring, category I tracing *PROM: d/w pt re: prolonged labor course. Patient has been reluctant to allow for aggressive augmentation once premature ROM diagnosed. Patient 8cm, by Dr. Genevie Ann, at 0900 today and same as SVE as now and pt allowed for commencing of pitocin but at a very low and slow dose. Pt currently on 39mU. Looking back at her cx checks, she's probably been 7-8 since about 0300 today. I told her that given her lack of cx change, even if the pitocin isn't adequate that I recommend  c-section given arrest of dilation and risk of IAI and PPH. She declines this, so I told her that I recommend that she allow reinsertion of the IUPC and allow [place] to go up on the pitocin more aggressively and per protocol and recheck SVE at 1930, unless she or the fetal shows a reason for an earlier check. If no change by then, then she was counseled to please reconsider primary c-section. She is amenable to this plan. Will let Dr. Emelda Fear know at signout. Would recommend azithro 500mg  IV x 1 pre op in addition to ancef given how long her labor has been and she has been ruptured.  *GBS: pos. S/p ancef x 2 *Analgesia: epidural working well  Cornelia Copa.  MD Attending [facility] [employer] (Faculty  Practice)

## 2016-03-30 NOTE — Progress Notes (Signed)
Pt refused to turn positions in bed.  Educated about risks and benefits of being not turning positions.

## 2016-03-30 NOTE — Progress Notes (Signed)
Vitals:   03/30/16 0331 03/30/16 0335  BP: (!) 115/55   Pulse: (!) 101 97  Resp:    Temp:     Ctx q 3-5 minutes, variable decels w/each ctx despite position changes and IVF bolus. IUPC placed and amnioinfusion started.

## 2016-03-30 NOTE — Progress Notes (Signed)
Patient seen. She has been ruptured for greater than 48 hours. At this time continue GBS prophalyxis. Patient has IUPC in place and has not been adequate. She has been reistant to pitocin, but after discussion of risks associated with infection including severe bleeding and possible NICU admission for the baby, patient is agreeable to start 1x1 pitocin. Continue to Monitor closely for signs of Tripple I including fevere or fetal tachycardia.

## 2016-03-30 NOTE — Progress Notes (Signed)
This note also relates to the following rows which could not be included: BP - Cannot attach notes to unvalidated device data Pulse Rate - Cannot attach notes to unvalidated device data  Epidural line pulled ,blue tip intact

## 2016-03-30 NOTE — Lactation Note (Signed)
This note was copied from a baby's chart. Lactation Consultation Note  Patient Name: Tamara Garza [id number] Date: 03/30/2016 Reason for consult: Initial assessment   Initial assessment with first time mom of < 1 hour old infant in [name]. MOB reports she would like to BF for 1 year. She attended BF classes at [facility].  Infant STS with mom and cueing to feed. Mom with large compressible breasts and areola with short shaft everted nipples. Attempted to hand express on the right breast, no colostrum was noted. Mom reports + breast changes with pregnancy. Assisted mom in latching infant to right breast in the cross cradle hold. Infant had difficulty with maintaining latch. Placed infant in football hold to right breast. Infant able to maintain latch better. He fed for 10 minutes and then became gaggy. Left STS with mom.   Mom reports she has carpel tunnel and feels like it would be difficult to hold infant on the left breast. Enc mom to feed infant STS 8-12 x in 24 hours at first feeding cues. Enc mom to call out for assistance as needed. Enc mom to use pillow and head support with feeding. Discussed colostrum, milk coming to volume, hand expression, infant stomach size, head and pillow support, BF basics reviewed. Feeding log given with instructions for use.   BF Resources Handout and LC Brochure given, mom informed of IP/OP Services, Bf Support Groups and LC phone #. Mom is a [facility] Client and is aware to call and make appt post d/c. Mom with numerous questions that were answered.    Maternal Data Formula Feeding for Exclusion: No Has patient been taught Hand Expression?: Yes Does the patient have breastfeeding experience prior to this delivery?: No  Feeding Feeding Type: Breast Fed Length of feed: 10 min  LATCH Score/Interventions Latch: Grasps breast easily, tongue down, lips flanged, rhythmical sucking. Intervention(s): Adjust position;Assist with latch;Breast massage;Breast  compression  Audible Swallowing: Spontaneous and intermittent Intervention(s): Hand expression;Skin to skin  Type of Nipple: Everted at rest and after stimulation  Comfort (Breast/Nipple): Soft / non-tender     Hold (Positioning): Assistance needed to correctly position infant at breast and maintain latch. Intervention(s): Breastfeeding basics reviewed;Position options;Support Pillows;Skin to skin  LATCH Score: 9  Lactation Tools Discussed/Used WIC Program: Yes   Consult Status Consult Status: Follow-up Date: 03/31/16 Follow-up type: In-patient    [name] 03/30/2016, 5:51 PM

## 2016-03-30 NOTE — Progress Notes (Signed)
This note also relates to the following rows which could not be included: BP - Cannot attach notes to unvalidated device data Pulse Rate - Cannot attach notes to unvalidated device data  I&o cath

## 2016-03-30 NOTE — Anesthesia Preprocedure Evaluation (Signed)
Anesthesia Evaluation  Patient identified by MRN, date of birth, ID band Patient awake    Reviewed: Allergy & Precautions, NPO status , Patient's Chart, lab work & pertinent test results  Airway Mallampati: II  TM Distance: >3 FB Neck ROM: Full    Dental no notable dental hx. (+) Dental Advisory Given   Pulmonary asthma , former smoker,    Pulmonary exam normal        Cardiovascular negative cardio ROS Normal cardiovascular exam     Neuro/Psych  Headaches, negative psych ROS   GI/Hepatic negative GI ROS, Neg liver ROS,   Endo/Other  Morbid obesity  Renal/GU negative Renal ROS  negative genitourinary   Musculoskeletal negative musculoskeletal ROS (+)   Abdominal   Peds negative pediatric ROS (+)  Hematology negative hematology ROS (+)   Anesthesia Other Findings   Reproductive/Obstetrics (+) Pregnancy                             Anesthesia Physical Anesthesia Plan  ASA: III  Anesthesia Plan: Epidural   Post-op Pain Management:    Induction:   Airway Management Planned: Natural Airway  Additional Equipment:   Intra-op Plan:   Post-operative Plan:   Informed Consent: I have reviewed the patients History and Physical, chart, labs and discussed the procedure including the risks, benefits and alternatives for the proposed anesthesia with the patient or authorized representative who has indicated his/her understanding and acceptance.   Dental advisory given  Plan Discussed with: Anesthesiologist  Anesthesia Plan Comments:         Anesthesia Quick Evaluation

## 2016-03-31 NOTE — Progress Notes (Signed)
Post Partum Day 1 Subjective: no complaints, up ad lib, voiding, tolerating PO and + flatus  Objective: Blood pressure 121/75, pulse 82, temperature 97.7 F (36.5 C), temperature source Oral, resp. rate 16, height 5\' 5"  (1.651 m), weight 268 lb (121.6 kg), last menstrual period 06/29/2015, SpO2 98 %, unknown if currently breastfeeding.  Physical Exam:  General: alert, cooperative and no distress Lochia: appropriate Uterine Fundus: firm Incision: healing well DVT Evaluation: No evidence of DVT seen on physical exam.   Recent Labs  03/28/16 1005  HGB 12.7  HCT 36.7    Assessment/Plan: Plan for discharge tomorrow   LOS: 3 days   Wendee Beavers, DO, PGY-1 03/31/2016, 8:47 AM   I have seen and examined this patient and I agree with the above. Breastfeeding; unknown contraception. Cam Hai CNM 9:04 AM 03/31/2016

## 2016-03-31 NOTE — Anesthesia Postprocedure Evaluation (Signed)
Anesthesia Post Note  Patient: Tamara Garza  Procedure(s) Performed: * No procedures listed *  Patient location during evaluation: Mother Baby Anesthesia Type: Epidural Level of consciousness: awake and alert and oriented Pain management: pain level controlled Vital Signs Assessment: post-procedure vital signs reviewed and stable Respiratory status: spontaneous breathing and nonlabored ventilation Cardiovascular status: stable Postop Assessment: no headache, patient able to bend at knees, no backache, no signs of nausea or vomiting, epidural receding and adequate PO intake Anesthetic complications: no        Last Vitals:  Vitals:   03/31/16 0117 03/31/16 0657  BP: 114/71 121/75  Pulse: 95 82  Resp: 14 16  Temp: 36.6 C 36.5 C    Last Pain:  Vitals:   03/31/16 0657  TempSrc: Oral  PainSc:    Pain Goal: Patients Stated Pain Goal: 6 (03/30/16 0728)               Laban Emperor

## 2016-03-31 NOTE — Clinical Social Work Maternal (Signed)
CLINICAL SOCIAL WORK MATERNAL/CHILD NOTE  Patient Details  Name: Tamara Garza MRN: [id number] Date of Birth: 1990-10-06  Date:  03/31/2016  Clinical Social Worker Initiating Note:  Laurey Arrow Date/ Time Initiated:  03/31/16/1045     Child's Name:  Tamara Garza.    Legal Guardian:  Mother (FOB is Tamara Garza Sr. )   Need for Interpreter:  None   Date of Referral:  03/31/16     Reason for Referral:  Behavioral Health Issues, including SI , Current Substance Use/Substance Use During Pregnancy  (hx of marijuana use in pregnancy. )   Referral Source:  Central Nursery   Address:  [address] [place] [address]  Phone number:  [phone]   Household Members:  Self, Parents, Significant Other   Natural Supports (not living in the home):  Immediate Family   Professional Supports: None   Employment: Full-time   Type of Work: Press photographer Rep   Education:  Chiropractor Resources:  [employer]   Other Resources:  Physicist, medical , [place] Considerations Which May Impact Care:  Per [employer] Sheet, MOB is Non-Denominational.  Strengths:  Ability to meet basic needs , Home prepared for child    Risk Factors/Current Problems:  Substance Use , Mental Health Concerns    Cognitive State:  Able to Concentrate , Alert , Linear Thinking , Insightful    Mood/Affect:  Bright , Happy , Interested , Relaxed    CSW Assessment: CSW met with MOB to complete an assessment for a consult for substance abuse hx. When CSW arrived, MOB was in bed holding infant and FOB (Tamara Garza)  was sitting on the couch. MOB gave CSW permission to complete the assessment while FOB was present. MOB was polite and receptive to meeting with CSW. CSW inquired about MOB's substance abuse hx, and MOB acknowledged the use of marijuana during pregnancy.  MOB reported MOB's last use was December 2017. CSW made MOB aware of the hospital's policy  and procedure as it relates to substance use. CSW made MOB aware of the two screenings for the infant. 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  CSW thanked MOB for being honest, and informed MOB that CSW will monitor the infant's UDS and CDS and will make a report to [facility] CPS if the results are positive without an explanation. MOB was understanding and did not have any questions or concerns. CSW offered resources and referrals for SA treatment and MOB declined. CSW inquired about MOB's MH hx and MOB denied anxiety and depression. MOB attributed MOB's MH signs and symptoms during pregnancy to hormones.  CSW educated MOB about PPD and informed MOB of possible supports and interventions to decrease PPD.  CSW also encouraged MOB to seek medical attention if needed for increased signs and symptoms for PPD. MOB communicated that she is familiar with behavior health resources and she is not afraid to ask or seek help. CSW reviewed safe sleep and SIDS and MOB asked and responded appropriately.  CSW thanked MOB for meeting CSW.  CSW provided MOB with CSW contact information and was encouraged to contact CSW if any questions or concerns arise  CSW Plan/Description:  Information/Referral to [employer] , No Further Intervention Required/No Barriers to Discharge, Patient/Family Education  (CSW will monitor infant's UDS and CDS an will make a report if warranted. )  Laurey Arrow, MSW, LCSW Clinical Social Work [phone]    Dimple Nanas, LCSW 03/31/2016, 10:49 AM

## 2016-04-01 NOTE — Lactation Note (Signed)
This note was copied from a baby's chart. Lactation Consultation Note  Patient Name: Tamara Garza [id number] Date: 04/01/2016   Follow up visit made prior to discharge.  Mom just finished pumping with manual pump and obtained about 20 mls.  Mom discouraged she didn't get more.  Reassured that this is normal and milk volume increases between day 3-5.  Baby has been cluster feeding.  Lactation outpatient services and support reviewed and encouraged.  Maternal Data    Feeding Feeding Type: Breast Milk with Formula added Nipple Type: Slow - flow Length of feed: 15 min  LATCH Score/Interventions                      Lactation Tools Discussed/Used     Consult Status      Huston Foley 04/01/2016, 1:28 PM

## 2016-04-01 NOTE — Discharge Summary (Signed)
OB Discharge Summary     Patient Name: Tamara Garza DOB: 1991-01-17 MRN: [id number]  Date of admission: 03/28/2016 Delivering MD: Wendee Beavers   Date of discharge: 04/01/2016  Admitting diagnosis: WATER BROKE Intrauterine pregnancy: [redacted]w[redacted]d     Secondary diagnosis:  Active Problems:   PROM (premature rupture of membranes)  Additional problems: None     Discharge diagnosis: Term Pregnancy Delivered                                                                                                Post partum procedures:None  Augmentation: Pitocin  Complications: None  Hospital course:  Onset of Labor With Vaginal Delivery     26 y.o. yo G1P1001 at [redacted]w[redacted]d was admitted in Latent Labor on 03/28/2016. Patient had an uncomplicated labor course as follows:  Membrane Rupture Time/Date: 5:00 AM ,03/28/2016   Intrapartum Procedures: Episiotomy: None [1]                                         Lacerations:  1st degree [2];Perineal [11]  Patient had a delivery of a Viable infant. 03/30/2016  Information for the patient's newborn:  Tamara, Garza [[id number]]  Delivery Method: Vaginal, Spontaneous Delivery (Filed from Delivery Summary)    Pateint had an uncomplicated postpartum course.  She is ambulating, tolerating a regular diet, passing flatus, and urinating well. Patient is discharged home in stable condition on 04/01/16.   Physical exam  Vitals:   03/31/16 0657 03/31/16 1009 03/31/16 1800 04/01/16 0550  BP: 121/75 112/72 119/79 124/76  Pulse: 82 95 100 86  Resp: 16 20 19 18   Temp: 97.7 F (36.5 C) 98.6 F (37 C) 98.1 F (36.7 C) 98.4 F (36.9 C)  TempSrc: Oral Oral    SpO2:  99%    Weight:      Height:       General: alert, cooperative and no distress Lochia: appropriate Uterine Fundus: firm Incision: N/A DVT Evaluation: No evidence of DVT seen on physical exam. Negative Homan's sign. No cords or calf tenderness. No significant calf/ankle edema. Labs: Lab  Results  Component Value Date   WBC 8.1 03/28/2016   HGB 12.7 03/28/2016   HCT 36.7 03/28/2016   MCV 88.9 03/28/2016   PLT 195 03/28/2016   CMP Latest Ref Rng & Units 11/21/2011  Glucose 70 - 99 mg/dL 93  BUN 6 - 23 mg/dL 11  Creatinine 0.4 - 1.2 mg/dL 0.6  Sodium 952 - 841 mEq/L 138  Potassium 3.5 - 5.1 mEq/L 4.2  Chloride 96 - 112 mEq/L 105  CO2 19 - 32 mEq/L 26  Calcium 8.4 - 10.5 mg/dL 9.2  Total Protein 6.0 - 8.3 g/dL 7.3  Total Bilirubin 0.3 - 1.2 mg/dL 0.4  Alkaline Phos 39 - 117 U/L 68  AST 0 - 37 U/L 16  ALT 0 - 35 U/L 16    Discharge instruction: per After Visit Summary and "Baby and Me Booklet".  After visit meds:  Allergies  as of 04/01/2016      Reactions   Eggs Or Egg-derived Products Swelling   Penicillins Rash   Has patient had a PCN reaction causing immediate rash, facial/tongue/throat swelling, SOB or lightheadedness with hypotension: unknown Has patient had a PCN reaction causing severe rash involving mucus membranes or skin necrosis: unknown Has patient had a PCN reaction that required hospitalization unknown Has patient had a PCN reaction occurring within the last 10 years: no If all of the above answers are "NO", then may proceed with Cephalosporin use.   Sulfonamide Derivatives Rash      Medication List    STOP taking these medications   cyclobenzaprine 10 MG tablet Commonly known as:  FLEXERIL   terconazole 0.8 % vaginal cream Commonly known as:  TERAZOL 3     TAKE these medications   acetaminophen 500 MG tablet Commonly known as:  TYLENOL Take 500 mg by mouth every 6 (six) hours as needed for mild pain or headache.   albuterol 108 (90 Base) MCG/ACT inhaler Commonly known as:  PROVENTIL HFA;VENTOLIN HFA Inhale 1 puff into the lungs every 6 (six) hours as needed for wheezing or shortness of breath.   COMFORT FIT MATERNITY SUPP LG Misc 1 application by Does not apply route daily.   CONCEPT OB 130-92.4-1 MG Caps Take 1 capsule by mouth  daily.       Diet: routine diet  Activity: Advance as tolerated. Pelvic rest for 6 weeks.   Outpatient follow up:6 weeks Follow up Appt: Future Appointments Date Time Provider Department Center  05/09/2016 1:20 PM Deirdre Colin Mulders, CNM [facility]   Follow up Visit:No Follow-up on file.  Postpartum contraception: Condoms  Newborn Data: Live born female  Birth Weight: 6 lb 6 oz (2892 g) APGAR: 9, 9  Baby Feeding: Breast Disposition:home with mother   04/01/2016 Wendee Beavers, DO, PGY-1  OB Fellow attestation I have seen and examined this patient and agree with above documentation in  Dr. Valorie Roosevelt note.   Tamara Garza is a 26 y.o. G1P1001 s/p SVD.   Pain is well controlled.  Plan for birth control is condoms.  Method of Feeding: Breast  PE:  BP 124/76   Pulse 86   Temp 98.4 F (36.9 C)   Resp 18   Ht 5\' 5"  (1.651 m)   Wt 268 lb (121.6 kg)   LMP 06/29/2015   SpO2 99%   Breastfeeding? Unknown   BMI 44.60 kg/m  Fundus firm Homan's sign negative, trace edema B/l  No results for input(s): HGB, HCT in the last 72 hours.   Plan: discharge today - postpartum care discussed - f/u clinic in 6 weeks for postpartum visit   Ernestina Penna, MD 12:41 PM

## 2016-04-01 NOTE — Discharge Instructions (Signed)

## 2016-04-04 ENCOUNTER — Encounter: Payer: Self-pay | Admitting: Medical

## 2016-04-29 ENCOUNTER — Encounter (HOSPITAL_BASED_OUTPATIENT_CLINIC_OR_DEPARTMENT_OTHER): Payer: Self-pay | Admitting: Emergency Medicine

## 2016-04-29 ENCOUNTER — Emergency Department (HOSPITAL_BASED_OUTPATIENT_CLINIC_OR_DEPARTMENT_OTHER)
Admission: EM | Admit: 2016-04-29 | Discharge: 2016-04-30 | Disposition: A | Discharge status: Home / Self Care | Payer: Medicaid Other | Attending: Emergency Medicine | Admitting: Emergency Medicine

## 2016-04-29 ENCOUNTER — Emergency Department (HOSPITAL_BASED_OUTPATIENT_CLINIC_OR_DEPARTMENT_OTHER): Payer: Medicaid Other

## 2016-04-29 DIAGNOSIS — X501XXA Overexertion from prolonged static or awkward postures, initial encounter: Secondary | ICD-10-CM | POA: Diagnosis not present

## 2016-04-29 DIAGNOSIS — Z79899 Other long term (current) drug therapy: Secondary | ICD-10-CM | POA: Diagnosis not present

## 2016-04-29 DIAGNOSIS — Y929 Unspecified place or not applicable: Secondary | ICD-10-CM | POA: Insufficient documentation

## 2016-04-29 DIAGNOSIS — Z87891 Personal history of nicotine dependence: Secondary | ICD-10-CM | POA: Insufficient documentation

## 2016-04-29 DIAGNOSIS — J45909 Unspecified asthma, uncomplicated: Secondary | ICD-10-CM | POA: Diagnosis not present

## 2016-04-29 DIAGNOSIS — Y9389 Activity, other specified: Secondary | ICD-10-CM | POA: Insufficient documentation

## 2016-04-29 DIAGNOSIS — S6992XA Unspecified injury of left wrist, hand and finger(s), initial encounter: Secondary | ICD-10-CM | POA: Diagnosis present

## 2016-04-29 DIAGNOSIS — Y999 Unspecified external cause status: Secondary | ICD-10-CM | POA: Insufficient documentation

## 2016-04-29 DIAGNOSIS — M79645 Pain in left finger(s): Secondary | ICD-10-CM | POA: Diagnosis not present

## 2016-04-29 MED ORDER — IBUPROFEN 400 MG PO TABS
600.0000 mg | ORAL_TABLET | Freq: Once | ORAL | Status: AC
  Administered 2016-04-30: 600 mg via ORAL
  Filled 2016-04-29: qty 1

## 2016-04-29 NOTE — ED Triage Notes (Signed)
Patient reports "thumb has been coming out of the socket for a month and a half and today I popped it back in and felt something pop and now it hurts".

## 2016-04-29 NOTE — ED Provider Notes (Signed)
[facility] Provider Note   CSN: [id number] Arrival date & time: 04/29/16  2139  By signing my name below, I, Nelwyn Salisbury, attest that this documentation has been prepared under the direction and in the presence of non-physician practitioner, Rise Mu, PA-C. Electronically Signed: Nelwyn Salisbury, Scribe. 04/29/2016. 11:12 PM.  History   Chief Complaint Chief Complaint  Patient presents with  . Finger Injury   The history is provided by the patient. No language interpreter was used.    HPI Comments:  Tamara Garza is a 26 y.o. female who presents to the Emergency Department complaining of constant, waxing/waning left thumb pain onset 1 month. Pt states that about a month ago she dislocated her thumb and reduced the joint herself. Since then, she notes that her thumb "falls out of socket". Pt's thumb dislocated again today and when she attempted to reduce it, she felt a "pop". Her pain is exacerbated with use and palpation. She has tried tylenol at home with temporary minimal relief. Pt denies any weakness or numbness, or inability to move the joint.   Past Medical History:  Diagnosis Date  . ACNE 04/04/2006   Qualifier: Diagnosis of  By: Abundio Miu    . Asthma   . ASTHMA, INTERMITTENT 04/04/2006   Qualifier: Diagnosis of  By: Lelon Perla MD, Vickki Muff    . Gallstones 2011  . 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 Infection    UTI  . LGSIL (low grade squamous intraepithelial dysplasia)   . Migraine 11/21/2011  . RHINITIS, ALLERGIC 04/04/2006   Qualifier: Diagnosis of  By: Abundio Miu    . Vaginal Pap smear, abnormal    ok since colpo    Patient Active Problem List   Diagnosis Date Noted  . PROM (premature rupture of membranes) 03/28/2016  . Group B Streptococcus carrier, +RV culture, currently pregnant 03/21/2016  . Former smoker 01/23/2016  . Supervision of normal pregnancy, antepartum 09/22/2015  . Migraine 11/21/2011  . Back pain 11/21/2011  .  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 Left breast mass 11/21/2011  . LGSIL (low grade squamous intraepithelial dysplasia)   . 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 Obesity 04/04/2006  . Hearing loss 04/04/2006  . RHINITIS, ALLERGIC 04/04/2006  . ASTHMA, INTERMITTENT 04/04/2006    Past Surgical History:  Procedure Laterality Date  . CHOLECYSTECTOMY    . COLPOSCOPY  2009    OB History    Gravida Para Term Preterm AB Living   1 1 1  0 0 1   SAB TAB Ectopic Multiple Live Births   0 0 0 0 1       Home Medications    Prior to Admission medications   Medication Sig Start Date End Date Taking? Authorizing Provider  acetaminophen (TYLENOL) 500 MG tablet Take 500 mg by mouth every 6 (six) hours as needed for mild pain or headache.    Historical Provider, MD  albuterol (PROVENTIL HFA;VENTOLIN HFA) 108 (90 Base) MCG/ACT inhaler Inhale 1 puff into the lungs every 6 (six) hours as needed for wheezing or shortness of breath. 02/01/16   Mendon Bing, MD  Elastic Bandages & Supports (COMFORT FIT MATERNITY SUPP LG) MISC 1 application by Does not apply route daily. 03/21/16   Donette Larry, CNM  Prenat w/o A Vit-FeFum-FePo-FA (CONCEPT OB) 130-92.4-1 MG CAPS Take 1 capsule by mouth daily. 02/07/16   Hurshel Party, CNM    Family History Family History  Problem Relation Age of Onset  .  Multiple sclerosis Mother   . Diabetes Father   . Hypertension Father   . Asthma Father   . Breast cancer Maternal Grandmother   . Cancer Maternal Grandmother   . Hypertension Maternal Grandmother   . Dementia Maternal Grandfather     Social History Social History  Substance Use Topics  . Smoking status: Former Smoker    Packs/day: 0.25    Types: Cigarettes    Quit date: 2016  . Smokeless tobacco: Never Used  . Alcohol use 0.6 oz/week    1 Glasses of wine per week     Comment: occ, none now.     Allergies   Eggs or egg-derived products; Penicillins; and Sulfonamide  derivatives   Review of Systems Review of Systems  Musculoskeletal: Positive for arthralgias.  Neurological: Negative for weakness and numbness.  All other systems reviewed and are negative.    Physical Exam Updated Vital Signs BP 128/86 (BP Location: Left Arm)   Pulse 83   Temp 98.2 F (36.8 C) (Oral)   Resp 18   Ht 5\' 5"  (1.651 m)   Wt 251 lb (113.9 kg)   SpO2 100%   Breastfeeding? Yes   BMI 41.77 kg/m   Physical Exam  Constitutional: She is oriented to person, place, and time. She appears well-developed and well-nourished. No distress.  HENT:  Head: Normocephalic and atraumatic.  Eyes: Conjunctivae are normal.  Cardiovascular: Normal rate.   Radial pulses 2+   Pulmonary/Chest: Effort normal.  Abdominal: She exhibits no distension.  Musculoskeletal:  Tenderness to palpation over first [facility] joint. No erythema or edema. Full ROM of DIP and MC joint. Good abduction, adduction and opposition of thumb.   Neurological: She is alert and oriented to person, place, and time.  Sensation intact to sharp and dull.   Skin: Skin is warm and dry. Capillary refill takes less than 2 seconds.  Psychiatric: She has a normal mood and affect.  Nursing note and vitals reviewed.    ED Treatments / Results  DIAGNOSTIC STUDIES:  Oxygen Saturation is 100% on RA, normal by my interpretation.    COORDINATION OF CARE:  11:33 PM Discussed treatment plan with pt at bedside which includes referral to orthopedist and a splint for maintenance and pt agreed to plan.  Labs (all labs ordered are listed, but only abnormal results are displayed) Labs Reviewed - No data to display  EKG  EKG Interpretation None       Radiology Dg Finger Thumb Left  Result Date: 04/29/2016 CLINICAL DATA:  Acute onset of pain and limited range of motion at the left thumb. Initial encounter. EXAM: LEFT THUMB 2+V COMPARISON:  None. FINDINGS: There is no evidence of fracture or dislocation at this time. No  definite subluxation is seen. The sesamoids of the thumb are grossly unremarkable. Visualized joint spaces are grossly preserved. Mild soft tissue swelling is noted about the base of the thumb. IMPRESSION: No evidence of fracture or dislocation at this time. No definite subluxation seen. Soft tissues are not well assessed on radiograph. Electronically Signed   By: Roanna Raider M.D.   On: 04/29/2016 22:44    Procedures Procedures (including critical care time)  Medications Ordered in ED Medications  ibuprofen (ADVIL,MOTRIN) tablet 600 mg (600 mg Oral Given 04/30/16 0000)     Initial Impression / Assessment and Plan / ED Course  I have reviewed the triage vital signs and the nursing notes.  Pertinent labs & imaging results that were available during my care  of the patient were reviewed by me and considered in my medical decision making (see chart for details).     Patient X-Ray negative for obvious fracture or dislocation. Neurovascularly intact. No signs of septic joint. Pain managed in ED. Pt advised to follow up with orthopedics if symptoms persist for possibility of missed fracture diagnosis. Patient given brace while in ED, conservative therapy recommended and discussed. Patient will be dc home & is agreeable with above plan.  SPLINT APPLICATION Date/Time: 7:27 AM Authorized by: Demetrios Loll Consent: Verbal consent obtained. Risks and benefits: risks, benefits and alternatives were discussed Consent given by: patient Splint applied by: technician Location details: Left thumb  Splint type: Thumb spica Velcro  Supplies used: Velcro thumb spica splint  Post-procedure: The splinted body part was neurovascularly unchanged following the procedure. Patient tolerance: Patient tolerated the procedure well with no immediate complications.      Final Clinical Impressions(s) / ED Diagnoses   Final diagnoses:  Pain of left thumb    New Prescriptions Discharge Medication List  as of 04/29/2016 11:52 PM    I personally performed the services described in this documentation, which was scribed in my presence. The recorded information has been reviewed and is accurate.     Rise Mu, PA-C 05/02/16 1610    Geoffery Lyons, MD 05/03/16 [phone]

## 2016-04-29 NOTE — Discharge Instructions (Signed)
Please rest, ice, elevate your hand. Wear the splint for comfort. Motrin and Tylenol for pain. Follow-up with orthopedist.

## 2016-04-29 NOTE — ED Notes (Signed)
Given coffee per request, updated.

## 2016-04-29 NOTE — ED Notes (Signed)
Alert, NAD, calm, interactive, resps e/u, speaking in clear complete sentences, no dyspnea noted, skin W&D, VSS.

## 2016-05-01 ENCOUNTER — Ambulatory Visit (INDEPENDENT_AMBULATORY_CARE_PROVIDER_SITE_OTHER): Payer: Medicaid Other | Admitting: Family Medicine

## 2016-05-01 ENCOUNTER — Encounter: Payer: Self-pay | Admitting: Family Medicine

## 2016-05-01 DIAGNOSIS — M79645 Pain in left finger(s): Secondary | ICD-10-CM | POA: Diagnosis not present

## 2016-05-01 NOTE — Patient Instructions (Signed)
Wear the thumb spica brace at all times except to wash area, ice if needed. Icing 15 minutes at a time 3-4 times a day if needed. Tylenol only if needed for pain. Follow up with me in 6 weeks. Call me sooner if you're still having this pop out of place despite the brace. If this is the case we would go ahead with MRI. If you're doing well at 6 weeks we will add occupational therapy.

## 2016-05-02 DIAGNOSIS — M79645 Pain in left finger(s): Secondary | ICD-10-CM | POA: Insufficient documentation

## 2016-05-02 NOTE — Progress Notes (Signed)
PCP: No PCP Per Patient  Subjective:   HPI: Patient is a 26 y.o. female here for left thumb pain.  Patient reports she recalls this starting back in November of 2017. Believes she slept on her left hand wrong and left thumb was out of place. She pushed on base of this to put it back into place. States this has been occurring more since birth of her son in February. Has not sought care for this. Left handed. Pain level 4/10 now with thumb spica splint on, dull. Some swelling but this has improved. No skin changes, numbness.  Past Medical History:  Diagnosis Date  . ACNE 04/04/2006   Qualifier: Diagnosis of  By: Abundio Miu    . Asthma   . ASTHMA, INTERMITTENT 04/04/2006   Qualifier: Diagnosis of  By: Lelon Perla MD, Vickki Muff    . Gallstones 2011  . 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 Infection    UTI  . LGSIL (low grade squamous intraepithelial dysplasia)   . Migraine 11/21/2011  . RHINITIS, ALLERGIC 04/04/2006   Qualifier: Diagnosis of  By: Abundio Miu    . Vaginal Pap smear, abnormal    ok since colpo    Current Outpatient Prescriptions on File Prior to Visit  Medication Sig Dispense Refill  . acetaminophen (TYLENOL) 500 MG tablet Take 500 mg by mouth every 6 (six) hours as needed for mild pain or headache.    . albuterol (PROVENTIL HFA;VENTOLIN HFA) 108 (90 Base) MCG/ACT inhaler Inhale 1 puff into the lungs every 6 (six) hours as needed for wheezing or shortness of breath. 1 Inhaler 0  . Elastic Bandages & Supports (COMFORT FIT MATERNITY SUPP LG) MISC 1 application by Does not apply route daily. 1 each 0  . Prenat w/o A Vit-FeFum-FePo-FA (CONCEPT OB) 130-92.4-1 MG CAPS Take 1 capsule by mouth daily. 30 capsule 11   No current facility-administered medications on file prior to visit.     Past Surgical History:  Procedure Laterality Date  . CHOLECYSTECTOMY    . COLPOSCOPY  2009    Allergies  Allergen Reactions  . Eggs Or Egg-Derived Products Swelling  .  Penicillins Rash    Has patient had a PCN reaction causing immediate rash, facial/tongue/throat swelling, SOB or lightheadedness with hypotension: unknown Has patient had a PCN reaction causing severe rash involving mucus membranes or skin necrosis: unknown Has patient had a PCN reaction that required hospitalization unknown Has patient had a PCN reaction occurring within the last 10 years: no If all of the above answers are "NO", then may proceed with Cephalosporin use.   . Sulfonamide Derivatives Rash    Social History   Social History  . Marital status: Single    Spouse name: N/A  . Number of children: N/A  . Years of education: N/A   Occupational History  . Not on file.   Social History Main Topics  . Smoking status: Former Smoker    Packs/day: 0.25    Types: Cigarettes    Quit date: 2016  . Smokeless tobacco: Never Used  . Alcohol use 0.6 oz/week    1 Glasses of wine per week     Comment: occ, none now.  . Drug use: No  . Sexual activity: Yes    Birth control/ protection: None     Comment: INTERCOURSE AGE 44, SEXUAL PARTNERS MORE THAN 5    Other Topics Concern  . Not on file   Social History Narrative  . No narrative on file  Family History  Problem Relation Age of Onset  . Multiple sclerosis Mother   . Diabetes Father   . Hypertension Father   . Asthma Father   . Breast cancer Maternal Grandmother   . Cancer Maternal Grandmother   . Hypertension Maternal Grandmother   . Dementia Maternal Grandfather     BP 135/85   Pulse 97   Ht 5\' 5"  (1.651 m)   Wt 250 lb (113.4 kg)   BMI 41.60 kg/m   Review of Systems: See HPI above.     Objective:  Physical Exam:  Gen: NAD, comfortable in exam room  Left hand: No gross deformity, swelling, bruising, malrotation or angulation of digits. TTP 1st CMC.  No other tenderness.  FROM with pain on all motions of thumb especially abduction and extension.   Collateral ligaments intact of 1st MCP. NVI  distally.  Right hand: FROM without pain   Assessment & Plan:  1. Left thumb pain - I was unable to reproduce dislocation/subluxation of her left CMC joint though she is tender here.  Independently reviewed radiographs and no evidence concurrent fracture.  We discussed conservative vs aggressive treatment - will start with conservative as has not had any treatment to date and symptoms going on for 5 months.  Will start with thumb spica brace to rest this, icing, tylenol if needed.  If continued instability will go ahead with MRI.  If improving and no instability will add occupational therapy at follow-up.

## 2016-05-02 NOTE — Assessment & Plan Note (Signed)
I was unable to reproduce dislocation/subluxation of her left [facility] joint though she is tender here.  Independently reviewed radiographs and no evidence concurrent fracture.  We discussed conservative vs aggressive treatment - will start with conservative as has not had any treatment to date and symptoms going on for 5 months.  Will start with thumb spica brace to rest this, icing, tylenol if needed.  If continued instability will go ahead with MRI.  If improving and no instability will add occupational therapy at follow-up.

## 2016-05-03 ENCOUNTER — Ambulatory Visit (INDEPENDENT_AMBULATORY_CARE_PROVIDER_SITE_OTHER): Payer: Medicaid Other | Admitting: Family Medicine

## 2016-05-03 ENCOUNTER — Encounter: Payer: Self-pay | Admitting: Family Medicine

## 2016-05-03 DIAGNOSIS — M79645 Pain in left finger(s): Secondary | ICD-10-CM

## 2016-05-03 NOTE — Patient Instructions (Signed)
Continue with the brace. Icing if needed. We will go ahead with an MRI given your recurrent instability, dislocations of the thumb.

## 2016-05-04 NOTE — Progress Notes (Addendum)
PCP: No PCP Per Patient  Subjective:   HPI: Patient is a 26 y.o. female here for left thumb pain.  3/27: Patient reports she recalls this starting back in November of 2017. Believes she slept on her left hand wrong and left thumb was out of place. She pushed on base of this to put it back into place. States this has been occurring more since birth of her son in February. Has not sought care for this. Left handed. Pain level 4/10 now with thumb spica splint on, dull. Some swelling but this has improved. No skin changes, numbness.  3/29: Patient returns as she reports her left thumb dislocated again yesterday. Took off the splint, stretched her hand. Had to push this back into place again. Pain level 6/10 with associated swelling. No skin changes, numbness.  Past Medical History:  Diagnosis Date  . ACNE 04/04/2006   Qualifier: Diagnosis of  By: Abundio Miu    . Asthma   . ASTHMA, INTERMITTENT 04/04/2006   Qualifier: Diagnosis of  By: Lelon Perla MD, Vickki Muff    . Gallstones 2011  . 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 Infection    UTI  . LGSIL (low grade squamous intraepithelial dysplasia)   . Migraine 11/21/2011  . RHINITIS, ALLERGIC 04/04/2006   Qualifier: Diagnosis of  By: Abundio Miu    . Vaginal Pap smear, abnormal    ok since colpo    Current Outpatient Prescriptions on File Prior to Visit  Medication Sig Dispense Refill  . acetaminophen (TYLENOL) 500 MG tablet Take 500 mg by mouth every 6 (six) hours as needed for mild pain or headache.    . albuterol (PROVENTIL HFA;VENTOLIN HFA) 108 (90 Base) MCG/ACT inhaler Inhale 1 puff into the lungs every 6 (six) hours as needed for wheezing or shortness of breath. 1 Inhaler 0  . Elastic Bandages & Supports (COMFORT FIT MATERNITY SUPP LG) MISC 1 application by Does not apply route daily. 1 each 0  . Prenat w/o A Vit-FeFum-FePo-FA (CONCEPT OB) 130-92.4-1 MG CAPS Take 1 capsule by mouth daily. 30 capsule 11   No current  facility-administered medications on file prior to visit.     Past Surgical History:  Procedure Laterality Date  . CHOLECYSTECTOMY    . COLPOSCOPY  2009    Allergies  Allergen Reactions  . Eggs Or Egg-Derived Products Swelling  . Penicillins Rash    Has patient had a PCN reaction causing immediate rash, facial/tongue/throat swelling, SOB or lightheadedness with hypotension: unknown Has patient had a PCN reaction causing severe rash involving mucus membranes or skin necrosis: unknown Has patient had a PCN reaction that required hospitalization unknown Has patient had a PCN reaction occurring within the last 10 years: no If all of the above answers are "NO", then may proceed with Cephalosporin use.   . Sulfonamide Derivatives Rash    Social History   Social History  . Marital status: Single    Spouse name: N/A  . Number of children: N/A  . Years of education: N/A   Occupational History  . Not on file.   Social History Main Topics  . Smoking status: Former Smoker    Packs/day: 0.25    Types: Cigarettes    Quit date: 2016  . Smokeless tobacco: Never Used  . Alcohol use 0.6 oz/week    1 Glasses of wine per week     Comment: occ, none now.  . Drug use: No  . Sexual activity: Yes    Birth  control/ protection: None     Comment: INTERCOURSE AGE 89, SEXUAL PARTNERS MORE THAN 5    Other Topics Concern  . Not on file   Social History Narrative  . No narrative on file    Family History  Problem Relation Age of Onset  . Multiple sclerosis Mother   . Diabetes Father   . Hypertension Father   . Asthma Father   . Breast cancer Maternal Grandmother   . Cancer Maternal Grandmother   . Hypertension Maternal Grandmother   . Dementia Maternal Grandfather     BP 121/78   Pulse (!) 48   Ht 5\' 5"  (1.651 m)   Wt 150 lb (68 kg)   BMI 24.96 kg/m   Review of Systems: See HPI above.     Objective:  Physical Exam:  Gen: NAD, comfortable in exam room  Left hand: No  gross deformity, swelling, bruising, malrotation or angulation of digits. TTP 1st CMC, mild up the forearm and into thenar area also.  No other tenderness.  FROM with pain on all motions of thumb.   Collateral ligaments intact of 1st MCP. NVI distally.  Right hand: FROM without pain   Assessment & Plan:  1. Left thumb pain - Recurrent subluxation vs dislocation of 1st CMC joint.  Radiographs without evidence fracture.  Advised at this point we go ahead with MRI to further assess.  Will likely need to see hand surgeon regardless of results.  Icing, tylenol if needed.    Addendum:  MRI reviewed and discussed with patient.  No evidence ligament or tendon tears, other contributing factors to instability at [facility] joint.  She is not improving with thumb spica brace.  We discussed casting, possible surgical intervention.  I doubt OT would provide increased stability for this joint.  Will go ahead with referral to hand surgeon for evaluation, discussion.

## 2016-05-04 NOTE — Assessment & Plan Note (Signed)
Recurrent subluxation vs dislocation of 1st CMC joint.  Radiographs without evidence fracture.  Advised at this point we go ahead with MRI to further assess.  Will likely need to see hand surgeon regardless of results.  Icing, tylenol if needed.

## 2016-05-09 ENCOUNTER — Ambulatory Visit (INDEPENDENT_AMBULATORY_CARE_PROVIDER_SITE_OTHER): Payer: Medicaid Other | Admitting: Obstetrics and Gynecology

## 2016-05-09 ENCOUNTER — Encounter: Payer: Self-pay | Admitting: Obstetrics and Gynecology

## 2016-05-09 NOTE — Patient Instructions (Signed)

## 2016-05-09 NOTE — Progress Notes (Signed)
Subjective:     error

## 2016-05-09 NOTE — Progress Notes (Signed)
Subjective:     Tamara Garza is a 26 y.o. female who presents for a postpartum visit. She is 5 weeks postpartum following a spontaneous vaginal delivery. I have fully reviewed the prenatal and intrapartum course. The delivery was at [redacted]w[redacted]d gestational weeks. Outcome: spontaneous vaginal delivery. Anesthesia: epidural. Postpartum course has been unremarkable. Baby's course has been unremarkable. Baby is feeding by bottle - Similac Advance. Bleeding no bleeding. Bowel function is normal. Bladder function is normal. Patient is not sexually active. Contraception method is condoms. Postpartum depression screening: negative. States menstrual cycles irregular and infrequent 3-4 times per year since age 20 without heavy bleeding.  The following portions of the patient's history were reviewed and updated as appropriate: allergies, current medications, past family history, past medical history, past social history, past surgical history and problem list.  Review of Systems Pertinent items are noted in HPI.   Objective:    There were no vitals taken for this visit.  General:  alert and no distress   Breasts:  inspection negative, no nipple discharge or bleeding, no masses or nodularity palpable  Lungs: clear to auscultation bilaterally  Heart:  regular rate and rhythm, S1, S2 normal, no murmur, click, rub or gallop  Abdomen: soft, non-tender; bowel sounds normal; no masses,  no organomegaly   Vulva:  not examined  Vagina: not evaluated  Cervix:  not examined  Corpus: not examined  Adnexa:  not evaluated  Rectal Exam: Not performed.        Assessment:     5 weeks postpartum exam. Pap smear not done at today's visit.  Infrequent menses Plan:    1. Contraception: condoms 2. Counseled on use effectiveness of condoms versus other methods.  3. Follow up in: 6 months or as needed. See PCP Maryelizabeth Rowan, O/G NP as needed

## 2016-05-15 NOTE — Addendum Note (Signed)
Addended by: Kathi Simpers F on: 05/15/2016 10:43 AM   Modules accepted: Orders

## 2016-05-17 ENCOUNTER — Ambulatory Visit (HOSPITAL_BASED_OUTPATIENT_CLINIC_OR_DEPARTMENT_OTHER)
Admission: RE | Admit: 2016-05-17 | Discharge: 2016-05-17 | Disposition: A | Discharge status: Home / Self Care | Payer: Medicaid Other | Source: Ambulatory Visit | Attending: Family Medicine | Admitting: Family Medicine

## 2016-05-17 ENCOUNTER — Ambulatory Visit: Payer: Medicaid Other | Admitting: Obstetrics & Gynecology

## 2016-05-17 VITALS — BP 122/80 | HR 92 | Ht 65.0 in | Wt 249.9 lb

## 2016-05-17 DIAGNOSIS — R102 Pelvic and perineal pain: Secondary | ICD-10-CM

## 2016-05-17 DIAGNOSIS — M79645 Pain in left finger(s): Secondary | ICD-10-CM

## 2016-05-17 MED ORDER — LIDOCAINE HCL 2 % EX GEL: 1.0000 "application " | CUTANEOUS | 2 refills | Status: DC | PRN

## 2016-05-17 NOTE — Progress Notes (Signed)
   Subjective:    Patient ID: Tamara Garza, female    DOB: 1990/08/09, 26 y.o.   MRN: [id number]  HPI  26 yo S AA P1 s/p NSVD 7 weeks ago is here today with a sudden onset of a intermittent vaginal pain 2 days ago. Also she tried to have sex last week but found it very painful. She breastfed for a month and then changed to bottle feeding.  Review of Systems     Objective:   Physical Exam Pleasant morbidly obese BFNAD Breathing, conversing, and ambulating normally Very tender at the introitus. The skin and mucosa are all healed normally Dr. Hezzie Bump provided a second opinion/exam.       Assessment & Plan:  Introital pain- rec lidocaine jelly prn RTC prn

## 2016-05-24 NOTE — Addendum Note (Signed)
Addended by: Kathi Simpers F on: 05/24/2016 09:35 AM   Modules accepted: Orders

## 2016-05-26 ENCOUNTER — Ambulatory Visit (HOSPITAL_BASED_OUTPATIENT_CLINIC_OR_DEPARTMENT_OTHER)
Admission: RE | Admit: 2016-05-26 | Discharge: 2016-05-26 | Disposition: A | Discharge status: Home / Self Care | Payer: Medicaid Other | Source: Ambulatory Visit | Attending: Family Medicine | Admitting: Family Medicine

## 2016-05-26 DIAGNOSIS — M79645 Pain in left finger(s): Secondary | ICD-10-CM | POA: Diagnosis present

## 2016-05-30 NOTE — Addendum Note (Signed)
Addended by: Kathi Simpers F on: 05/30/2016 08:15 AM   Modules accepted: Orders

## 2016-06-12 ENCOUNTER — Ambulatory Visit: Payer: Self-pay | Admitting: Family Medicine

## 2016-07-18 ENCOUNTER — Encounter (HOSPITAL_BASED_OUTPATIENT_CLINIC_OR_DEPARTMENT_OTHER): Payer: Self-pay | Admitting: *Deleted

## 2016-07-18 ENCOUNTER — Encounter (HOSPITAL_BASED_OUTPATIENT_CLINIC_OR_DEPARTMENT_OTHER): Payer: Self-pay | Admitting: Emergency Medicine

## 2016-07-18 ENCOUNTER — Emergency Department (HOSPITAL_BASED_OUTPATIENT_CLINIC_OR_DEPARTMENT_OTHER)
Admission: EM | Admit: 2016-07-18 | Discharge: 2016-07-18 | Disposition: A | Discharge status: Home / Self Care | Payer: Medicaid Other | Attending: Emergency Medicine | Admitting: Emergency Medicine

## 2016-07-18 DIAGNOSIS — T63301A Toxic effect of unspecified spider venom, accidental (unintentional), initial encounter: Secondary | ICD-10-CM

## 2016-07-18 DIAGNOSIS — Z87891 Personal history of nicotine dependence: Secondary | ICD-10-CM | POA: Insufficient documentation

## 2016-07-18 DIAGNOSIS — L03113 Cellulitis of right upper limb: Secondary | ICD-10-CM | POA: Insufficient documentation

## 2016-07-18 DIAGNOSIS — Z7951 Long term (current) use of inhaled steroids: Secondary | ICD-10-CM | POA: Insufficient documentation

## 2016-07-18 DIAGNOSIS — J45909 Unspecified asthma, uncomplicated: Secondary | ICD-10-CM | POA: Insufficient documentation

## 2016-07-18 DIAGNOSIS — T63301D Toxic effect of unspecified spider venom, accidental (unintentional), subsequent encounter: Secondary | ICD-10-CM | POA: Insufficient documentation

## 2016-07-18 HISTORY — DX: Obesity, unspecified: E66.9

## 2016-07-18 MED ORDER — CLINDAMYCIN PHOSPHATE 600 MG/50ML IV SOLN
600.0000 mg | Freq: Once | INTRAVENOUS | Status: AC
  Administered 2016-07-18: 600 mg via INTRAVENOUS
  Filled 2016-07-18: qty 50

## 2016-07-18 MED ORDER — CLINDAMYCIN HCL 150 MG PO CAPS: 450.0000 mg | ORAL_CAPSULE | Freq: Three times a day (TID) | ORAL | 0 refills | Status: DC

## 2016-07-18 NOTE — ED Provider Notes (Signed)
[facility] Provider Note   CSN: [id number] Arrival date & time: 07/18/16  0009     History   Chief Complaint Chief Complaint  Patient presents with  . Insect Bite    HPI Tamara Garza is a 26 y.o. female  who was driving in her car yesterday, fell pain on her right upper arm and saw a spider. She says that the spider was medium sized and brown.  She is unable to further describe the spider as she immediately hit it and killed it.  She reports that today she has developed a low grade fever at home (100.3) and is slightly nauseated.  She denies chills.  Has not been taking anything at home for her pain.  Patient reports she is not pregnant.   HPI  Past Medical History:  Diagnosis Date  . ACNE 04/04/2006   Qualifier: Diagnosis of  By: Abundio Miu    . Asthma   . ASTHMA, INTERMITTENT 04/04/2006   Qualifier: Diagnosis of  By: Lelon Perla MD, Vickki Muff    . Gallstones 2011  . 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 Infection    UTI  . LGSIL (low grade squamous intraepithelial dysplasia)   . Migraine 11/21/2011  . Obesity   . RHINITIS, ALLERGIC 04/04/2006   Qualifier: Diagnosis of  By: Abundio Miu    . Vaginal Pap smear, abnormal    ok since colpo    Patient Active Problem List   Diagnosis Date Noted  . Thumb pain, left 05/02/2016  . PROM (premature rupture of membranes) 03/28/2016  . Group B Streptococcus carrier, +RV culture, currently pregnant 03/21/2016  . Former smoker 01/23/2016  . Supervision of normal pregnancy, antepartum 09/22/2015  . Migraine 11/21/2011  . Back pain 11/21/2011  . 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 Left breast mass 11/21/2011  . LGSIL (low grade squamous intraepithelial dysplasia)   . 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 Obesity 04/04/2006  . Hearing loss 04/04/2006  . RHINITIS, ALLERGIC 04/04/2006  . ASTHMA, INTERMITTENT 04/04/2006    Past Surgical History:  Procedure Laterality Date  . CHOLECYSTECTOMY    . COLPOSCOPY   2009    OB History    Gravida Para Term Preterm AB Living   1 1 1  0 0 1   SAB TAB Ectopic Multiple Live Births   0 0 0 0 1       Home Medications    Prior to Admission medications   Medication Sig Start Date End Date Taking? Authorizing Provider  acetaminophen (TYLENOL) 500 MG tablet Take 500 mg by mouth every 6 (six) hours as needed for mild pain or headache.    [provider]  albuterol (PROVENTIL HFA;VENTOLIN HFA) 108 (90 Base) MCG/ACT inhaler Inhale 1 puff into the lungs every 6 (six) hours as needed for wheezing or shortness of breath. 02/01/16   Alberta Bing, MD  clindamycin (CLEOCIN) 150 MG capsule Take 3 capsules (450 mg total) by mouth 3 (three) times daily. 07/18/16   Molpus, John, MD  lidocaine (XYLOCAINE) 2 % jelly Apply 1 application topically as needed. 05/17/16   Allie Bossier, MD    Family History Family History  Problem Relation Age of Onset  . Multiple sclerosis Mother   . Diabetes Father   . Hypertension Father   . Asthma Father   . Breast cancer Maternal Grandmother   . Cancer Maternal Grandmother   . Hypertension Maternal Grandmother   . Dementia Maternal Grandfather  Social History Social History  Substance Use Topics  . Smoking status: Former Smoker    Packs/day: 0.25    Quit date: 2016  . Smokeless tobacco: Never Used  . Alcohol use 0.6 oz/week    1 Glasses of wine per week     Comment: occ, none now.     Allergies   Eggs or egg-derived products; Penicillins; and Sulfonamide derivatives   Review of Systems Review of Systems  Constitutional: Positive for fever (Reported high of 100.3, afebrile at ED). Negative for chills and diaphoresis.  Eyes: Negative for visual disturbance.  Musculoskeletal: Negative for arthralgias, back pain, myalgias and neck pain.  Skin: Positive for color change (To right anterior distal upper arm) and wound. Negative for pallor and rash.  Neurological: Negative for headaches.     Physical  Exam Updated Vital Signs BP 106/74 (BP Location: Left Arm)   Pulse (!) 103   Temp 98.5 F (36.9 C) (Oral)   Resp 20   Ht 5\' 5"  (1.651 m)   Wt 117.9 kg (260 lb)   LMP 07/03/2016 (Approximate)   SpO2 99%   Breastfeeding? No   BMI 43.27 kg/m   Physical Exam  Constitutional: She appears well-developed and well-nourished. No distress.  Pulmonary/Chest: Effort normal and breath sounds normal. No respiratory distress. She has no wheezes.  Neurological: She is alert. No sensory deficit.  Skin: Skin is warm and dry. She is not diaphoretic.  8cm by 6cm area of erythema over right anterior distal upper arm.  There is a diffuse area of 2cm induration.  No fluctuance noted.  Pinpoint wound visible in the middle of the swelling.    Psychiatric: She has a normal mood and affect. Her behavior is normal.  Nursing note and vitals reviewed.    ED Treatments / Results  Labs (all labs ordered are listed, but only abnormal results are displayed) Labs Reviewed - No data to display  EKG  EKG Interpretation None       Radiology No results found.  Bedside ultrasound was performed with no obvious fluid collection or abscess noted.  Procedures Procedures (including critical care time)  Medications Ordered in ED Medications - No data to display   Initial Impression / Assessment and Plan / ED Course  I have reviewed the triage vital signs and the nursing notes.  Pertinent labs & imaging results that were available during my care of the patient were reviewed by me and considered in my medical decision making (see chart for details).    Tamara Garza presents with right upper extremity cellulitis after seeing a spider on her arm and experiencing pain. Patient is afebrile in the emergency room in no obvious distress. Exam is not consistent with abscess, no fluid collection noted on bedside ultrasound.  The area of erythema was marked with a skin marker.  Will start patient on clindamycin for  cellulitis with instructions to return in 2 days for wound recheck. She was given strict return precautions and informed that if she had any worsening or concerning symptoms she would need to follow up sooner. Patient was given the option to ask questions, all of which were interpreted the best of my ability.    Final Clinical Impressions(s) / ED Diagnoses   Final diagnoses:  Spider bite wound, accidental or unintentional, initial encounter  Cellulitis of right upper extremity    New Prescriptions New Prescriptions   CLINDAMYCIN (CLEOCIN) 150 MG CAPSULE    Take 3 capsules (450 mg total) by  mouth 3 (three) times daily.     Cristina Gong, PA-C 07/18/16 0139    Paula Libra, MD 07/18/16 [phone]

## 2016-07-18 NOTE — ED Provider Notes (Addendum)
[facility] Note   CSN: [id number] Arrival date & time: 07/18/16  1937  By signing my name below, I, Diona Browner, attest that this documentation has been prepared under the direction and in the presence of Rolan Bucco, MD. Electronically Signed: Diona Browner, ED Scribe. 07/18/16. 7:55 PM.  History   Chief Complaint Chief Complaint  Patient presents with  . Wound Check    HPI Tamara Garza is a 26 y.o. female who presents to the Emergency Department complaining of gradually worsening spider bite on her right arm that occurred on 07/16/16. Pt was seen here yesterday night (07/17/16) for same sx and was prescribed antibiotics and had a circle drawn around the area. She is unable to afford antibiotics and the area had grown. She didn't check her temperature today at home but doesn't note any fevers. Pt denies nausea and vomiting. Tetanus UTD.  The history is provided by the patient and medical records. No language interpreter was used.    Past Medical History:  Diagnosis Date  . ACNE 04/04/2006   Qualifier: Diagnosis of  By: Abundio Miu    . Asthma   . ASTHMA, INTERMITTENT 04/04/2006   Qualifier: Diagnosis of  By: Lelon Perla MD, Vickki Muff    . Gallstones 2011  . 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 Infection    UTI  . LGSIL (low grade squamous intraepithelial dysplasia)   . Migraine 11/21/2011  . Obesity   . RHINITIS, ALLERGIC 04/04/2006   Qualifier: Diagnosis of  By: Abundio Miu    . Vaginal Pap smear, abnormal    ok since colpo    Patient Active Problem List   Diagnosis Date Noted  . Thumb pain, left 05/02/2016  . PROM (premature rupture of membranes) 03/28/2016  . Group B Streptococcus carrier, +RV culture, currently pregnant 03/21/2016  . Former smoker 01/23/2016  . Supervision of normal pregnancy, antepartum 09/22/2015  . Migraine 11/21/2011  . Back pain 11/21/2011  . 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 Left breast mass  11/21/2011  . LGSIL (low grade squamous intraepithelial dysplasia)   . 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 Obesity 04/04/2006  . Hearing loss 04/04/2006  . RHINITIS, ALLERGIC 04/04/2006  . ASTHMA, INTERMITTENT 04/04/2006    Past Surgical History:  Procedure Laterality Date  . CHOLECYSTECTOMY    . COLPOSCOPY  2009    OB History    Gravida Para Term Preterm AB Living   1 1 1  0 0 1   SAB TAB Ectopic Multiple Live Births   0 0 0 0 1       Home Medications    Prior to Admission medications   Medication Sig Start Date End Date Taking? Authorizing Provider  acetaminophen (TYLENOL) 500 MG tablet Take 500 mg by mouth every 6 (six) hours as needed for mild pain or headache.    [provider]  albuterol (PROVENTIL HFA;VENTOLIN HFA) 108 (90 Base) MCG/ACT inhaler Inhale 1 puff into the lungs every 6 (six) hours as needed for wheezing or shortness of breath. 02/01/16   Shields Bing, MD  clindamycin (CLEOCIN) 150 MG capsule Take 3 capsules (450 mg total) by mouth 3 (three) times daily. 07/18/16   Molpus, John, MD  lidocaine (XYLOCAINE) 2 % jelly Apply 1 application topically as needed. 05/17/16   Allie Bossier, MD    Family History Family History  Problem Relation Age of Onset  . Multiple sclerosis Mother   . Diabetes Father   .  Hypertension Father   . Asthma Father   . Breast cancer Maternal Grandmother   . Cancer Maternal Grandmother   . Hypertension Maternal Grandmother   . Dementia Maternal Grandfather     Social History Social History  Substance Use Topics  . Smoking status: Former Smoker    Packs/day: 0.25    Quit date: 2016  . Smokeless tobacco: Never Used  . Alcohol use 0.6 oz/week    1 Glasses of wine per week     Comment: occ, none now.     Allergies   Eggs or egg-derived products; Penicillins; and Sulfonamide derivatives   Review of Systems Review of Systems  Constitutional: Negative for fever.  Gastrointestinal: Negative for nausea and vomiting.    Musculoskeletal: Negative for arthralgias, back pain, joint swelling and neck pain.  Skin: Positive for wound.  Neurological: Negative for weakness, numbness and headaches.     Physical Exam Updated Vital Signs BP (!) 125/56   Pulse 87   Temp 99 F (37.2 C) (Oral)   Resp 18   LMP 07/03/2016 (Approximate)   SpO2 100%   Physical Exam  Constitutional: She is oriented to person, place, and time. She appears well-developed and well-nourished.  HENT:  Head: Normocephalic and atraumatic.  Neck: Normal range of motion. Neck supple.  Cardiovascular: Normal rate.   Pulmonary/Chest: Effort normal.  Musculoskeletal: She exhibits edema and tenderness.  Neurological: She is alert and oriented to person, place, and time.  Skin: Skin is warm and dry.  8 cm area of erythema and slight induration to right upper forearm. No fluctuance or drainage. No other extension.  Psychiatric: She has a normal mood and affect.      ED Treatments / Results  DIAGNOSTIC STUDIES: Oxygen Saturation is 100% on RA, normal by my interpretation.   COORDINATION OF CARE: 7:55 PM-Discussed next steps with pt which includes a dose of IV antibiotics. If sx change or worsen, she is to return to the ED for reevaluation. Pt verbalized understanding and is agreeable with the plan.   Labs (all labs ordered are listed, but only abnormal results are displayed) Labs Reviewed - No data to display  EKG  EKG Interpretation None       Radiology No results found.  Procedures Procedures (including critical care time)  Medications Ordered in ED Medications  clindamycin (CLEOCIN) IVPB 600 mg (600 mg Intravenous New Bag/Given 07/18/16 2002)     Initial Impression / Assessment and Plan / ED Course  I have reviewed the triage vital signs and the nursing notes.  Pertinent labs & imaging results that were available during my care of the patient were reviewed by me and considered in my medical decision making (see  chart for details).     Pt was given a dose of IVF clinda.  TDAP UTD.  Patient has both penicillin and sulfa allergies. I advised her that clindamycin would be the best choice of antibiotics given this. Other antibiotics would be either incompatible with her allergies or more expensive. She did get a prescription drug card and has found a discount without for the clindamycin. She does feel like at this time she will be able to start her antibiotics tomorrow. Patient is not breast-feeding. There is a localized area of cellulitis but no evident abscess. She was advised to return if she has any worsening symptoms or her symptoms are not improving.  Final Clinical Impressions(s) / ED Diagnoses   Final diagnoses:  Cellulitis of right upper extremity  New Prescriptions New Prescriptions   No medications on file   I personally performed the services described in this documentation, which was scribed in my presence.  The recorded information has been reviewed and considered.     Rolan Bucco, MD 07/18/16 2010    Rolan Bucco, MD 07/18/16 2017

## 2016-07-18 NOTE — ED Notes (Signed)
ED Provider at bedside. 

## 2016-07-18 NOTE — Discharge Instructions (Signed)
Please take Ibuprofen (Advil, motrin) and Tylenol (acetaminophen) to relieve your pain.  You may take up to 800 MG (4 pills) of normal strength ibuprofen every 8 hours as needed.  In between doses of ibuprofen you make take tylenol, up to 1,000 mg (two extra strength pills).  Do not take more than 4,000 mg tylenol in a 24 hour period.  Please check all medication labels as many medications such as pain and cold medications may contain tylenol.  Do not drink while taking these medications.  Do not take other NSAID'S while taking ibuprofen (such as aleve or naproxen).  You're being prescribed antibiotics. Please be aware that all antibiotics may cause diarrhea. Some people find that probiotics can help decrease the amount or frequency of diarrhea. Please be aware that if you are taking any hormone based contraception that it will not work and you need to use backup contraception (condoms) for the remainder of this menstrual cycle or as otherwise directed by package inserts.

## 2016-07-18 NOTE — ED Triage Notes (Signed)
Pt sts she was bitten by a spider yesterday and today it is red, swollen and painful.  She has been running a low grade fever - 100.3 - and is nauseated.

## 2016-07-18 NOTE — ED Triage Notes (Signed)
Pt here for wound recheck. Seen here last night unable to afford ABX

## 2016-07-20 MED FILL — CLINDAMYCIN HCL 150 MG CAPS: 150 | 7 days supply | Qty: 63 | Fill #0

## 2016-08-16 ENCOUNTER — Ambulatory Visit (INDEPENDENT_AMBULATORY_CARE_PROVIDER_SITE_OTHER): Payer: Self-pay | Admitting: Physician Assistant

## 2016-08-21 ENCOUNTER — Ambulatory Visit (INDEPENDENT_AMBULATORY_CARE_PROVIDER_SITE_OTHER): Payer: Self-pay | Admitting: Physician Assistant

## 2016-08-27 ENCOUNTER — Telehealth: Payer: Self-pay | Admitting: Family Medicine

## 2016-08-27 NOTE — Telephone Encounter (Signed)
Unfortunately we provided her with the best option, the thumb spica brace.  This and occupational/hand therapy are probably the only measures that would help her.  I wouldn't expect medicines or any other brace to help.

## 2016-08-27 NOTE — Telephone Encounter (Signed)
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 She was unable to follow up with hand surgeon due to losing her insurance. Patient wanted to see if there are any other brace options that could help.  Patient was given [facility] financial assistance information

## 2016-08-28 NOTE — Telephone Encounter (Signed)
Spoke to patient and gave her information. Would like to do occupational therapy.

## 2016-10-19 ENCOUNTER — Emergency Department (HOSPITAL_BASED_OUTPATIENT_CLINIC_OR_DEPARTMENT_OTHER)
Admission: EM | Admit: 2016-10-19 | Discharge: 2016-10-19 | Disposition: A | Discharge status: Home / Self Care | Payer: Self-pay | Attending: Emergency Medicine | Admitting: Emergency Medicine

## 2016-10-19 ENCOUNTER — Emergency Department (HOSPITAL_BASED_OUTPATIENT_CLINIC_OR_DEPARTMENT_OTHER): Payer: Self-pay

## 2016-10-19 ENCOUNTER — Encounter (HOSPITAL_BASED_OUTPATIENT_CLINIC_OR_DEPARTMENT_OTHER): Payer: Self-pay

## 2016-10-19 DIAGNOSIS — Z87891 Personal history of nicotine dependence: Secondary | ICD-10-CM | POA: Insufficient documentation

## 2016-10-19 DIAGNOSIS — J069 Acute upper respiratory infection, unspecified: Secondary | ICD-10-CM | POA: Insufficient documentation

## 2016-10-19 DIAGNOSIS — B9789 Other viral agents as the cause of diseases classified elsewhere: Secondary | ICD-10-CM

## 2016-10-19 DIAGNOSIS — J40 Bronchitis, not specified as acute or chronic: Secondary | ICD-10-CM | POA: Insufficient documentation

## 2016-10-19 MED ORDER — ALBUTEROL SULFATE (2.5 MG/3ML) 0.083% IN NEBU
2.5000 mg | INHALATION_SOLUTION | Freq: Once | RESPIRATORY_TRACT | Status: AC
  Administered 2016-10-19: 2.5 mg via RESPIRATORY_TRACT
  Filled 2016-10-19: qty 3

## 2016-10-19 MED ORDER — SODIUM CHLORIDE 0.9 % IV BOLUS (SEPSIS)
1000.0000 mL | Freq: Once | INTRAVENOUS | Status: DC
Start: 2016-10-19 — End: 2016-10-20

## 2016-10-19 MED ORDER — IPRATROPIUM-ALBUTEROL 0.5-2.5 (3) MG/3ML IN SOLN
3.0000 mL | Freq: Once | RESPIRATORY_TRACT | Status: AC
  Administered 2016-10-19: 3 mL via RESPIRATORY_TRACT
  Filled 2016-10-19: qty 3

## 2016-10-19 NOTE — Discharge Instructions (Signed)
Your chest x-ray did not show pneumonia today.   I suspect you have viral bronchitis. This typically resolves within 2-3 weeks with conservative and supportive, over the counter measures.    Use throat lozanges, hot tea with honey, and/or salt water gargles to help with throat inflammation.  Taking tylenol 1000 mg will help throat pain and soreness.  Drink plenty of water, 2-3L daily, to avoid dehydration from chills and fevers.  During the day, take mucinex or robitussin to thin out mucus and facilitate clearance.   At night time, take a cough suppressant like Delsym to gives your chest muscles a break from coughing.  Use your albuterol inhaler over the next 3-5 days to help open up your airways.  Use flonase nasal spray to help with nasal congestion and runny nose.   Monitor your symptoms, they should resolve in 2-3 weeks.  Return to ED for chest pain, chest tightness, shortness of breath.

## 2016-10-19 NOTE — ED Notes (Signed)
Patient walked with pulse Ox patient O2 stayed between 97-99% and HR 125-140s

## 2016-10-19 NOTE — ED Notes (Signed)
ED Provider at bedside. 

## 2016-10-19 NOTE — ED Triage Notes (Signed)
C/o flu like sx x 1 week-NAD-steady gait 

## 2016-10-19 NOTE — ED Provider Notes (Signed)
[facility] Provider [facility]   CSN: [id number] Arrival date & time: 10/19/16  2008     History   Chief Complaint Chief Complaint  Patient presents with  . Cough    HPI Tamara Garza is a 26 y.o. female with remote h/o asthma presents to ED for evaluation of wet cough x 1 week, specks of blood noted today. Associated with nasal congestion, rhinorrhea, sore throat, chills, sweats, subjective fevers, upper chest pain with cough and deep breathing only. Symptoms have been getting worse. Positive sick contacts, has been around people with "respiratory infection" and pneumonia. Has taken mucinex and cough drops without relief. States she hasn't had to use her albuterol inhaler in over 2 years. No tobacco use.    HPI  Past Medical History:  Diagnosis Date  . ACNE 04/04/2006   Qualifier: Diagnosis of  By: Abundio Miu    . Asthma   . ASTHMA, INTERMITTENT 04/04/2006   Qualifier: Diagnosis of  By: Lelon Perla MD, Vickki Muff    . Gallstones 2011  . 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 Infection    UTI  . LGSIL (low grade squamous intraepithelial dysplasia)   . Migraine 11/21/2011  . Obesity   . RHINITIS, ALLERGIC 04/04/2006   Qualifier: Diagnosis of  By: Abundio Miu    . Vaginal Pap smear, abnormal    ok since colpo    Patient Active Problem List   Diagnosis Date Noted  . Thumb pain, left 05/02/2016  . PROM (premature rupture of membranes) 03/28/2016  . Group B Streptococcus carrier, +RV culture, currently pregnant 03/21/2016  . Former smoker 01/23/2016  . Supervision of normal pregnancy, antepartum 09/22/2015  . Migraine 11/21/2011  . Back pain 11/21/2011  . 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 Left breast mass 11/21/2011  . LGSIL (low grade squamous intraepithelial dysplasia)   . 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 Obesity 04/04/2006  . Hearing loss 04/04/2006  . RHINITIS, ALLERGIC 04/04/2006  . ASTHMA, INTERMITTENT 04/04/2006    Past Surgical History:    Procedure Laterality Date  . CHOLECYSTECTOMY    . COLPOSCOPY  2009    OB History    Gravida Para Term Preterm AB Living   0 0 1   SAB TAB Ectopic Multiple Live Births   0 0 0 0 1       Home Medications    Prior to Admission medications   Medication Sig Start Date End Date Taking? Authorizing Provider  albuterol (PROVENTIL HFA;VENTOLIN HFA) 108 (90 Base) MCG/ACT inhaler Inhale 1 puff into the lungs every 6 (six) hours as needed for wheezing or shortness of breath. 02/01/16   [place] Bing, MD    Family History Family History  Problem Relation Age of Onset  . Multiple sclerosis Mother   . Diabetes Father   . Hypertension Father   . Asthma Father   . Breast cancer Maternal Grandmother   . Cancer Maternal Grandmother   . Hypertension Maternal Grandmother   . Dementia Maternal Grandfather     Social History Social History  Substance Use Topics  . Smoking status: Former Smoker    Packs/day: 0.25    Quit date: 2016  . Smokeless tobacco: Never Used  . Alcohol use No     Allergies   Eggs or egg-derived products; Penicillins; and Sulfonamide derivatives   Review of Systems Review of Systems  Constitutional: Positive for chills, diaphoresis and fever (subjective).  HENT: Positive for congestion, postnasal  drip, rhinorrhea and sore throat.   Respiratory: Positive for cough. Negative for chest tightness.   Cardiovascular: Positive for chest pain (with cough and deep breathing).  Gastrointestinal: Negative for abdominal pain, diarrhea and vomiting.  Allergic/Immunologic: Negative for immunocompromised state.  Neurological: Negative for headaches.     Physical Exam Updated Vital Signs BP 105/67 (BP Location: Right Arm)   Pulse (!) 102   Temp 98.8 F (37.1 C) (Oral)   Resp 20   Ht  (1.651 m)   Wt 118.6 kg (261 lb 7.5 oz)   SpO2 100%   BMI 43.51 kg/m   Physical Exam  Constitutional: She is oriented to person, place, and time. She appears  well-developed and well-nourished. No distress.  NAD. Not toxic appearing.  HENT:  Head: Normocephalic and atraumatic.  Right Ear: External ear normal.  Left Ear: External ear normal.  Nose: Mucosal edema and rhinorrhea present. Right sinus exhibits no maxillary sinus tenderness and no frontal sinus tenderness. Left sinus exhibits no maxillary sinus tenderness and no frontal sinus tenderness.  Mouth/Throat: Uvula is midline and mucous membranes are normal. No trismus in the jaw. No uvula swelling. Posterior oropharyngeal erythema present. No oropharyngeal exudate, posterior oropharyngeal edema or tonsillar abscesses. Tonsils are 1+ on the right. Tonsils are 1+ on the left.  Eyes: Conjunctivae and EOM are normal. No scleral icterus.  Neck: Normal range of motion. Neck supple.  Cardiovascular: Regular rhythm, S1 normal, S2 normal and normal heart sounds.  Tachycardia present.   No murmur heard. Pulses:      Radial pulses are 2+ on the right side, and 2+ on the left side.       Dorsalis pedis pulses are 2+ on the right side, and 2+ on the left side.  Pulmonary/Chest: Effort normal. She has wheezes in the right middle field and the right lower field. She has rhonchi in the right middle field, the right lower field, the left middle field and the left lower field.  Wheezing and rhonchi noted to bilateral ML and LL (R>L) Sternal and upper chest wall tenderness  Abdominal: Normal appearance and bowel sounds are normal. There is no tenderness.  Musculoskeletal: Normal range of motion. She exhibits no deformity.  Neurological: She is alert and oriented to person, place, and time.  Skin: Skin is warm and dry. Capillary refill takes less than 2 seconds.  Psychiatric: She has a normal mood and affect. Her behavior is normal. Judgment and thought content normal.  Nursing note and vitals reviewed.    ED Treatments / Results  Labs (all labs ordered are listed, but only abnormal results are  displayed) Labs Reviewed  CBC WITH DIFFERENTIAL/PLATELET  BASIC METABOLIC PANEL    EKG  EKG Interpretation None       Radiology Dg Chest 2 View  Result Date: 10/19/2016 CLINICAL DATA:  Wheezing with cough and chest pain EXAM: CHEST  2 VIEW COMPARISON:  April 16, 2012 FINDINGS: Lungs are clear. Heart size and pulmonary vascularity are normal. No adenopathy. No pneumothorax. No bone lesions. IMPRESSION: No edema or consolidation. Electronically Signed   By: Bretta Bang III M.D.   On: 10/19/2016 21:05    Procedures Procedures (including critical care time)  Medications Ordered in ED Medications  sodium chloride 0.9 % bolus 1,000 mL (not administered)  ipratropium-albuterol (DUONEB) 0.5-2.5 (3) MG/3ML nebulizer solution 3 mL (3 mLs Nebulization Given 10/19/16 2030)  albuterol (PROVENTIL) (2.5 MG/3ML) 0.083% nebulizer solution 2.5 mg (2.5 mg Nebulization Given 10/19/16 2030)  Initial Impression / Assessment and Plan / ED Course  I have reviewed the triage vital signs and the nursing notes.  Pertinent labs & imaging results that were available during my care of the patient were reviewed by me and considered in my medical decision making (see chart for details).  Clinical Course as of Oct 19 2245  Fri Oct 19, 2016  2216 Put patient on the monitor, HR 100-105. She would like to avoid IV fluids.   [CG]    Clinical Course User Index [CG] Liberty Handy, PA-C   26 yo female with remote, well controlled asthma presents to ED for evaluation of cough, a/w subjective fevers, chills, sore throat, congestion. Symptoms worsening over 1 week. Sick contacts noted. On exam she is non toxic appearing and afebrile, but tachycardic in 110-130s while in ED.  She has diffuse middle and lower lobe rhonchi and wheezing, worse on R even after duoneb. Mild upper chest wall tenderness, likely secondary to cough. Doubt ACS. CXR obtained, negative for infiltrate.  She ambulated with pulse ox  which remained >97%, her HR was up to 140 during ambulation. She is not orthostatic. Will observe HR trend  2219: Placed pt on monitor, NSR. HR has been improving, now <110. Suspect viral illnes and duoneb contributing to initial elevated HR.  Discussed importance of oral hydration as outpatient. She did not want IVF or basic blood work in ED. Will d/c with conservative management for bronchitis. She is aware of s/s that would warrant return to ED. Patient, ED treatment and discharge plan was discussed with supervising physician who is agreeable with plan.   Final Clinical Impressions(s) / ED Diagnoses   Final diagnoses:  Viral URI with cough  Bronchitis    New Prescriptions New Prescriptions   No medications on file     Jerrell Mylar 10/19/16 2247    Pricilla Loveless, MD 10/19/16 [phone]

## 2016-12-03 ENCOUNTER — Emergency Department (HOSPITAL_BASED_OUTPATIENT_CLINIC_OR_DEPARTMENT_OTHER)
Admission: EM | Admit: 2016-12-03 | Discharge: 2016-12-04 | Disposition: A | Discharge status: Home / Self Care | Payer: No Typology Code available for payment source | Attending: Emergency Medicine | Admitting: Emergency Medicine

## 2016-12-03 ENCOUNTER — Encounter (HOSPITAL_BASED_OUTPATIENT_CLINIC_OR_DEPARTMENT_OTHER): Payer: Self-pay | Admitting: *Deleted

## 2016-12-03 DIAGNOSIS — S161XXA Strain of muscle, fascia and tendon at neck level, initial encounter: Secondary | ICD-10-CM | POA: Insufficient documentation

## 2016-12-03 DIAGNOSIS — R51 Headache: Secondary | ICD-10-CM | POA: Diagnosis not present

## 2016-12-03 DIAGNOSIS — Y92410 Unspecified street and highway as the place of occurrence of the external cause: Secondary | ICD-10-CM | POA: Insufficient documentation

## 2016-12-03 DIAGNOSIS — S199XXA Unspecified injury of neck, initial encounter: Secondary | ICD-10-CM | POA: Diagnosis present

## 2016-12-03 DIAGNOSIS — R519 Headache, unspecified: Secondary | ICD-10-CM

## 2016-12-03 DIAGNOSIS — Z87891 Personal history of nicotine dependence: Secondary | ICD-10-CM | POA: Insufficient documentation

## 2016-12-03 DIAGNOSIS — Y999 Unspecified external cause status: Secondary | ICD-10-CM | POA: Insufficient documentation

## 2016-12-03 DIAGNOSIS — J45909 Unspecified asthma, uncomplicated: Secondary | ICD-10-CM | POA: Diagnosis not present

## 2016-12-03 DIAGNOSIS — Y939 Activity, unspecified: Secondary | ICD-10-CM | POA: Diagnosis not present

## 2016-12-03 MED ORDER — CYCLOBENZAPRINE HCL 5 MG PO TABS: 10.0000 mg | ORAL_TABLET | Freq: Two times a day (BID) | ORAL | 0 refills | Status: DC | PRN

## 2016-12-03 MED ORDER — IBUPROFEN 800 MG PO TABS
800.0000 mg | ORAL_TABLET | Freq: Once | ORAL | Status: AC
Start: 2016-12-03 — End: 2016-12-03
  Administered 2016-12-03: 800 mg via ORAL
  Filled 2016-12-03: qty 1

## 2016-12-03 MED ORDER — IBUPROFEN 600 MG PO TABS: 600.0000 mg | ORAL_TABLET | Freq: Four times a day (QID) | ORAL | 0 refills | Status: DC | PRN
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
## 2017-01-24 ENCOUNTER — Other Ambulatory Visit: Payer: Self-pay

## 2017-01-24 ENCOUNTER — Encounter (HOSPITAL_BASED_OUTPATIENT_CLINIC_OR_DEPARTMENT_OTHER): Payer: Self-pay | Admitting: Emergency Medicine

## 2017-01-24 ENCOUNTER — Emergency Department (HOSPITAL_BASED_OUTPATIENT_CLINIC_OR_DEPARTMENT_OTHER)
Admission: EM | Admit: 2017-01-24 | Discharge: 2017-01-24 | Disposition: A | Discharge status: Home / Self Care | Payer: Self-pay | Attending: Emergency Medicine | Admitting: Emergency Medicine

## 2017-01-24 DIAGNOSIS — M5432 Sciatica, left side: Secondary | ICD-10-CM | POA: Insufficient documentation

## 2017-01-24 DIAGNOSIS — Z87891 Personal history of nicotine dependence: Secondary | ICD-10-CM | POA: Insufficient documentation

## 2017-01-24 DIAGNOSIS — Z3490 Encounter for supervision of normal pregnancy, unspecified, unspecified trimester: Secondary | ICD-10-CM

## 2017-01-24 DIAGNOSIS — Z331 Pregnant state, incidental: Secondary | ICD-10-CM | POA: Insufficient documentation

## 2017-01-24 DIAGNOSIS — J45909 Unspecified asthma, uncomplicated: Secondary | ICD-10-CM | POA: Insufficient documentation

## 2017-01-24 LAB — URINALYSIS, ROUTINE W REFLEX MICROSCOPIC
BILIRUBIN URINE: NEGATIVE
Glucose, UA: NEGATIVE mg/dL
HGB URINE DIPSTICK: NEGATIVE
KETONES UR: NEGATIVE mg/dL
Leukocytes, UA: NEGATIVE
NITRITE: NEGATIVE
PH: 6 (ref 5.0–8.0)
Protein, ur: NEGATIVE mg/dL
Specific Gravity, Urine: >1.03 — ABNORMAL HIGH (ref 1.005–1.030)

## 2017-01-24 LAB — PREGNANCY, URINE: Preg Test, Ur: POSITIVE — AB

## 2017-01-24 MED ORDER — ACETAMINOPHEN 500 MG PO TABS
1000.0000 mg | ORAL_TABLET | Freq: Once | ORAL | Status: AC
  Administered 2017-01-24: 1000 mg via ORAL
  Filled 2017-01-24: qty 2

## 2017-01-24 NOTE — ED Provider Notes (Signed)
[facility] EMERGENCY DEPARTMENT Provider Note   CSN: [id number] Arrival date & time: 01/24/17  0204     History   Chief Complaint Chief Complaint  Patient presents with  . Sciatica    HPI Tamara Garza is a 26 y.o. female.  The history is provided by the patient.  Back Pain   This is a recurrent problem. The current episode started yesterday. The problem has not changed since onset.The pain is associated with no known injury. The pain is present in the gluteal region. The quality of the pain is described as shooting. The pain radiates to the left thigh. The pain is at a severity of 8/10. The pain is severe. The symptoms are aggravated by bending and certain positions. The pain is the same all the time. Pertinent negatives include no chest pain, no fever, no numbness, no weight loss, no headaches, no abdominal pain, no abdominal swelling, no bowel incontinence, no perianal numbness, no bladder incontinence, no dysuria, no pelvic pain, no paresthesias, no paresis, no tingling and no weakness. She has tried nothing for the symptoms. The treatment provided no relief.    Past Medical History:  Diagnosis Date  . ACNE 04/04/2006   Qualifier: Diagnosis of  By: Abundio MiuMcGregor, Barbara    . Asthma   . ASTHMA, INTERMITTENT 04/04/2006   Qualifier: Diagnosis of  By: Lelon PerlaSaunders MD, Vickki MuffWeston    . Gallstones 2011  . 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 Infection    UTI  . LGSIL (low grade squamous intraepithelial dysplasia)   . Migraine 11/21/2011  . Obesity   . RHINITIS, ALLERGIC 04/04/2006   Qualifier: Diagnosis of  By: Abundio MiuMcGregor, Barbara    . Vaginal Pap smear, abnormal    ok since colpo    Patient Active Problem List   Diagnosis Date Noted  . Thumb pain, left 05/02/2016  . PROM (premature rupture of membranes) 03/28/2016  . Group B Streptococcus carrier, +RV culture, currently pregnant 03/21/2016  . Former smoker 01/23/2016  . Supervision of normal pregnancy, antepartum 09/22/2015  .  Migraine 11/21/2011  . Back pain 11/21/2011  . 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 Left breast mass 11/21/2011  . LGSIL (low grade squamous intraepithelial dysplasia)   . 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 Obesity 04/04/2006  . Hearing loss 04/04/2006  . RHINITIS, ALLERGIC 04/04/2006  . ASTHMA, INTERMITTENT 04/04/2006    Past Surgical History:  Procedure Laterality Date  . CHOLECYSTECTOMY    . COLPOSCOPY  2009    OB History    Gravida Para Term Preterm AB Living   1 1 1  0 0 1   SAB TAB Ectopic Multiple Live Births   0 0 0 0 1       Home Medications    Prior to Admission medications   Medication Sig Start Date End Date Taking? Authorizing Provider  acetaminophen (TYLENOL) 325 MG tablet Take 650 mg by mouth every 6 (six) hours as needed.   Yes [provider]    Family History Family History  Problem Relation Age of Onset  . Multiple sclerosis Mother   . Diabetes Father   . Hypertension Father   . Asthma Father   . Breast cancer Maternal Grandmother   . Cancer Maternal Grandmother   . Hypertension Maternal Grandmother   . Dementia Maternal Grandfather     Social History Social History   Tobacco Use  . Smoking status: Former Smoker    Packs/day: 0.25  Last attempt to quit: 2016    Years since quitting: 2.9  . Smokeless tobacco: Never Used  Substance Use Topics  . Alcohol use: No  . Drug use: No     Allergies   Eggs or egg-derived products; Penicillins; and Sulfonamide derivatives   Review of Systems Review of Systems  Constitutional: Negative for fever and weight loss.  Cardiovascular: Negative for chest pain.  Gastrointestinal: Negative for abdominal pain, bowel incontinence and nausea.  Genitourinary: Negative for bladder incontinence, dysuria, pelvic pain, vaginal bleeding and vaginal discharge.  Musculoskeletal: Positive for back pain.  Neurological: Negative for tingling, weakness, numbness, headaches and paresthesias.    All other systems reviewed and are negative.    Physical Exam Updated Vital Signs BP 119/77 (BP Location: Left Arm)   Pulse 95   Temp 98.1 F (36.7 C) (Oral)   Resp 18   SpO2 98%   Physical Exam  Constitutional: She is oriented to person, place, and time. She appears well-developed and well-nourished. No distress.  HENT:  Head: Normocephalic and atraumatic.  Nose: Nose normal.  Mouth/Throat: No oropharyngeal exudate.  Eyes: Conjunctivae are normal. Pupils are equal, round, and reactive to light.  Neck: Normal range of motion. Neck supple.  Cardiovascular: Normal rate, regular rhythm, normal heart sounds and intact distal pulses.  Pulmonary/Chest: Effort normal and breath sounds normal. No stridor. She has no wheezes. She has no rales.  Abdominal: Soft. Bowel sounds are normal. She exhibits no mass. There is no tenderness. There is no rebound and no guarding.  Musculoskeletal: Normal range of motion.  Neurological: She is alert and oriented to person, place, and time.  Skin: Skin is warm and dry. Capillary refill takes less than 2 seconds.  Psychiatric: She has a normal mood and affect.     ED Treatments / Results  Labs (all labs ordered are listed, but only abnormal results are displayed) Labs Reviewed  PREGNANCY, URINE - Abnormal; Notable for the following components:      Result Value   Preg Test, Ur POSITIVE (*)    All other components within normal limits  URINALYSIS, ROUTINE W REFLEX MICROSCOPIC - Abnormal; Notable for the following components:   Specific Gravity, Urine >1.030 (*)    All other components within normal limits     Procedures Procedures (including critical care time)  Medications Ordered in ED Medications  acetaminophen (TYLENOL) tablet 1,000 mg (1,000 mg Oral Given 01/24/17 0259)       Final Clinical Impressions(s) / ED Diagnoses   Final diagnoses:  Sciatica of left side  Pregnancy, unspecified gestational age    Return for  persistent fevers > 101,pelvic pain vaginal discharge or bleeding, weakness numbness inability to pass urine leakage of stool, global weakness, stiff neck, intractable vomiting, or diarrhea, abdominal pain, Inability to tolerate liquids or food, cough, altered mental status or any concerns. No signs of systemic illness or infection. The patient is nontoxic-appearing on exam and vital signs are within normal limits.    I have reviewed the triage vital signs and the nursing notes. Pertinent labs &imaging results that were available during my care of the patient were reviewed by me and considered in my medical decision making (see chart for details).  After history, exam, and medical workup I feel the patient has been appropriately medically screened and is safe for discharge home. Pertinent diagnoses were discussed with the patient. Patient was given return precautions   Keerthana Vanrossum, MD 01/24/17 [phone]

## 2017-01-24 NOTE — ED Notes (Signed)
Pt given heat packs and further suggestions to relieve her sciatica pain.  Pt verbalizes understanding of dc instructions and denies any further needs at this time.

## 2017-01-24 NOTE — ED Notes (Addendum)
Pt c/o left lower back pain that radiates down left leg that started today. She has tried tylenol, ice and stretching without relief.  Pt's child at the bedside, pt given some water to attempt a urine sample.

## 2017-01-24 NOTE — ED Triage Notes (Signed)
Pt c/o pain radiating down left leg. Pt reports hx of sciatica

## 2017-01-29 ENCOUNTER — Encounter (HOSPITAL_COMMUNITY): Payer: Self-pay | Admitting: *Deleted

## 2017-01-29 ENCOUNTER — Emergency Department (HOSPITAL_BASED_OUTPATIENT_CLINIC_OR_DEPARTMENT_OTHER)
Admission: EM | Admit: 2017-01-29 | Discharge: 2017-01-29 | Discharge status: Against Medical Advice | Payer: Medicaid Other | Attending: Emergency Medicine | Admitting: Emergency Medicine

## 2017-01-29 ENCOUNTER — Inpatient Hospital Stay (EMERGENCY_DEPARTMENT_HOSPITAL)
Admission: AD | Admit: 2017-01-29 | Discharge: 2017-01-30 | Disposition: A | Discharge status: Home / Self Care | Payer: Medicaid Other | Source: Ambulatory Visit | Attending: Family Medicine | Admitting: Family Medicine

## 2017-01-29 ENCOUNTER — Inpatient Hospital Stay (HOSPITAL_COMMUNITY): Payer: Medicaid Other

## 2017-01-29 ENCOUNTER — Encounter: Payer: Self-pay | Admitting: Emergency Medicine

## 2017-01-29 ENCOUNTER — Other Ambulatory Visit: Payer: Self-pay

## 2017-01-29 DIAGNOSIS — Z3490 Encounter for supervision of normal pregnancy, unspecified, unspecified trimester: Secondary | ICD-10-CM

## 2017-01-29 DIAGNOSIS — Z87891 Personal history of nicotine dependence: Secondary | ICD-10-CM

## 2017-01-29 DIAGNOSIS — O99519 Diseases of the respiratory system complicating pregnancy, unspecified trimester: Secondary | ICD-10-CM

## 2017-01-29 DIAGNOSIS — O9989 Other specified diseases and conditions complicating pregnancy, childbirth and the puerperium: Secondary | ICD-10-CM | POA: Insufficient documentation

## 2017-01-29 DIAGNOSIS — R1031 Right lower quadrant pain: Secondary | ICD-10-CM | POA: Diagnosis present

## 2017-01-29 DIAGNOSIS — O26899 Other specified pregnancy related conditions, unspecified trimester: Secondary | ICD-10-CM

## 2017-01-29 DIAGNOSIS — O209 Hemorrhage in early pregnancy, unspecified: Secondary | ICD-10-CM | POA: Insufficient documentation

## 2017-01-29 DIAGNOSIS — J45909 Unspecified asthma, uncomplicated: Secondary | ICD-10-CM

## 2017-01-29 DIAGNOSIS — Z882 Allergy status to sulfonamides status: Secondary | ICD-10-CM | POA: Insufficient documentation

## 2017-01-29 DIAGNOSIS — Z5329 Procedure and treatment not carried out because of patient's decision for other reasons: Secondary | ICD-10-CM | POA: Diagnosis not present

## 2017-01-29 DIAGNOSIS — Z3A00 Weeks of gestation of pregnancy not specified: Secondary | ICD-10-CM

## 2017-01-29 DIAGNOSIS — Z880 Allergy status to penicillin: Secondary | ICD-10-CM

## 2017-01-29 DIAGNOSIS — R102 Pelvic and perineal pain: Secondary | ICD-10-CM

## 2017-01-29 DIAGNOSIS — Z79899 Other long term (current) drug therapy: Secondary | ICD-10-CM | POA: Insufficient documentation

## 2017-01-29 DIAGNOSIS — R109 Unspecified abdominal pain: Secondary | ICD-10-CM

## 2017-01-29 DIAGNOSIS — O3680X0 Pregnancy with inconclusive fetal viability, not applicable or unspecified: Secondary | ICD-10-CM

## 2017-01-29 LAB — HCG, QUANTITATIVE, PREGNANCY: hCG, Beta Chain, Quant, S: 1283 m[IU]/mL — ABNORMAL HIGH (ref <5)

## 2017-01-29 LAB — COMPREHENSIVE METABOLIC PANEL
ALK PHOS: 64 U/L (ref 38–126)
ALT: 19 U/L (ref 14–54)
AST: 23 U/L (ref 15–41)
Albumin: 3.7 g/dL (ref 3.5–5.0)
Anion gap: 8 (ref 5–15)
BILIRUBIN TOTAL: 0.4 mg/dL (ref 0.3–1.2)
BUN: 10 mg/dL (ref 6–20)
CALCIUM: 8.8 mg/dL — AB (ref 8.9–10.3)
CO2: 23 mmol/L (ref 22–32)
Chloride: 107 mmol/L (ref 101–111)
Creatinine, Ser: 0.65 mg/dL (ref 0.44–1.00)
GFR calc Af Amer: >60 mL/min (ref >60)
Glucose, Bld: 97 mg/dL (ref 65–99)
POTASSIUM: 3.7 mmol/L (ref 3.5–5.1)
Sodium: 138 mmol/L (ref 135–145)
TOTAL PROTEIN: 7.2 g/dL (ref 6.5–8.1)

## 2017-01-29 LAB — WET PREP, GENITAL
CLUE CELLS WET PREP: NONE SEEN
Sperm: NONE SEEN
Trich, Wet Prep: NONE SEEN
YEAST WET PREP: NONE SEEN

## 2017-01-29 LAB — CBC
HEMATOCRIT: 34.5 % — AB (ref 36.0–46.0)
HEMOGLOBIN: 11.2 g/dL — AB (ref 12.0–15.0)
MCH: 27.7 pg (ref 26.0–34.0)
MCHC: 32.5 g/dL (ref 30.0–36.0)
MCV: 85.4 fL (ref 78.0–100.0)
Platelets: 270 10*3/uL (ref 150–400)
RBC: 4.04 MIL/uL (ref 3.87–5.11)
RDW: 13.8 % (ref 11.5–15.5)
WBC: 5.6 10*3/uL (ref 4.0–10.5)

## 2017-01-29 LAB — URINALYSIS, ROUTINE W REFLEX MICROSCOPIC
Bilirubin Urine: NEGATIVE
GLUCOSE, UA: NEGATIVE mg/dL
Hgb urine dipstick: NEGATIVE
Ketones, ur: NEGATIVE mg/dL
LEUKOCYTES UA: NEGATIVE
Nitrite: NEGATIVE
PH: 6 (ref 5.0–8.0)
Protein, ur: NEGATIVE mg/dL
Specific Gravity, Urine: >1.03 — ABNORMAL HIGH (ref 1.005–1.030)

## 2017-01-29 MED ORDER — SODIUM CHLORIDE 0.9 % IV BOLUS (SEPSIS)
1000.0000 mL | Freq: Once | INTRAVENOUS | Status: AC
  Administered 2017-01-29: 1000 mL via INTRAVENOUS

## 2017-01-29 MED ORDER — MORPHINE SULFATE (PF) 4 MG/ML IV SOLN
4.0000 mg | Freq: Once | INTRAVENOUS | Status: DC
  Filled 2017-01-29: qty 1

## 2017-01-29 MED ORDER — ONDANSETRON HCL 4 MG/2ML IJ SOLN
4.0000 mg | Freq: Once | INTRAMUSCULAR | Status: AC
Start: 2017-01-29 — End: 2017-01-29
  Administered 2017-01-29: 4 mg via INTRAVENOUS
  Filled 2017-01-29: qty 2

## 2017-01-29 NOTE — ED Provider Notes (Signed)
[facility] EMERGENCY DEPARTMENT Provider Note   CSN: [id number] Arrival date & time: 01/29/17  2045     History   Chief Complaint No chief complaint on file.   HPI Tamara Garza is a 26 y.o. female.  HPI  26 y.o. female G2P1 with a hx of Asthma, Obesity, presents to the Emergency Department today due to abdominal pain. States that the pain occurred around 1700 and has been a constant sharp pain that she rates 7/10. Noted vaginal spotting initially, but resolved. Pain has not changed. Pt unsure how far along she is. Pt states that she has irregular menstrual cycles. Pt sexually active with one partner. Denies N/V/D. No CP/SOB. No fevers. Pt with one other pregnancy. No complications. Pt without IUD. No other symptoms noted   Past Medical History:  Diagnosis Date  . ACNE 04/04/2006   Qualifier: Diagnosis of  By: Abundio MiuMcGregor, Barbara    . Asthma   . ASTHMA, INTERMITTENT 04/04/2006   Qualifier: Diagnosis of  By: Lelon PerlaSaunders MD, Vickki MuffWeston    . Gallstones 2011  . 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 Infection    UTI  . LGSIL (low grade squamous intraepithelial dysplasia)   . Migraine 11/21/2011  . Obesity   . RHINITIS, ALLERGIC 04/04/2006   Qualifier: Diagnosis of  By: Abundio MiuMcGregor, Barbara    . Vaginal Pap smear, abnormal    ok since colpo    Patient Active Problem List   Diagnosis Date Noted  . Thumb pain, left 05/02/2016  . PROM (premature rupture of membranes) 03/28/2016  . Group B Streptococcus carrier, +RV culture, currently pregnant 03/21/2016  . Former smoker 01/23/2016  . Supervision of normal pregnancy, antepartum 09/22/2015  . Migraine 11/21/2011  . Back pain 11/21/2011  . 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 Left breast mass 11/21/2011  . LGSIL (low grade squamous intraepithelial dysplasia)   . 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 Obesity 04/04/2006  . Hearing loss 04/04/2006  . RHINITIS, ALLERGIC 04/04/2006  . ASTHMA, INTERMITTENT 04/04/2006    Past  Surgical History:  Procedure Laterality Date  . CHOLECYSTECTOMY    . COLPOSCOPY  2009    OB History    Gravida Para Term Preterm AB Living   1 1 1  0 0 1   SAB TAB Ectopic Multiple Live Births   0 0 0 0 1       Home Medications    Prior to Admission medications   Medication Sig Start Date End Date Taking? Authorizing Provider  acetaminophen (TYLENOL) 325 MG tablet Take 650 mg by mouth every 6 (six) hours as needed.    [provider]    Family History Family History  Problem Relation Age of Onset  . Multiple sclerosis Mother   . Diabetes Father   . Hypertension Father   . Asthma Father   . Breast cancer Maternal Grandmother   . Cancer Maternal Grandmother   . Hypertension Maternal Grandmother   . Dementia Maternal Grandfather     Social History Social History   Tobacco Use  . Smoking status: Former Smoker    Packs/day: 0.25    Last attempt to quit: 2016    Years since quitting: 2.9  . Smokeless tobacco: Never Used  Substance Use Topics  . Alcohol use: No  . Drug use: No     Allergies   Eggs or egg-derived products; Penicillins; and Sulfonamide derivatives   Review of Systems Review of Systems ROS reviewed and all  are negative for acute change except as noted in the HPI.  Physical Exam Updated Vital Signs BP 121/86   Pulse 89   Temp 98.1 F (36.7 C) (Oral)   Resp 18   LMP  (LMP Unknown)   SpO2 100%   Breastfeeding? No   Physical Exam  Constitutional: She is oriented to person, place, and time. She appears well-developed and well-nourished. No distress.  HENT:  Head: Normocephalic and atraumatic.  Right Ear: Tympanic membrane, external ear and ear canal normal.  Left Ear: Tympanic membrane, external ear and ear canal normal.  Nose: Nose normal.  Mouth/Throat: Uvula is midline, oropharynx is clear and moist and mucous membranes are normal. No trismus in the jaw. No oropharyngeal exudate, posterior oropharyngeal erythema or tonsillar  abscesses.  Eyes: EOM are normal. Pupils are equal, round, and reactive to light.  Neck: Normal range of motion. Neck supple. No tracheal deviation present.  Cardiovascular: Normal rate, regular rhythm, S1 normal, S2 normal, normal heart sounds, intact distal pulses and normal pulses.  Pulmonary/Chest: Effort normal and breath sounds normal. No respiratory distress. She has no decreased breath sounds. She has no wheezes. She has no rhonchi. She has no rales.  Abdominal: Normal appearance and bowel sounds are normal. There is tenderness in the right lower quadrant. There is no rigidity, no rebound, no guarding, no CVA tenderness, no tenderness at McBurney's point and negative Murphy's sign.  Musculoskeletal: Normal range of motion.  Neurological: She is alert and oriented to person, place, and time.  Skin: Skin is warm and dry.  Psychiatric: She has a normal mood and affect. Her speech is normal and behavior is normal. Thought content normal.  Nursing note and vitals reviewed.  Exam performed by Eston Esters,  exam chaperoned Date: 01/29/2017 Pelvic exam: normal external genitalia without evidence of trauma. VULVA: normal appearing vulva with no masses, tenderness or lesion. VAGINA: normal appearing vagina with normal color and discharge, no lesions. CERVIX: normal appearing cervix without lesions, cervical motion tenderness absent, cervical os closed with out purulent discharge; vaginal discharge - clear and malodorous, Wet prep and DNA probe for chlamydia and GC obtained.   ADNEXA: normal adnexa in size, TTP Right Adnexa. No masses UTERUS: uterus is normal size, shape, consistency and nontender.    ED Treatments / Results  Labs (all labs ordered are listed, but only abnormal results are displayed) Labs Reviewed  WET PREP, GENITAL - Abnormal; Notable for the following components:      Result Value   WBC, Wet Prep HPF POC MANY (*)    All other components within normal limits  CBC -  Abnormal; Notable for the following components:   Hemoglobin 11.2 (*)    HCT 34.5 (*)    All other components within normal limits  URINALYSIS, ROUTINE W REFLEX MICROSCOPIC - Abnormal; Notable for the following components:   Specific Gravity, Urine >1.030 (*)    All other components within normal limits  HCG, QUANTITATIVE, PREGNANCY - Abnormal; Notable for the following components:   hCG, Beta Chain, Quant, S 1,283 (*)    All other components within normal limits  COMPREHENSIVE METABOLIC PANEL - Abnormal; Notable for the following components:   Calcium 8.8 (*)    All other components within normal limits  GC/CHLAMYDIA PROBE AMP ([place]) NOT AT [facility]    EKG  EKG Interpretation None       Radiology No results found.  Procedures Procedures (including critical care time)  Medications Ordered in ED  Medications  sodium chloride 0.9 % bolus 1,000 mL (1,000 mLs Intravenous New Bag/Given 01/29/17 2130)  ondansetron (ZOFRAN) injection 4 mg (4 mg Intravenous Given 01/29/17 2130)     Initial Impression / Assessment and Plan / ED Course  I have reviewed the triage vital signs and the nursing notes.  Pertinent labs & imaging results that were available during my care of the patient were reviewed by me and considered in my medical decision making (see chart for details).  Final Clinical Impressions(s) / ED Diagnoses  {I have reviewed and evaluated the relevant laboratory values.   {I have reviewed the relevant previous healthcare records.  {I obtained HPI from historian.   ED Course:  Assessment: Pt is a 26 y.o. female G2P1 with a hx of Asthma, Obesity, presents to the Emergency Department today due to abdominal pain. States that the pain occurred around 1700 and has been a constant sharp pain that she rates 7/10. Noted vaginal spotting initially, but resolved. Pain has not changed. Pt unsure how far along she is. Pt states that she has irregular menstrual cycles. Pt sexually  active with one partner. Denies N/V/D. No CP/SOB. No fevers. Pt with one other pregnancy. No complications. Pt without IUD. On exam, pt in NAD. Nontoxic/nonseptic appearing. VSS. Afebrile. Lungs CTA. Heart RRR. Abdomen TTP RLQ. Pelvic Exam with adnexal tenderness on right. Left unremarkable. No CMT. No active bleeding. Os closed. CBC unremarkable. CMP unremarkable. UA negative. HCG Quant 1,283. Pt ABO/Rh Positive from review of previous labs. Unfortunately no ultrasound is available tonight due to holidays to r/o Ectopic or Torsion. Consult placed to OBGYN (Dr. Shawnie Pons) who recommended transfer to [facility] for ultrasound evaluation. Discussed results with patient and need for transfer, but patient states that she cannot leave her car here as it is the only one in the family and that she could not go via ambulance. Discussed that she would be leaving AMA and patient agrees to this. Understands that she needs to drive directly to [facility] for an urgent ultrasound evaluation.   9:59 PM  I have seen and evaluated Tamara Garza. She is currently awake, alert, and oriented and has requested to leave the emergency department against medial advice. I have explained that we have no completed out evaluation and treatment and that the risks of leaving may include the worsening of her condition, additional pain or disability, the need for further and more invasive medical acre, and death, risks that we are unable to predict at this time due to the incomplete evaltuation. I discussed the above with the patient who has the capacity to understand and understood the discussion and is, without force or coercion, still requesting to leave. Patient was made aware that she can return to the ED at any time without prejudice. The patient was given warning, medications were prescribed as needed, and follow-up was given when appropriate. Patient was further advised to seek follow-up care as directed or with their own personal  provider, or the provider of their choosing.    Disposition/Plan:  AMA Encouraged to go [facility]. Pt states that she will. Notified Dr. Shawnie Pons of OBGYN of her arrival via POV  Supervising Physician Rolland Porter, MD  Final diagnoses:  Pregnancy, unspecified gestational age  Adnexal tenderness, right    ED Discharge Orders    None       Wilber Bihari 01/29/17 2202    Rolland Porter, MD 01/29/17 [phone]

## 2017-01-29 NOTE — ED Triage Notes (Signed)
Presents with vaginal spotting and lower abdominal pain. She was told she was pregnant here last week but she does not know how far along she is because of irregular periods.

## 2017-01-29 NOTE — MAU Note (Signed)
Pt C/O right sided, lower abd pain since 1700. She was referred to MAU from [facility] for U/S to R/O ectopic pregnancy. Pt drove self over.Denies any vag bleeding.

## 2017-01-30 ENCOUNTER — Encounter (HOSPITAL_COMMUNITY): Payer: Self-pay | Admitting: Family

## 2017-01-30 ENCOUNTER — Inpatient Hospital Stay (HOSPITAL_COMMUNITY): Payer: Medicaid Other

## 2017-01-30 DIAGNOSIS — Z3A01 Less than 8 weeks gestation of pregnancy: Secondary | ICD-10-CM

## 2017-01-30 DIAGNOSIS — R109 Unspecified abdominal pain: Secondary | ICD-10-CM

## 2017-01-30 DIAGNOSIS — O26891 Other specified pregnancy related conditions, first trimester: Secondary | ICD-10-CM

## 2017-01-30 NOTE — MAU Provider Note (Signed)
History   [id number]   Chief Complaint  Patient presents with  . Pelvic Pain   HPI Tamara Garza is a 26 y.o. female  G2P1001 here from [facility] for ultrasound for rule-out ectopic.  Pt presented today with report of lower pelvic pain.  Also reported spotting of blood with resolution prior to arriving to ED.  Patient reports pregnancy however uncertain of last menstrual period.  Denies any additional symptoms.  No LMP recorded (lmp unknown). Patient is pregnant.  OB History  Gravida Para Term Preterm AB Living  2 1 1  0 0 1  SAB TAB Ectopic Multiple Live Births  0 0 0 0 1    # Outcome Date GA Lbr Len/2nd Weight Sex Delivery Anes PTL Lv  2 Current           1 Term 03/30/16 [redacted]w[redacted]d 59:17 / 00:12 6 lb 6 oz (2.892 kg) M Vag-Spont EPI  LIV      Past Medical History:  Diagnosis Date  . ACNE 04/04/2006   Qualifier: Diagnosis of  By: Abundio Miu    . Asthma   . ASTHMA, INTERMITTENT 04/04/2006   Qualifier: Diagnosis of  By: Lelon Perla MD, Vickki Muff    . Gallstones 2011  . 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 Infection    UTI  . LGSIL (low grade squamous intraepithelial dysplasia)   . Migraine 11/21/2011  . Obesity   . RHINITIS, ALLERGIC 04/04/2006   Qualifier: Diagnosis of  By: Abundio Miu    . Vaginal Pap smear, abnormal    ok since colpo    Family History  Problem Relation Age of Onset  . Multiple sclerosis Mother   . Diabetes Father   . Hypertension Father   . Asthma Father   . Breast cancer Maternal Grandmother   . Cancer Maternal Grandmother   . Hypertension Maternal Grandmother   . Dementia Maternal Grandfather     Social History   Socioeconomic History  . Marital status: Single    Spouse name: None  . Number of children: None  . Years of education: None  . Highest education level: None  Social Needs  . Financial resource strain: None  . Food insecurity - worry: None  . Food insecurity - inability: None  . Transportation needs - medical: None  .  Transportation needs - non-medical: None  Occupational History  . None  Tobacco Use  . Smoking status: Former Smoker    Packs/day: 0.25    Last attempt to quit: 2016    Years since quitting: 2.9  . Smokeless tobacco: Never Used  Substance and Sexual Activity  . Alcohol use: No  . Drug use: No  . Sexual activity: Yes    Birth control/protection: None    Comment: last sex 28 Jan 2017  Other Topics Concern  . None  Social History Narrative  . None    Allergies  Allergen Reactions  . Eggs Or Egg-Derived Products Swelling  . Penicillins Rash    Has patient had a PCN reaction causing immediate rash, facial/tongue/throat swelling, SOB or lightheadedness with hypotension: unknown Has patient had a PCN reaction causing severe rash involving mucus membranes or skin necrosis: unknown Has patient had a PCN reaction that required hospitalization unknown Has patient had a PCN reaction occurring within the last 10 years: no If all of the above answers are "NO", then may proceed with Cephalosporin use.   . Sulfonamide Derivatives Rash    No current facility-administered medications on file prior  to encounter.    Current Outpatient Medications on File Prior to Encounter  Medication Sig Dispense Refill  . acetaminophen (TYLENOL) 325 MG tablet Take 650 mg by mouth every 6 (six) hours as needed.       Review of Systems  Constitutional: Negative for chills and fever.  Gastrointestinal: Negative for nausea and vomiting.  Genitourinary: Positive for pelvic pain and vaginal bleeding (spotting). Negative for dysuria and frequency.  Musculoskeletal: Negative for back pain.  All other systems reviewed and are negative.    Physical Exam   Vitals:   01/29/17 2259  BP: 130/78  Pulse: 92  Resp: 16  Temp: 98.2 F (36.8 C)  TempSrc: Oral  Weight: 250 lb (113.4 kg)  Height: 5\' 6"  (1.676 m)    Physical Exam  Constitutional: She is oriented to person, place, and time. She appears  well-developed and well-nourished.  HENT:  Head: Normocephalic.  Neck: Normal range of motion. Neck supple.  Cardiovascular: Normal rate, regular rhythm and normal heart sounds.  Respiratory: Effort normal and breath sounds normal. No respiratory distress.  GI: Soft. There is no tenderness.  Genitourinary: Uterus is not enlarged. Cervix exhibits no motion tenderness. No bleeding (scant) in the vagina.  Musculoskeletal: Normal range of motion. She exhibits no edema.  Neurological: She is alert and oriented to person, place, and time.  Skin: Skin is warm and dry.    MAU Course  Procedures  MDM Results for orders placed or performed during the hospital encounter of 01/29/17 (from the past 24 hour(s))  Urinalysis, Routine w reflex microscopic     Status: Abnormal   Collection Time: 01/29/17  8:55 PM  Result Value Ref Range   Color, Urine YELLOW YELLOW   APPearance CLEAR CLEAR   Specific Gravity, Urine >1.030 (H) 1.005 - 1.030   pH 6.0 5.0 - 8.0   Glucose, UA NEGATIVE NEGATIVE mg/dL   Hgb urine dipstick NEGATIVE NEGATIVE   Bilirubin Urine NEGATIVE NEGATIVE   Ketones, ur NEGATIVE NEGATIVE mg/dL   Protein, ur NEGATIVE NEGATIVE mg/dL   Nitrite NEGATIVE NEGATIVE   Leukocytes, UA NEGATIVE NEGATIVE  CBC     Status: Abnormal   Collection Time: 01/29/17  9:01 PM  Result Value Ref Range   WBC 5.6 4.0 - 10.5 K/uL   RBC 4.04 3.87 - 5.11 MIL/uL   Hemoglobin 11.2 (L) 12.0 - 15.0 g/dL   HCT 16.134.5 (L) 09.636.0 - 04.546.0 %   MCV 85.4 78.0 - 100.0 fL   MCH 27.7 26.0 - 34.0 pg   MCHC 32.5 30.0 - 36.0 g/dL   RDW 40.913.8 81.111.5 - 91.415.5 %   Platelets 270 150 - 400 K/uL  hCG, quantitative, pregnancy     Status: Abnormal   Collection Time: 01/29/17  9:01 PM  Result Value Ref Range   hCG, Beta Chain, Quant, S 1,283 (H) <5 mIU/mL  Wet prep, genital     Status: Abnormal   Collection Time: 01/29/17  9:01 PM  Result Value Ref Range   Yeast Wet Prep HPF POC NONE SEEN NONE SEEN   Trich, Wet Prep NONE SEEN NONE  SEEN   Clue Cells Wet Prep HPF POC NONE SEEN NONE SEEN   WBC, Wet Prep HPF POC MANY (A) NONE SEEN   Sperm NONE SEEN   Comprehensive metabolic panel     Status: Abnormal   Collection Time: 01/29/17  9:01 PM  Result Value Ref Range   Sodium 138 135 - 145 mmol/L   Potassium 3.7 3.5 -  5.1 mmol/L   Chloride 107 101 - 111 mmol/L   CO2 23 22 - 32 mmol/L   Glucose, Bld 97 65 - 99 mg/dL   BUN 10 6 - 20 mg/dL   Creatinine, Ser 1.61 0.44 - 1.00 mg/dL   Calcium 8.8 (L) 8.9 - 10.3 mg/dL   Total Protein 7.2 6.5 - 8.1 g/dL   Albumin 3.7 3.5 - 5.0 g/dL   AST 23 15 - 41 U/L   ALT 19 14 - 54 U/L   Alkaline Phosphatase 64 38 - 126 U/L   Total Bilirubin 0.4 0.3 - 1.2 mg/dL   GFR calc non Af Amer >60 >60 mL/min   GFR calc Af Amer >60 >60 mL/min   Anion gap 8 5 - 15   Ultrasound: IMPRESSION: 1. Intrauterine sac-like structure as described. No fetal pole or yolk sac identified. Correlation with clinical exam and HCG levels and follow-up with ultrasound in 7 - 11 days, or earlier if clinically indicated, recommended. Please note the possibility of an ectopic pregnancy is not entirely excluded on this ultrasound. 2. Small subchorionic hemorrhage. 3. Probable right ovarian hemorrhagic cyst/corpus luteum.  Assessment and Plan  Pregnancy of Unknown Location Subchorionic Hemorrhage  Plan: Discharge home Follow-up BHCG in 48 hours, ultrasound in 10 days. Ectopic precautions reviewed   Marlis Edelson, CNM 01/30/2017 1:26 AM

## 2017-01-31 LAB — GC/CHLAMYDIA PROBE AMP (~~LOC~~) NOT AT ARMC
Chlamydia: NEGATIVE
NEISSERIA GONORRHEA: NEGATIVE

## 2017-02-01 ENCOUNTER — Ambulatory Visit: Payer: Self-pay | Admitting: *Deleted

## 2017-02-01 ENCOUNTER — Encounter: Payer: Self-pay | Admitting: *Deleted

## 2017-02-01 DIAGNOSIS — O3680X0 Pregnancy with inconclusive fetal viability, not applicable or unspecified: Secondary | ICD-10-CM

## 2017-02-01 LAB — HCG, QUANTITATIVE, PREGNANCY: HCG, BETA CHAIN, QUANT, S: 3809 m[IU]/mL — AB (ref <5)

## 2017-02-01 NOTE — Progress Notes (Signed)
Reviewed results with Dr.Anyanwu. Instructed to inform patient it looks like a good rise in hormone , but still not ruled out ectopic needs another ultrasound in 7-11 days.  I called Tamara Garza and unsure if voice mail picked up, attempted to leave message to call us for results. Sent a Mychart message I tried to reach her, call us for results but do have an [place] scheduled , left appt date/time  Called Tamara Garza again and informed her results and reccommendations per Dr. Macon Large and [place] appointment. Also gave her ectopic precautions. She voices understanding.

## 2017-02-01 NOTE — Progress Notes (Signed)
Patient here for stat bhcg today. Patient denies pain or bleeding. Discussed with patient her waiting in lobby for results & updated plan of care. Patient states she cannot stay today because it's mandatory she go to work. Patient states she already discussed this with the provider when she was in MAU. Patient states she didn't even think she could come today but came by before work. Patient provided reliable phone number for contact and is aware leaving is against our recommendation. Patient is also aware that she may be called to come back to the hospital immediately. Patient verbalized understanding to all & had no questions at this time

## 2017-02-03 NOTE — Progress Notes (Signed)
Agree with nursing staff's documentation of this patient's clinic encounter.  Catalina Antigua, MD 02/03/2017 10:07 AM

## 2017-02-05 NOTE — L&D Delivery Note (Signed)
Patient: Tamara Garza MRN: [id number]  GBS status: Positive, IAP given Clindamycin followed by Ancef   Patient is a 27 y.o. now G2P2 s/p NSVD at [redacted]w[redacted]d, who was admitted for SOL. SROM 3h 65m prior to delivery with clear fluid.    Delivery Note At 3:40 PM a viable female was delivered via Vaginal, Spontaneous (Presentation: OA).  APGAR: 8, 9; weight 8 lb 10.1 oz (3915 g).   Placenta status: spontaneous, intact.  Cord: 3 vessel with the following complications: nuchal x1.   Anesthesia:  Epidural with 10 cc Lidocaine for repair  Episiotomy: None Lacerations: 2nd degree;Perineal;Periurethral Suture Repair: 3.0 vicryl and 3.0 Monocryl  Est. Blood Loss (mL): 450   Head delivered OA prior to entrance to the delivery room. Nuchal cord present x1, reduced prior to delivery of shoulders. Shoulder and body delivered in usual fashion. Infant with spontaneous cry, placed on mother's abdomen, dried and bulb suctioned. Cord clamped x 2 after 2-minute delay, and cut by family member. Cord blood drawn. Placenta delivered spontaneously with gentle cord traction. Fundus firm with massage and Pitocin. Perineum inspected and found to have 2nd degree laceration, which was repaired with 3.0 vicryl and 3.0 monocryl with good hemostasis achieved. Right periurethral tear was repaired with 3.0 monocryl.    Mom to postpartum.  Baby to Couplet care / Skin to Skin.  De Hollingshead 09/27/2017, 4:50 PM

## 2017-02-08 ENCOUNTER — Ambulatory Visit (HOSPITAL_COMMUNITY)
Admission: RE | Admit: 2017-02-08 | Discharge: 2017-02-08 | Disposition: A | Discharge status: Home / Self Care | Payer: Medicaid Other | Source: Ambulatory Visit | Attending: Obstetrics & Gynecology | Admitting: Obstetrics & Gynecology

## 2017-02-08 ENCOUNTER — Other Ambulatory Visit (HOSPITAL_COMMUNITY): Payer: Self-pay

## 2017-02-08 DIAGNOSIS — O3680X0 Pregnancy with inconclusive fetal viability, not applicable or unspecified: Secondary | ICD-10-CM | POA: Diagnosis present

## 2017-02-08 DIAGNOSIS — Z3491 Encounter for supervision of normal pregnancy, unspecified, first trimester: Secondary | ICD-10-CM | POA: Insufficient documentation

## 2017-02-08 DIAGNOSIS — Z3A01 Less than 8 weeks gestation of pregnancy: Secondary | ICD-10-CM | POA: Insufficient documentation

## 2017-02-08 NOTE — Progress Notes (Signed)
Ultrasound results reviewed with patient and questions answered. Encouraged patient to start prenatal care at OB/GYN of choice. Patient already taken prenatal vitamins. Vaginal bleeding and pain precautions reviewed with patient. Patient verbalized understanding of results.   Rolm Bookbinder, CNM 02/08/17 4:30 PM

## 2017-02-11 ENCOUNTER — Ambulatory Visit: Payer: Self-pay

## 2017-03-07 ENCOUNTER — Encounter (HOSPITAL_BASED_OUTPATIENT_CLINIC_OR_DEPARTMENT_OTHER): Payer: Self-pay | Admitting: *Deleted

## 2017-03-07 ENCOUNTER — Other Ambulatory Visit: Payer: Self-pay

## 2017-03-07 ENCOUNTER — Emergency Department (HOSPITAL_BASED_OUTPATIENT_CLINIC_OR_DEPARTMENT_OTHER): Payer: Medicaid Other

## 2017-03-07 ENCOUNTER — Emergency Department (HOSPITAL_BASED_OUTPATIENT_CLINIC_OR_DEPARTMENT_OTHER)
Admission: EM | Admit: 2017-03-07 | Discharge: 2017-03-07 | Disposition: A | Discharge status: Home / Self Care | Payer: Medicaid Other | Attending: Physician Assistant | Admitting: Physician Assistant

## 2017-03-07 DIAGNOSIS — O98511 Other viral diseases complicating pregnancy, first trimester: Secondary | ICD-10-CM | POA: Diagnosis not present

## 2017-03-07 DIAGNOSIS — J45909 Unspecified asthma, uncomplicated: Secondary | ICD-10-CM | POA: Diagnosis not present

## 2017-03-07 DIAGNOSIS — Z87891 Personal history of nicotine dependence: Secondary | ICD-10-CM | POA: Insufficient documentation

## 2017-03-07 DIAGNOSIS — O9989 Other specified diseases and conditions complicating pregnancy, childbirth and the puerperium: Secondary | ICD-10-CM | POA: Diagnosis present

## 2017-03-07 DIAGNOSIS — O99511 Diseases of the respiratory system complicating pregnancy, first trimester: Secondary | ICD-10-CM | POA: Insufficient documentation

## 2017-03-07 DIAGNOSIS — Z3A10 10 weeks gestation of pregnancy: Secondary | ICD-10-CM | POA: Insufficient documentation

## 2017-03-07 DIAGNOSIS — B349 Viral infection, unspecified: Secondary | ICD-10-CM | POA: Insufficient documentation

## 2017-03-07 MED ORDER — AZITHROMYCIN 250 MG PO TABS: 250.0000 mg | ORAL_TABLET | Freq: Every day | ORAL | 0 refills | Status: DC

## 2017-03-07 MED FILL — AZITHROMYCIN 250 MG TABLET: 250 | 5 days supply | Qty: 6 | Fill #0

## 2017-03-07 NOTE — ED Provider Notes (Signed)
[facility] EMERGENCY DEPARTMENT Provider Note   CSN: [id number] Arrival date & time: 03/07/17  1413     History   Chief Complaint Chief Complaint  Patient presents with  . Influenza    HPI Tamara Garza is a 27 y.o. female.  HPI   27 year old female [redacted] weeks pregnant presenting with sinus congestion, ear pain, cough, fatigue.  No high fevers.  Patient's 43-month-old son has the same thing.  As is her husband.  Reports no nausea no vomiting.  Normal vital signs.  No high fevers.  Patient does report increased cough.  Will get chest x-ray to rule out pneumonia.  Patient is allergic to sulfa and penicillins.  Past Medical History:  Diagnosis Date  . ACNE 04/04/2006   Qualifier: Diagnosis of  By: Abundio Miu    . Asthma   . ASTHMA, INTERMITTENT 04/04/2006   Qualifier: Diagnosis of  By: Lelon Perla MD, Vickki Muff    . Gallstones 2011  . 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 Infection    UTI  . LGSIL (low grade squamous intraepithelial dysplasia)   . Migraine 11/21/2011  . Obesity   . RHINITIS, ALLERGIC 04/04/2006   Qualifier: Diagnosis of  By: Abundio Miu    . Vaginal Pap smear, abnormal    ok since colpo    Patient Active Problem List   Diagnosis Date Noted  . Thumb pain, left 05/02/2016  . PROM (premature rupture of membranes) 03/28/2016  . Group B Streptococcus carrier, +RV culture, currently pregnant 03/21/2016  . Former smoker 01/23/2016  . Supervision of normal pregnancy, antepartum 09/22/2015  . Migraine 11/21/2011  . Back pain 11/21/2011  . 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 Left breast mass 11/21/2011  . LGSIL (low grade squamous intraepithelial dysplasia)   . 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 Obesity 04/04/2006  . Hearing loss 04/04/2006  . RHINITIS, ALLERGIC 04/04/2006  . ASTHMA, INTERMITTENT 04/04/2006    Past Surgical History:  Procedure Laterality Date  . CHOLECYSTECTOMY    . COLPOSCOPY  2009    OB History    Gravida Para  Term Preterm AB Living   2 1 1  0 0 1   SAB TAB Ectopic Multiple Live Births   0 0 0 0 1       Home Medications    Prior to Admission medications   Medication Sig Start Date End Date Taking? Authorizing Provider  acetaminophen (TYLENOL) 325 MG tablet Take 650 mg by mouth every 6 (six) hours as needed.    [provider]    Family History Family History  Problem Relation Age of Onset  . Multiple sclerosis Mother   . Diabetes Father   . Hypertension Father   . Asthma Father   . Breast cancer Maternal Grandmother   . Cancer Maternal Grandmother   . Hypertension Maternal Grandmother   . Dementia Maternal Grandfather     Social History Social History   Tobacco Use  . Smoking status: Former Smoker    Packs/day: 0.25    Last attempt to quit: 2016    Years since quitting: 3.0  . Smokeless tobacco: Never Used  Substance Use Topics  . Alcohol use: No  . Drug use: No     Allergies   Eggs or egg-derived products; Penicillins; and Sulfonamide derivatives   Review of Systems Review of Systems  Constitutional: Positive for fatigue. Negative for activity change and fever.  HENT: Positive for congestion.   Respiratory: Positive for  cough and shortness of breath.   Cardiovascular: Negative for chest pain.  Gastrointestinal: Negative for abdominal pain.  All other systems reviewed and are negative.    Physical Exam Updated Vital Signs BP (!) 141/96   Pulse 92   Temp 98.5 F (36.9 C) (Oral)   Resp 16   Ht 5\' 5"  (1.651 m)   Wt 113.4 kg (250 lb)   LMP  (LMP Unknown)   SpO2 99%   BMI 41.60 kg/m   Physical Exam  Constitutional: She is oriented to person, place, and time. She appears well-developed and well-nourished.  HENT:  Head: Normocephalic and atraumatic.  Bilateral TMs with opacification, fluid.  No erythema.  Eyes: Right eye exhibits no discharge. Left eye exhibits no discharge.  Cardiovascular: Normal rate, regular rhythm and normal heart sounds.    No murmur heard. Pulmonary/Chest: Effort normal and breath sounds normal. She has no wheezes. She has no rales.  Abdominal: Soft. She exhibits no distension. There is no tenderness.  Neurological: She is oriented to person, place, and time.  Skin: Skin is warm and dry. She is not diaphoretic.  Psychiatric: She has a normal mood and affect.  Nursing note and vitals reviewed.    ED Treatments / Results  Labs (all labs ordered are listed, but only abnormal results are displayed) Labs Reviewed - No data to display  EKG  EKG Interpretation None       Radiology No results found.  Procedures Procedures (including critical care time)  Medications Ordered in ED Medications - No data to display   Initial Impression / Assessment and Plan / ED Course  I have reviewed the triage vital signs and the nursing notes.  Pertinent labs & imaging results that were available during my care of the patient were reviewed by me and considered in my medical decision making (see chart for details).     27 year old female [redacted] weeks pregnant presenting with sinus congestion, ear pain, cough, fatigue.  No high fevers.  Patient's 75-month-old son has the same thing.  As is her husband.  Reports no nausea no vomiting.  Normal vital signs.  No high fevers.  Patient does report increased cough.  Will get chest x-ray to rule out pneumonia.  Patient is allergic to sulfa and penicillins.  4:41 PM Concern for pneumonia given her long prolonged symptoms.  Will get x-ray.  If negative will give watch and wait antibiotics.  Think is likely viral in nature, however given the appearance of her TMs, could argue for treatment with antibiotics.  Will have patient see if she develops high fevers or continues to get worse she will treat with antibiotics.  Otherwise have her hold off.  Final Clinical Impressions(s) / ED Diagnoses   Final diagnoses:  None    ED Discharge Orders    None       Abelino Derrick, MD 03/07/17 2308

## 2017-03-07 NOTE — ED Triage Notes (Signed)
Pt c/o flu like symptoms x 2 weeks n/v/d, preg 10 weeks

## 2017-03-07 NOTE — Discharge Instructions (Signed)
We think infection is likely viral which means that you do not need antibiotics you just need time and supportive care.  However we have given you some antibiotics in case that you get worse, increased fever, or other concerns.  Please follow-up with your primary care provider as well.

## 2017-03-18 ENCOUNTER — Encounter (HOSPITAL_COMMUNITY): Payer: Self-pay | Admitting: *Deleted

## 2017-03-18 ENCOUNTER — Inpatient Hospital Stay (HOSPITAL_COMMUNITY)
Admission: AD | Admit: 2017-03-18 | Discharge: 2017-03-18 | Disposition: A | Discharge status: Home / Self Care | Payer: Medicaid Other | Source: Ambulatory Visit | Attending: Obstetrics and Gynecology | Admitting: Obstetrics and Gynecology

## 2017-03-18 DIAGNOSIS — O09891 Supervision of other high risk pregnancies, first trimester: Secondary | ICD-10-CM | POA: Insufficient documentation

## 2017-03-18 DIAGNOSIS — E669 Obesity, unspecified: Secondary | ICD-10-CM | POA: Insufficient documentation

## 2017-03-18 DIAGNOSIS — O99211 Obesity complicating pregnancy, first trimester: Secondary | ICD-10-CM | POA: Insufficient documentation

## 2017-03-18 DIAGNOSIS — Z880 Allergy status to penicillin: Secondary | ICD-10-CM | POA: Insufficient documentation

## 2017-03-18 DIAGNOSIS — Z87891 Personal history of nicotine dependence: Secondary | ICD-10-CM | POA: Diagnosis not present

## 2017-03-18 DIAGNOSIS — J101 Influenza due to other identified influenza virus with other respiratory manifestations: Secondary | ICD-10-CM | POA: Diagnosis not present

## 2017-03-18 DIAGNOSIS — J0110 Acute frontal sinusitis, unspecified: Secondary | ICD-10-CM | POA: Diagnosis not present

## 2017-03-18 DIAGNOSIS — R6883 Chills (without fever): Secondary | ICD-10-CM | POA: Insufficient documentation

## 2017-03-18 DIAGNOSIS — O98511 Other viral diseases complicating pregnancy, first trimester: Secondary | ICD-10-CM | POA: Insufficient documentation

## 2017-03-18 DIAGNOSIS — Z882 Allergy status to sulfonamides status: Secondary | ICD-10-CM | POA: Diagnosis not present

## 2017-03-18 DIAGNOSIS — O99511 Diseases of the respiratory system complicating pregnancy, first trimester: Secondary | ICD-10-CM | POA: Diagnosis not present

## 2017-03-18 DIAGNOSIS — Z3A11 11 weeks gestation of pregnancy: Secondary | ICD-10-CM | POA: Diagnosis not present

## 2017-03-18 DIAGNOSIS — O219 Vomiting of pregnancy, unspecified: Secondary | ICD-10-CM | POA: Insufficient documentation

## 2017-03-18 DIAGNOSIS — J45909 Unspecified asthma, uncomplicated: Secondary | ICD-10-CM | POA: Diagnosis not present

## 2017-03-18 DIAGNOSIS — R05 Cough: Secondary | ICD-10-CM | POA: Insufficient documentation

## 2017-03-18 DIAGNOSIS — J09X2 Influenza due to identified novel influenza A virus with other respiratory manifestations: Secondary | ICD-10-CM | POA: Diagnosis not present

## 2017-03-18 LAB — URINALYSIS, ROUTINE W REFLEX MICROSCOPIC
Bilirubin Urine: NEGATIVE
GLUCOSE, UA: NEGATIVE mg/dL
HGB URINE DIPSTICK: NEGATIVE
KETONES UR: 20 mg/dL — AB
Leukocytes, UA: NEGATIVE
NITRITE: NEGATIVE
PROTEIN: 30 mg/dL — AB
Specific Gravity, Urine: 1.029 (ref 1.005–1.030)
pH: 6 (ref 5.0–8.0)

## 2017-03-18 LAB — INFLUENZA PANEL BY PCR (TYPE A & B)
Influenza A By PCR: POSITIVE — AB
Influenza B By PCR: NEGATIVE

## 2017-03-18 LAB — RAPID STREP SCREEN (MED CTR MEBANE ONLY): Streptococcus, Group A Screen (Direct): NEGATIVE

## 2017-03-18 MED ORDER — ONDANSETRON HCL 4 MG/2ML IJ SOLN
4.0000 mg | Freq: Once | INTRAMUSCULAR | Status: AC
  Administered 2017-03-18: 4 mg via INTRAVENOUS
  Filled 2017-03-18: qty 2

## 2017-03-18 MED ORDER — CLINDAMYCIN HCL 150 MG PO CAPS: 150.0000 mg | ORAL_CAPSULE | Freq: Four times a day (QID) | ORAL | 0 refills | Status: DC

## 2017-03-18 MED ORDER — ALBUTEROL SULFATE HFA 108 (90 BASE) MCG/ACT IN AERS: 1.0000 | INHALATION_SPRAY | Freq: Four times a day (QID) | RESPIRATORY_TRACT | 0 refills | Status: DC | PRN

## 2017-03-18 MED ORDER — OSELTAMIVIR PHOSPHATE 75 MG PO CAPS: 75.0000 mg | ORAL_CAPSULE | Freq: Two times a day (BID) | ORAL | 0 refills | Status: DC

## 2017-03-18 MED ORDER — LACTATED RINGERS IV BOLUS (SEPSIS)
1000.0000 mL | Freq: Once | INTRAVENOUS | Status: AC
  Administered 2017-03-18: 1000 mL via INTRAVENOUS

## 2017-03-18 MED ORDER — CEFIXIME 400 MG PO CAPS: 400.0000 mg | ORAL_CAPSULE | Freq: Every day | ORAL | 0 refills | Status: DC

## 2017-03-18 MED ORDER — ONDANSETRON 4 MG PO TBDP: 4.0000 mg | ORAL_TABLET | Freq: Three times a day (TID) | ORAL | 0 refills | Status: DC | PRN

## 2017-03-18 MED ORDER — FAMOTIDINE IN NACL 20-0.9 MG/50ML-% IV SOLN
20.0000 mg | Freq: Once | INTRAVENOUS | Status: AC
  Administered 2017-03-18: 20 mg via INTRAVENOUS
  Filled 2017-03-18: qty 50

## 2017-03-18 MED ORDER — BENZONATATE 100 MG PO CAPS
200.0000 mg | ORAL_CAPSULE | Freq: Three times a day (TID) | ORAL | 0 refills | Status: DC | PRN
Start: 2017-03-18 — End: 2017-04-08

## 2017-03-18 NOTE — Discharge Instructions (Signed)
Cool Mist Vaporizer °A cool mist vaporizer is a device that releases a cool mist into the air. If you have a cough or a cold, using a vaporizer may help relieve your symptoms. The mist adds moisture to the air, which may help thin your mucus and make it less sticky. When your mucus is thin and less sticky, it easier for you to breathe and to cough up secretions. °Do not use a vaporizer if you are allergic to mold. °Follow these instructions at home: °· Follow the instructions that come with the vaporizer. °· Do not use anything other than distilled water in the vaporizer. °· Do not run the vaporizer all of the time. Doing that can cause mold or bacteria to grow in the vaporizer. °· Clean the vaporizer after each time that you use it. °· Clean and dry the vaporizer well before storing it. °· Stop using the vaporizer if your breathing symptoms get worse. °This information is not intended to replace advice given to you by your health care provider. Make sure you discuss any questions you have with your health care provider. °Document Released: 10/20/2003 Document Revised: 08/12/2015 Document Reviewed: 04/23/2015 °Elsevier Interactive Patient Education © 2018 [employer] ° °Influenza, Adult °Influenza (“the flu") is an infection in the lungs, nose, and throat (respiratory tract). It is caused by a virus. The flu causes many common cold symptoms, as well as a high fever and body aches. It can make you feel very sick. °The flu spreads easily from person to person (is contagious). Getting a flu shot (influenza vaccination) every year is the best way to prevent the flu. °Follow these instructions at home: °· Take over-the-counter and prescription medicines only as told by your doctor. °· Use a cool mist humidifier to add moisture (humidity) to the air in your home. This can make it easier to breathe. °· Rest as needed. °· Drink enough fluid to keep your pee (urine) clear or pale yellow. °· Cover your mouth and nose when  you cough or sneeze. °· Wash your hands with soap and water often, especially after you cough or sneeze. If you cannot use soap and water, use hand sanitizer. °· Stay home from work or school as told by your doctor. Unless you are visiting your doctor, try to avoid leaving home until your fever has been gone for 24 hours without the use of medicine. °· Keep all follow-up visits as told by your doctor. This is important. °How is this prevented? °· Getting a yearly (annual) flu shot is the best way to avoid getting the flu. You may get the flu shot in late summer, fall, or winter. Ask your doctor when you should get your flu shot. °· Wash your hands often or use hand sanitizer often. °· Avoid contact with people who are sick during cold and flu season. °· Eat healthy foods. °· Drink plenty of fluids. °· Get enough sleep. °· Exercise regularly. °Contact a doctor if: °· You get new symptoms. °· You have: °? Chest pain. °? Watery poop (diarrhea). °? A fever. °· Your cough gets worse. °· You start to have more mucus. °· You feel sick to your stomach (nauseous). °· You throw up (vomit). °Get help right away if: °· You start to be short of breath or have trouble breathing. °· Your skin or nails turn a bluish color. °· You have very bad pain or stiffness in your neck. °· You get a sudden headache. °· You get sudden pain   in your face or ear. °· You cannot stop throwing up. °This information is not intended to replace advice given to you by your health care provider. Make sure you discuss any questions you have with your health care provider. °Document Released: 11/01/2007 Document Revised: 06/30/2015 Document Reviewed: 11/16/2014 °Elsevier Interactive Patient Education © 2017 Elsevier Inc. ° °

## 2017-03-18 NOTE — MAU Note (Signed)
Pt reports cough, body aches, congestion, vomiting.

## 2017-03-18 NOTE — MAU Provider Note (Signed)
History     CSN: [id number]  Arrival date and time: 03/18/17 1427   First Provider Initiated Contact with Patient 03/18/17 1611      Chief Complaint  Patient presents with  . Emesis  . Cough  . Nasal Congestion   HPI   Ms.Tamara Garza is a 27 y.o. female G2P1001 @ [redacted]w[redacted]d here in MAU with complaints of sinus congestion, cough, chills, sore throat, and Nausea and vomiting. States she is unsure whether she has a fever. Feels feverish.  Symptoms started 3 weeks ago. She was seen at [facility] on 1/31 and was told she had the flu. States they gave her antibiotcs however they were hesitent to give her anything because she was in the first trimester of pregnancy. She did not take the Z pack that was given to her. States she wanted to be seen again because she does not feel she is getting any better.  + history of asthma, however has lost her inhaler.  PCN & Sulfa allergy. Reaction unknown.   OB History    Gravida Para Term Preterm AB Living   2 1 1  0 0 1   SAB TAB Ectopic Multiple Live Births   0 0 0 0 1      Past Medical History:  Diagnosis Date  . ACNE 04/04/2006   Qualifier: Diagnosis of  By: Abundio Miu    . Asthma   . ASTHMA, INTERMITTENT 04/04/2006   Qualifier: Diagnosis of  By: Lelon Perla MD, Vickki Muff    . Gallstones 2011  . 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 Infection    UTI  . LGSIL (low grade squamous intraepithelial dysplasia)   . Migraine 11/21/2011  . Obesity   . RHINITIS, ALLERGIC 04/04/2006   Qualifier: Diagnosis of  By: Abundio Miu    . Vaginal Pap smear, abnormal    ok since colpo    Past Surgical History:  Procedure Laterality Date  . CHOLECYSTECTOMY    . COLPOSCOPY  2009    Family History  Problem Relation Age of Onset  . Multiple sclerosis Mother   . Diabetes Father   . Hypertension Father   . Asthma Father   . Breast cancer Maternal Grandmother   . Cancer Maternal Grandmother   . Hypertension Maternal Grandmother   . Dementia  Maternal Grandfather     Social History   Tobacco Use  . Smoking status: Former Smoker    Packs/day: 0.25    Last attempt to quit: 2016    Years since quitting: 3.1  . Smokeless tobacco: Never Used  Substance Use Topics  . Alcohol use: No  . Drug use: No    Allergies:  Allergies  Allergen Reactions  . Eggs Or Egg-Derived Products Swelling  . Penicillins Rash    Has patient had a PCN reaction causing immediate rash, facial/tongue/throat swelling, SOB or lightheadedness with hypotension: unknown Has patient had a PCN reaction causing severe rash involving mucus membranes or skin necrosis: unknown Has patient had a PCN reaction that required hospitalization unknown Has patient had a PCN reaction occurring within the last 10 years: no If all of the above answers are "NO", then may proceed with Cephalosporin use.   . Sulfonamide Derivatives Rash    Medications Prior to Admission  Medication Sig Dispense Refill Last Dose  . acetaminophen (TYLENOL) 325 MG tablet Take 650 mg by mouth every 6 (six) hours as needed.   01/29/2017 at Unknown time  . azithromycin (ZITHROMAX) 250 MG  tablet Take 1 tablet (250 mg total) by mouth daily. Take first 2 tablets together, then 1 every day until finished. 6 tablet 0    Results for orders placed or performed during the hospital encounter of 03/18/17 (from the past 48 hour(s))  Urinalysis, Routine w reflex microscopic     Status: Abnormal   Collection Time: 03/18/17  2:40 PM  Result Value Ref Range   Color, Urine AMBER (A) YELLOW    Comment: BIOCHEMICALS MAY BE AFFECTED BY COLOR   APPearance CLOUDY (A) CLEAR   Specific Gravity, Urine 1.029 1.005 - 1.030   pH 6.0 5.0 - 8.0   Glucose, UA NEGATIVE NEGATIVE mg/dL   Hgb urine dipstick NEGATIVE NEGATIVE   Bilirubin Urine NEGATIVE NEGATIVE   Ketones, ur 20 (A) NEGATIVE mg/dL   Protein, ur 30 (A) NEGATIVE mg/dL   Nitrite NEGATIVE NEGATIVE   Leukocytes, UA NEGATIVE NEGATIVE   RBC / HPF 0-5 0 - 5  RBC/hpf   WBC, UA 0-5 0 - 5 WBC/hpf   Bacteria, UA RARE (A) NONE SEEN   Squamous Epithelial / LPF 6-30 (A) NONE SEEN   Mucus PRESENT     Comment: Performed at [facility], [address] [place], [place] [address]  Influenza panel by PCR (type A & B)     Status: Abnormal   Collection Time: 03/18/17  3:08 PM  Result Value Ref Range   Influenza A By PCR POSITIVE (A) NEGATIVE   Influenza B By PCR NEGATIVE NEGATIVE    Comment: (NOTE) The Xpert Xpress Flu assay is intended as an aid in the diagnosis of  influenza and should not be used as a sole basis for treatment.  This  assay is FDA approved for nasopharyngeal swab specimens only. Nasal  washings and aspirates are unacceptable for Xpert Xpress Flu testing. Performed at [facility], [address], [place], [place] [address]   Rapid Strep Screen (Not at [facility])     Status: None   Collection Time: 03/18/17  4:32 PM  Result Value Ref Range   Streptococcus, Group A Screen (Direct) NEGATIVE NEGATIVE    Comment: (NOTE) A Rapid Antigen test may result negative if the antigen level in the sample is below the detection level of this test. The FDA has not cleared this test as a stand-alone test therefore the rapid antigen negative result has reflexed to a Group A Strep culture.    Review of Systems  Constitutional: Positive for chills and fever.  HENT: Positive for congestion and sore throat.   Respiratory: Negative for shortness of breath and wheezing.   Cardiovascular: Negative for chest pain.   Physical Exam   Blood pressure 116/69, pulse (!) 114, temperature 99.3 F (37.4 C), temperature source Oral, resp. rate 17, height 5\' 5"  (1.651 m), weight 250 lb (113.4 kg), SpO2 99 %, not currently breastfeeding.  Physical Exam  Constitutional: She is oriented to person, place, and time. She appears well-developed and well-nourished.  Non-toxic appearance. She has a sickly appearance. She appears ill. No distress.  HENT:  Head:  Normocephalic.  Right Ear: No drainage. Tympanic membrane is erythematous. Tympanic membrane is not bulging. A middle ear effusion is present.  Left Ear: No drainage. Tympanic membrane is erythematous. Tympanic membrane is not bulging. A middle ear effusion is present.  Nose: Right sinus exhibits maxillary sinus tenderness and frontal sinus tenderness. Left sinus exhibits maxillary sinus tenderness and frontal sinus tenderness.  Mouth/Throat: Uvula is midline. Mucous membranes are dry. Oropharyngeal exudate  and posterior oropharyngeal erythema present. No posterior oropharyngeal edema or tonsillar abscesses.  + boggy, erythematous turbinates   Eyes: Pupils are equal, round, and reactive to light.  Cardiovascular: Normal heart sounds.  Respiratory: Effort normal and breath sounds normal. No accessory muscle usage. No tachypnea and no bradypnea. No respiratory distress.  GI: There is no tenderness.  Neurological: She is alert and oriented to person, place, and time.  Skin: Skin is warm. She is not diaphoretic.  Psychiatric: Her behavior is normal.   MAU Course  Procedures  None  MDM  Chest Xray done on 1/31: negative. Influenza A swab positive UA with signs of dehydration  Zofran given  LR bolus X 1 Strep swab collected Active fetus on bedside US  No vomiting noted in MAU   Assessment and Plan   A:  1. Influenza A   2. Acute non-recurrent frontal sinusitis   3. [redacted] weeks gestation of pregnancy     P:  Discharge home with strict return precautions Push oral fluids at home Rx: Tamiflu, Suprax & Clinda (patient has PCN allergy), Zofran, Tessalon perles, albuterol inhaler.  Return to MAU if symptoms worsen  BRAT diet Small, frequent meals Stay home. Wash hands frequently.    Duane Lope, NP 03/18/2017 8:12 PM

## 2017-03-19 ENCOUNTER — Encounter (HOSPITAL_COMMUNITY): Payer: Self-pay | Admitting: *Deleted

## 2017-03-19 ENCOUNTER — Inpatient Hospital Stay (HOSPITAL_COMMUNITY)
Admission: AD | Admit: 2017-03-19 | Discharge: 2017-03-19 | Disposition: A | Discharge status: Home / Self Care | Payer: Medicaid Other | Source: Ambulatory Visit | Attending: Family Medicine | Admitting: Family Medicine

## 2017-03-19 DIAGNOSIS — Z9049 Acquired absence of other specified parts of digestive tract: Secondary | ICD-10-CM | POA: Diagnosis not present

## 2017-03-19 DIAGNOSIS — Z833 Family history of diabetes mellitus: Secondary | ICD-10-CM | POA: Diagnosis not present

## 2017-03-19 DIAGNOSIS — Z9889 Other specified postprocedural states: Secondary | ICD-10-CM | POA: Diagnosis not present

## 2017-03-19 DIAGNOSIS — J111 Influenza due to unidentified influenza virus with other respiratory manifestations: Secondary | ICD-10-CM | POA: Insufficient documentation

## 2017-03-19 DIAGNOSIS — Z882 Allergy status to sulfonamides status: Secondary | ICD-10-CM | POA: Insufficient documentation

## 2017-03-19 DIAGNOSIS — Z91012 Allergy to eggs: Secondary | ICD-10-CM | POA: Insufficient documentation

## 2017-03-19 DIAGNOSIS — Z803 Family history of malignant neoplasm of breast: Secondary | ICD-10-CM | POA: Diagnosis not present

## 2017-03-19 DIAGNOSIS — Z820 Family history of epilepsy and other diseases of the nervous system: Secondary | ICD-10-CM | POA: Diagnosis not present

## 2017-03-19 DIAGNOSIS — Z8249 Family history of ischemic heart disease and other diseases of the circulatory system: Secondary | ICD-10-CM | POA: Insufficient documentation

## 2017-03-19 DIAGNOSIS — O99211 Obesity complicating pregnancy, first trimester: Secondary | ICD-10-CM | POA: Insufficient documentation

## 2017-03-19 DIAGNOSIS — Z87891 Personal history of nicotine dependence: Secondary | ICD-10-CM | POA: Insufficient documentation

## 2017-03-19 DIAGNOSIS — J45909 Unspecified asthma, uncomplicated: Secondary | ICD-10-CM | POA: Diagnosis not present

## 2017-03-19 DIAGNOSIS — Z79899 Other long term (current) drug therapy: Secondary | ICD-10-CM | POA: Diagnosis not present

## 2017-03-19 DIAGNOSIS — Z880 Allergy status to penicillin: Secondary | ICD-10-CM | POA: Insufficient documentation

## 2017-03-19 DIAGNOSIS — Z3A11 11 weeks gestation of pregnancy: Secondary | ICD-10-CM | POA: Diagnosis not present

## 2017-03-19 DIAGNOSIS — O99511 Diseases of the respiratory system complicating pregnancy, first trimester: Secondary | ICD-10-CM | POA: Diagnosis present

## 2017-03-19 DIAGNOSIS — O9989 Other specified diseases and conditions complicating pregnancy, childbirth and the puerperium: Secondary | ICD-10-CM

## 2017-03-19 DIAGNOSIS — O219 Vomiting of pregnancy, unspecified: Secondary | ICD-10-CM | POA: Diagnosis not present

## 2017-03-19 MED ORDER — BUTALBITAL-APAP-CAFFEINE 50-325-40 MG PO TABS: 2.0000 | ORAL_TABLET | Freq: Four times a day (QID) | ORAL | 0 refills | Status: DC | PRN

## 2017-03-19 MED ORDER — PROMETHAZINE HCL 25 MG PO TABS: 25.0000 mg | ORAL_TABLET | Freq: Four times a day (QID) | ORAL | 0 refills | Status: DC | PRN

## 2017-03-19 NOTE — MAU Note (Addendum)
PT SAYS  SHE WAS HERE YESTERDAY -  TESTED POSITIVE  FOR FLU A .  SAYS SHE WENT  TO [facility] WEEKS AGO - POSITIVE FLU A.      HAS TAKEN  ALL HER MEDS    .   IS STILL  VOMITING   AT HOME  ,  HAS COUGH,  CHILLS,  NO DIARRHEA.

## 2017-03-19 NOTE — MAU Provider Note (Signed)
History     CSN: [id number]  Arrival date and time: 03/19/17 2027   None     Chief Complaint  Patient presents with  . Emesis   27 yo G2P1001 at [redacted]w[redacted]d presents with the flu. Diagnosed yesterday, given tamiflu and zofran. She has been swallowing zofran instead of letting it dissolve but says it is not working well. She endorses a headache. She is able to keep food down and take her medications.     Past Medical History:  Diagnosis Date  . ACNE 04/04/2006   Qualifier: Diagnosis of  By: Abundio Miu    . Asthma   . ASTHMA, INTERMITTENT 04/04/2006   Qualifier: Diagnosis of  By: Lelon Perla MD, Vickki Muff    . Gallstones 2011  . 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 Infection    UTI  . LGSIL (low grade squamous intraepithelial dysplasia)   . Migraine 11/21/2011  . Obesity   . RHINITIS, ALLERGIC 04/04/2006   Qualifier: Diagnosis of  By: Abundio Miu    . Vaginal Pap smear, abnormal    ok since colpo    Past Surgical History:  Procedure Laterality Date  . CHOLECYSTECTOMY    . COLPOSCOPY  2009    Family History  Problem Relation Age of Onset  . Multiple sclerosis Mother   . Diabetes Father   . Hypertension Father   . Asthma Father   . Breast cancer Maternal Grandmother   . Cancer Maternal Grandmother   . Hypertension Maternal Grandmother   . Dementia Maternal Grandfather     Social History   Tobacco Use  . Smoking status: Former Smoker    Packs/day: 0.25    Last attempt to quit: 2016    Years since quitting: 3.1  . Smokeless tobacco: Never Used  Substance Use Topics  . Alcohol use: No  . Drug use: No    Allergies:  Allergies  Allergen Reactions  . Eggs Or Egg-Derived Products Swelling  . Penicillins Rash    Has patient had a PCN reaction causing immediate rash, facial/tongue/throat swelling, SOB or lightheadedness with hypotension: unknown Has patient had a PCN reaction causing severe rash involving mucus membranes or skin necrosis: unknown Has patient  had a PCN reaction that required hospitalization unknown Has patient had a PCN reaction occurring within the last 10 years: no If all of the above answers are "NO", then may proceed with Cephalosporin use.   . Sulfonamide Derivatives Rash    Medications Prior to Admission  Medication Sig Dispense Refill Last Dose  . acetaminophen (TYLENOL) 325 MG tablet Take 650 mg by mouth every 6 (six) hours as needed.   03/19/2017 at Unknown time  . benzonatate (TESSALON) 100 MG capsule Take 2 capsules (200 mg total) by mouth every 8 (eight) hours as needed for cough. 21 capsule 0 03/18/2017 at Unknown time  . Cefixime (SUPRAX) 400 MG CAPS capsule Take 1 capsule (400 mg total) by mouth daily. 5 capsule 0 03/19/2017 at Unknown time  . clindamycin (CLEOCIN) 150 MG capsule Take 1 capsule (150 mg total) by mouth every 6 (six) hours. 20 capsule 0 03/19/2017 at Unknown time  . ondansetron (ZOFRAN ODT) 4 MG disintegrating tablet Take 1 tablet (4 mg total) by mouth every 8 (eight) hours as needed for nausea or vomiting. 20 tablet 0 03/19/2017 at Unknown time  . oseltamivir (TAMIFLU) 75 MG capsule Take 1 capsule (75 mg total) by mouth every 12 (twelve) hours. 10 capsule 0 03/19/2017 at Unknown time  .  albuterol (PROVENTIL HFA;VENTOLIN HFA) 108 (90 Base) MCG/ACT inhaler Inhale 1-2 puffs into the lungs every 6 (six) hours as needed for wheezing or shortness of breath. 1 Inhaler 0 More than a month at Unknown time    Review of Systems  Constitutional: Positive for activity change and fatigue.  HENT: Negative for congestion and dental problem.   Eyes: Negative for discharge and itching.  Respiratory: Negative for apnea and chest tightness.   Cardiovascular: Negative for chest pain and leg swelling.  Gastrointestinal: Negative for abdominal distention and abdominal pain.  Endocrine: Negative for cold intolerance and heat intolerance.  Genitourinary: Negative for difficulty urinating and dysuria.  Musculoskeletal: Negative  for arthralgias and back pain.  Neurological: Positive for headaches. Negative for dizziness and light-headedness.  Hematological: Negative for adenopathy. Does not bruise/bleed easily.   Physical Exam   Blood pressure 123/79, pulse (!) 117, temperature 98 F (36.7 C), temperature source Oral, height 5\' 5"  (1.651 m), weight 113.3 kg (249 lb 12 oz), not currently breastfeeding.  Physical Exam  Constitutional: She is oriented to person, place, and time. She appears well-developed and well-nourished. No distress.  HENT:  Head: Atraumatic.  Eyes: Conjunctivae are normal. Pupils are equal, round, and reactive to light.  Neck: Normal range of motion. Neck supple.  Cardiovascular: Intact distal pulses.  Respiratory: Effort normal. No respiratory distress.  GI: Soft. She exhibits no distension.  Musculoskeletal: Normal range of motion. She exhibits no edema.  Neurological: She is alert and oriented to person, place, and time.  Skin: Skin is warm and dry.  Psychiatric: She has a normal mood and affect. Her behavior is normal.    MAU Course  Procedures  MDM Patient is well appearing. Will add phenergan for nausea and fioricet for headache. Advised supportive care and continue tamiflu.  Assessment and Plan  1. Flu 2. Pregnancy with 11 weeks completed gestation  [employer] 03/19/2017, 9:37 PM

## 2017-03-19 NOTE — Discharge Instructions (Signed)

## 2017-03-19 NOTE — MAU Note (Signed)
Bedside US performed FHR seen. Performed by Dr. Rachelle Hora.

## 2017-03-21 LAB — CULTURE, GROUP A STREP (THRC)

## 2017-04-08 ENCOUNTER — Encounter: Payer: Self-pay | Admitting: Family Medicine

## 2017-04-08 ENCOUNTER — Encounter: Payer: Self-pay | Admitting: *Deleted

## 2017-04-08 ENCOUNTER — Ambulatory Visit (INDEPENDENT_AMBULATORY_CARE_PROVIDER_SITE_OTHER): Payer: Medicaid Other | Admitting: Family Medicine

## 2017-04-08 VITALS — BP 127/80 | HR 105 | Wt 250.0 lb

## 2017-04-08 DIAGNOSIS — Z3482 Encounter for supervision of other normal pregnancy, second trimester: Secondary | ICD-10-CM | POA: Diagnosis not present

## 2017-04-08 DIAGNOSIS — Z3480 Encounter for supervision of other normal pregnancy, unspecified trimester: Secondary | ICD-10-CM | POA: Insufficient documentation

## 2017-04-08 DIAGNOSIS — R87612 Low grade squamous intraepithelial lesion on cytologic smear of cervix (LGSIL): Secondary | ICD-10-CM

## 2017-04-08 LAB — POCT URINALYSIS DIP (DEVICE)
Bilirubin Urine: NEGATIVE
Glucose, UA: NEGATIVE mg/dL
HGB URINE DIPSTICK: NEGATIVE
KETONES UR: 40 mg/dL — AB
Nitrite: NEGATIVE
Protein, ur: NEGATIVE mg/dL
UROBILINOGEN UA: 0.2 mg/dL (ref 0.0–1.0)
pH: 6 (ref 5.0–8.0)

## 2017-04-08 NOTE — Progress Notes (Signed)
Subjective:  Tamara Garza is a G2P1001 [redacted]w[redacted]d being seen today for her first obstetrical visit.  Her obstetrical history is significant for obesity. This was not a planned pregnancy. FOB involved. Patient does intend to breast feed. Pregnancy history fully reviewed.  Patient reports nausea and vomiting.  PHQ9 elevated. Declines to see Jaime  BP 127/80   Pulse (!) 105   Wt 250 lb (113.4 kg)   LMP  (LMP Unknown)   BMI 41.60 kg/m   HISTORY: OB History  Gravida Para Term Preterm AB Living  2 1 1  0 0 1  SAB TAB Ectopic Multiple Live Births  0 0 0 0 1    # Outcome Date GA Lbr Len/2nd Weight Sex Delivery Anes PTL Lv  2 Current           1 Term 03/30/16 [redacted]w[redacted]d 59:17 / 00:12 6 lb 6 oz (2.892 kg) M Vag-Spont EPI  LIV      Past Medical History:  Diagnosis Date  . ACNE 04/04/2006   Qualifier: Diagnosis of  By: Abundio Miu    . Asthma   . ASTHMA, INTERMITTENT 04/04/2006   Qualifier: Diagnosis of  By: Lelon Perla MD, Vickki Muff    . Gallstones 2011  . 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 Infection    UTI  . LGSIL (low grade squamous intraepithelial dysplasia)   . Migraine 11/21/2011  . Obesity   . RHINITIS, ALLERGIC 04/04/2006   Qualifier: Diagnosis of  By: Abundio Miu    . Vaginal Pap smear, abnormal    ok since colpo    Past Surgical History:  Procedure Laterality Date  . CHOLECYSTECTOMY    . COLPOSCOPY  2009    Family History  Problem Relation Age of Onset  . Multiple sclerosis Mother   . Diabetes Father   . Hypertension Father   . Asthma Father   . Breast cancer Maternal Grandmother   . Cancer Maternal Grandmother   . Hypertension Maternal Grandmother   . Dementia Maternal Grandfather      Exam    Uterus:     Pelvic Exam: Deferred today  System: Breast:  Deferred by pt   Skin: normal coloration and turgor, no rashes    Neurologic: gait normal; reflexes normal and symmetric   Extremities: normal strength, tone, and muscle mass   HEENT PERRLA and extra  ocular movement intact   Mouth/Teeth mucous membranes moist, pharynx normal without lesions   Neck supple and no masses   Cardiovascular: regular rate and rhythm, no murmurs or gallops   Respiratory:  appears well, vitals normal, no respiratory distress, acyanotic, normal RR, ear and throat exam is normal, neck free of mass or lymphadenopathy, chest clear, no wheezing, crepitations, rhonchi, normal symmetric air entry   Abdomen: soft, non-tender; bowel sounds normal; no masses,  no organomegaly      Assessment:    Pregnancy: G2P1001 Patient Active Problem List   Diagnosis Date Noted  . Supervision of other normal pregnancy, antepartum 04/08/2017  . Thumb pain, left 05/02/2016  . Former smoker 01/23/2016  . Migraine 11/21/2011  . Back pain 11/21/2011  . 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 Left breast mass 11/21/2011  . LGSIL on Pap smear of cervix   . 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 Obesity 04/04/2006  . Hearing loss 04/04/2006  . RHINITIS, ALLERGIC 04/04/2006  . ASTHMA, INTERMITTENT 04/04/2006      Plan:   1. Supervision of other normal pregnancy, antepartum Genetic  Screening discussed:NIPS requested  Ultrasound discussed; fetal survey: ordered. PNL  Follow up in 4 weeks.   2. LGSIL on Pap smear of cervix Needs PAP - deferred by pt until next visit     Problem list reviewed and updated. 75% of 30 min visit spent on counseling and coordination of care.    Levie Heritage 04/08/2017

## 2017-04-09 LAB — OBSTETRIC PANEL, INCLUDING HIV
Antibody Screen: NEGATIVE
Basophils Absolute: 0 10*3/uL (ref 0.0–0.2)
Basos: 0 %
EOS (ABSOLUTE): 0.1 10*3/uL (ref 0.0–0.4)
Eos: 2 %
HIV Screen 4th Generation wRfx: NONREACTIVE
Hematocrit: 35.1 % (ref 34.0–46.6)
Hemoglobin: 11.3 g/dL (ref 11.1–15.9)
Hepatitis B Surface Ag: NEGATIVE
Immature Grans (Abs): 0 10*3/uL (ref 0.0–0.1)
Immature Granulocytes: 0 %
Lymphocytes Absolute: 1.9 10*3/uL (ref 0.7–3.1)
Lymphs: 25 %
MCH: 28.2 pg (ref 26.6–33.0)
MCHC: 32.2 g/dL (ref 31.5–35.7)
MCV: 88 fL (ref 79–97)
Monocytes Absolute: 0.6 10*3/uL (ref 0.1–0.9)
Monocytes: 9 %
Neutrophils Absolute: 4.7 10*3/uL (ref 1.4–7.0)
Neutrophils: 64 %
Platelets: 272 10*3/uL (ref 150–379)
RBC: 4.01 x10E6/uL (ref 3.77–5.28)
RDW: 15.1 % (ref 12.3–15.4)
RPR Ser Ql: NONREACTIVE
Rh Factor: POSITIVE
Rubella Antibodies, IGG: 2.05 index (ref >0.99)
WBC: 7.4 10*3/uL (ref 3.4–10.8)

## 2017-04-09 LAB — HEMOGLOBIN A1C
ESTIMATED AVERAGE GLUCOSE: 108 mg/dL
Hgb A1c MFr Bld: 5.4 % (ref 4.8–5.6)

## 2017-04-10 ENCOUNTER — Other Ambulatory Visit: Payer: Self-pay | Admitting: General Practice

## 2017-04-10 ENCOUNTER — Encounter: Payer: Self-pay | Admitting: Family Medicine

## 2017-04-10 ENCOUNTER — Telehealth: Payer: Self-pay | Admitting: *Deleted

## 2017-04-10 DIAGNOSIS — Z3A15 15 weeks gestation of pregnancy: Secondary | ICD-10-CM

## 2017-04-10 LAB — URINE CULTURE, OB REFLEX

## 2017-04-10 LAB — CULTURE, OB URINE

## 2017-04-10 MED ORDER — PRENATAL 27-1 MG PO TABS
1.0000 | ORAL_TABLET | Freq: Every day | ORAL | 2 refills | Status: DC
Start: 2017-04-10 — End: 2017-05-06

## 2017-04-10 NOTE — Telephone Encounter (Signed)
Idil left a voice message 04/09/17 stating she needs proof we drew blood for her iron to be faxed.

## 2017-04-12 NOTE — Telephone Encounter (Signed)
Renai called back and call transferred to nurse. I confirmed that we had checked her iron. She asked that it be faxed to [facility]. I informed her she will need to sign a release in our office in order to have it faxed.  She asked for results, I gave her results of hct/ hgb , rpr, urine culture. She voices understanding.

## 2017-04-12 NOTE — Telephone Encounter (Signed)
I called Tamara Garza back and left a message I am returning her call; please call us back if you still have a need.

## 2017-04-30 ENCOUNTER — Encounter (HOSPITAL_COMMUNITY): Payer: Self-pay | Admitting: Family Medicine

## 2017-05-06 ENCOUNTER — Ambulatory Visit (INDEPENDENT_AMBULATORY_CARE_PROVIDER_SITE_OTHER): Payer: Medicaid Other | Admitting: Family Medicine

## 2017-05-06 ENCOUNTER — Ambulatory Visit (HOSPITAL_COMMUNITY)
Admission: RE | Admit: 2017-05-06 | Discharge: 2017-05-06 | Disposition: A | Discharge status: Home / Self Care | Payer: Medicaid Other | Source: Ambulatory Visit | Attending: Family Medicine | Admitting: Family Medicine

## 2017-05-06 ENCOUNTER — Other Ambulatory Visit: Payer: Self-pay | Admitting: Family Medicine

## 2017-05-06 ENCOUNTER — Other Ambulatory Visit (HOSPITAL_COMMUNITY)
Admission: RE | Admit: 2017-05-06 | Discharge: 2017-05-06 | Disposition: A | Discharge status: Home / Self Care | Payer: Medicaid Other | Source: Ambulatory Visit | Attending: Family Medicine | Admitting: Family Medicine

## 2017-05-06 VITALS — BP 113/72 | HR 99 | Wt 251.0 lb

## 2017-05-06 DIAGNOSIS — Z3A18 18 weeks gestation of pregnancy: Secondary | ICD-10-CM | POA: Diagnosis not present

## 2017-05-06 DIAGNOSIS — Z3689 Encounter for other specified antenatal screening: Secondary | ICD-10-CM | POA: Diagnosis not present

## 2017-05-06 DIAGNOSIS — Z3482 Encounter for supervision of other normal pregnancy, second trimester: Secondary | ICD-10-CM

## 2017-05-06 DIAGNOSIS — Z3480 Encounter for supervision of other normal pregnancy, unspecified trimester: Secondary | ICD-10-CM | POA: Diagnosis present

## 2017-05-06 DIAGNOSIS — Z6841 Body Mass Index (BMI) 40.0 and over, adult: Secondary | ICD-10-CM

## 2017-05-06 DIAGNOSIS — O99212 Obesity complicating pregnancy, second trimester: Secondary | ICD-10-CM

## 2017-05-06 DIAGNOSIS — R87612 Low grade squamous intraepithelial lesion on cytologic smear of cervix (LGSIL): Secondary | ICD-10-CM

## 2017-05-06 NOTE — Progress Notes (Signed)
   PRENATAL VISIT NOTE  Subjective:  Tamara Garza is a 27 y.o. G2P1001 at [redacted]w[redacted]d being seen today for ongoing prenatal care.  She is currently monitored for the following issues for this high-risk pregnancy and has Obesity; Hearing loss; RHINITIS, ALLERGIC; ASTHMA, INTERMITTENT; LGSIL on Pap smear of cervix; High risk HPV infection; Migraine; Back pain; Carpal tunnel syndrome of left wrist; Left breast mass; Former smoker; Thumb pain, left; and Supervision of other normal pregnancy, antepartum on their problem list.  Patient reports no complaints.  Contractions: Not present. Vag. Bleeding: None.  Movement: Absent. Denies leaking of fluid.   The following portions of the patient's history were reviewed and updated as appropriate: allergies, current medications, past family history, past medical history, past social history, past surgical history and problem list. Problem list updated.  Objective:   Vitals:   05/06/17 1122  BP: 113/72  Pulse: 99  Weight: 251 lb (113.9 kg)    Fetal Status: Fetal Heart Rate (bpm): 149   Movement: Absent     General:  Alert, oriented and cooperative. Patient is in no acute distress.  Skin: Skin is warm and dry. No rash noted.   Cardiovascular: Normal heart rate noted  Respiratory: Normal respiratory effort, no problems with respiration noted  Abdomen: Soft, gravid, appropriate for gestational age.  Pain/Pressure: Absent     Pelvic: PAP done today. Normal external genitalia, vaginal mucosa, cervix.        Extremities: Normal range of motion.  Edema: None  Mental Status: Normal mood and affect. Normal behavior. Normal judgment and thought content.   Assessment and Plan:  Pregnancy: G2P1001 at [redacted]w[redacted]d  1. Supervision of other normal pregnancy, antepartum FHT and FH normal - Cytology - PAP  2. LGSIL on Pap smear of cervix PAP today  3. Class 3 severe obesity due to excess calories without serious comorbidity with body mass index (BMI) of 40.0 to 44.9 in  adult [facility])    Preterm labor symptoms and general obstetric precautions including but not limited to vaginal bleeding, contractions, leaking of fluid and fetal movement were reviewed in detail with the patient. Please refer to After Visit Summary for other counseling recommendations.  Return in about 1 month (around 06/03/2017).  Future Appointments  Date Time Provider Department Center  05/06/2017  3:15 PM [facility] [place] 4 [facility]  06/05/2017  1:15 PM Levie Heritage, DO [facility] None    Levie Heritage, DO

## 2017-05-07 DIAGNOSIS — O99212 Obesity complicating pregnancy, second trimester: Secondary | ICD-10-CM | POA: Insufficient documentation

## 2017-05-07 DIAGNOSIS — Z3A18 18 weeks gestation of pregnancy: Secondary | ICD-10-CM | POA: Insufficient documentation

## 2017-05-07 LAB — CYTOLOGY - PAP
Chlamydia: NEGATIVE
DIAGNOSIS: NEGATIVE
Neisseria Gonorrhea: NEGATIVE

## 2017-05-12 ENCOUNTER — Encounter: Payer: Self-pay | Admitting: Family Medicine

## 2017-05-13 ENCOUNTER — Encounter: Payer: Self-pay | Admitting: Family Medicine

## 2017-05-13 ENCOUNTER — Telehealth: Payer: Self-pay | Admitting: General Practice

## 2017-05-13 DIAGNOSIS — M5430 Sciatica, unspecified side: Secondary | ICD-10-CM

## 2017-05-13 MED ORDER — CYCLOBENZAPRINE HCL 5 MG PO TABS: 5.0000 mg | ORAL_TABLET | Freq: Three times a day (TID) | ORAL | 0 refills | Status: DC | PRN

## 2017-05-13 NOTE — Telephone Encounter (Signed)
Patient called and left message on nurse line asking what can she do for sciatica. Patient states she has tried resting, tylenol, heating pad, etc but nothing is working. Per Dr Adrian Blackwater, may send in Rx for flexeril. Called and informed patient. Patient verbalized understanding and asked what manipulation is because he mentioned it in his message. Told patient it is slightly similar to what chiropractors do but he can explain that to her more at her next visit. Patient states so I am going to have to wait until May. Told patient she can contact the [facility] office for a sooner appt. Patient verbalized understanding & had no questions

## 2017-05-14 NOTE — Telephone Encounter (Signed)
This patient has back pain. Can you try to get her in this Thursday because I'm not available at all next week?   Thanks!

## 2017-05-16 ENCOUNTER — Ambulatory Visit (INDEPENDENT_AMBULATORY_CARE_PROVIDER_SITE_OTHER): Payer: Medicaid Other | Admitting: Family Medicine

## 2017-05-16 ENCOUNTER — Encounter: Payer: Self-pay | Admitting: Family Medicine

## 2017-05-16 VITALS — BP 115/71 | HR 85 | Wt 254.0 lb

## 2017-05-16 DIAGNOSIS — R87612 Low grade squamous intraepithelial lesion on cytologic smear of cervix (LGSIL): Secondary | ICD-10-CM | POA: Diagnosis not present

## 2017-05-16 DIAGNOSIS — M9903 Segmental and somatic dysfunction of lumbar region: Secondary | ICD-10-CM | POA: Diagnosis not present

## 2017-05-16 DIAGNOSIS — G8929 Other chronic pain: Secondary | ICD-10-CM

## 2017-05-16 DIAGNOSIS — M5440 Lumbago with sciatica, unspecified side: Secondary | ICD-10-CM | POA: Diagnosis not present

## 2017-05-16 DIAGNOSIS — O99212 Obesity complicating pregnancy, second trimester: Secondary | ICD-10-CM | POA: Diagnosis not present

## 2017-05-16 DIAGNOSIS — Z3482 Encounter for supervision of other normal pregnancy, second trimester: Secondary | ICD-10-CM

## 2017-05-16 DIAGNOSIS — Z3480 Encounter for supervision of other normal pregnancy, unspecified trimester: Secondary | ICD-10-CM

## 2017-05-16 MED ORDER — PRENATAL VITAMINS 0.8 MG PO TABS: 1.0000 | ORAL_TABLET | Freq: Every day | ORAL | 12 refills | Status: DC

## 2017-05-16 NOTE — Progress Notes (Signed)
   PRENATAL VISIT NOTE  Subjective:  Dache Malmgren is a 27 y.o. G2P1001 at [redacted]w[redacted]d being seen today for ongoing prenatal care.  She is currently monitored for the following issues for this low-risk pregnancy and has Obesity; Hearing loss; RHINITIS, ALLERGIC; ASTHMA, INTERMITTENT; Encounter for fetal anatomic survey; LGSIL on Pap smear of cervix; High risk HPV infection; Migraine; Back pain; Carpal tunnel syndrome of left wrist; Left breast mass; Former smoker; Thumb pain, left; Supervision of other normal pregnancy, antepartum; [redacted] weeks gestation of pregnancy; and Obesity affecting pregnancy in second trimester on their problem list.  Patient reports left lumbar back pain, radiation down left leg to thigh.  Contractions: Not present. Vag. Bleeding: None.  Movement: Absent. Denies leaking of fluid.   The following portions of the patient's history were reviewed and updated as appropriate: allergies, current medications, past family history, past medical history, past social history, past surgical history and problem list. Problem list updated.  Objective:   Vitals:   05/16/17 1052  BP: 115/71  Pulse: 85  Weight: 254 lb (115.2 kg)    Fetal Status: Fetal Heart Rate (bpm): 150   Movement: Absent     General:  Alert, oriented and cooperative. Patient is in no acute distress.  Skin: Skin is warm and dry. No rash noted.   Cardiovascular: Normal heart rate noted  Respiratory: Normal respiratory effort, no problems with respiration noted  Abdomen: Soft, gravid, appropriate for gestational age. Pain/Pressure: Present     Pelvic:  Cervical exam deferred        MSK: Restriction, tenderness, tissue texture changes, and paraspinal spasm in the left lumbar spine  Neuro: Moves all four extremities with no focal neurological deficit  Extremities: Normal range of motion.  Edema: None  Mental Status: Normal mood and affect. Normal behavior. Normal judgment and thought content.   OSE: Head   Cervical     Thoracic   Rib   Lumbar L5 ESRL  Sacrum L/L  Pelvis Right ant    Assessment and Plan:  Pregnancy: G2P1001 at [redacted]w[redacted]d  1. Supervision of other normal pregnancy, antepartum FHT normal  2. Obesity affecting pregnancy in second trimester  3. Chronic left-sided low back pain with sciatica, sciatica laterality unspecified  4. LGSIL on Pap smear of cervix PAP normal. Repeat PAP in 1 year  5. Somatic dysfunction of spine, lumbar OMT done after patient permission. HVLA technique utilized. 3 areas treated with improvement of tissue texture and joint mobility. Patient tolerated procedure well.     Preterm labor symptoms and general obstetric precautions including but not limited to vaginal bleeding, contractions, leaking of fluid and fetal movement were reviewed in detail with the patient. Please refer to After Visit Summary for other counseling recommendations.  No follow-ups on file.  Levie Heritage, DO

## 2017-05-28 ENCOUNTER — Encounter: Payer: Self-pay | Admitting: Family Medicine

## 2017-05-29 ENCOUNTER — Other Ambulatory Visit: Payer: Self-pay

## 2017-05-29 MED ORDER — PRENATE MINI 18-0.6-0.4-350 MG PO CAPS: 1.0000 | ORAL_CAPSULE | Freq: Every day | ORAL | 12 refills | Status: DC

## 2017-05-29 NOTE — Telephone Encounter (Signed)
Patient requested prenatal vitamin. Tamara Marchiano RN 

## 2017-06-04 ENCOUNTER — Encounter: Payer: Self-pay | Admitting: *Deleted

## 2017-06-05 ENCOUNTER — Encounter: Payer: Self-pay | Admitting: Family Medicine

## 2017-06-05 ENCOUNTER — Ambulatory Visit (INDEPENDENT_AMBULATORY_CARE_PROVIDER_SITE_OTHER): Payer: Medicaid Other | Admitting: Family Medicine

## 2017-06-05 VITALS — BP 113/76 | HR 98 | Wt 251.0 lb

## 2017-06-05 DIAGNOSIS — Z3480 Encounter for supervision of other normal pregnancy, unspecified trimester: Secondary | ICD-10-CM

## 2017-06-05 DIAGNOSIS — O99212 Obesity complicating pregnancy, second trimester: Secondary | ICD-10-CM

## 2017-06-05 NOTE — Progress Notes (Signed)
   PRENATAL VISIT NOTE  Subjective:  Tamara Garza is a 27 y.o. G2P1001 at [redacted]w[redacted]d being seen today for ongoing prenatal care.  She is currently monitored for the following issues for this low-risk pregnancy and has Obesity; Hearing loss; RHINITIS, ALLERGIC; ASTHMA, INTERMITTENT; Encounter for fetal anatomic survey; LGSIL on Pap smear of cervix; High risk HPV infection; Migraine; Back pain; Carpal tunnel syndrome of left wrist; Left breast mass; Former smoker; Thumb pain, left; Supervision of other normal pregnancy, antepartum; [redacted] weeks gestation of pregnancy; and Obesity affecting pregnancy in second trimester on their problem list.  Patient reports no complaints.  Contractions: Not present. Vag. Bleeding: None.  Movement: Present. Denies leaking of fluid.   The following portions of the patient's history were reviewed and updated as appropriate: allergies, current medications, past family history, past medical history, past social history, past surgical history and problem list. Problem list updated.  Objective:   Vitals:   06/05/17 1431  BP: 113/76  Pulse: 98  Weight: 251 lb (113.9 kg)    Fetal Status: Fetal Heart Rate (bpm): 155   Movement: Present     General:  Alert, oriented and cooperative. Patient is in no acute distress.  Skin: Skin is warm and dry. No rash noted.   Cardiovascular: Normal heart rate noted  Respiratory: Normal respiratory effort, no problems with respiration noted  Abdomen: Soft, gravid, appropriate for gestational age.  Pain/Pressure: Present     Pelvic: Cervical exam deferred        Extremities: Normal range of motion.  Edema: None  Mental Status: Normal mood and affect. Normal behavior. Normal judgment and thought content.   Assessment and Plan:  Pregnancy: G2P1001 at [redacted]w[redacted]d  1. Supervision of other normal pregnancy, antepartum FHT and FH normal. Discussed PP BTL. Patient still wants to think about it. Will look at signing papers next visit. - [place] MFM OB  FOLLOW UP; Future  2. Obesity affecting pregnancy in second trimester Wt gain normal  Preterm labor symptoms and general obstetric precautions including but not limited to vaginal bleeding, contractions, leaking of fluid and fetal movement were reviewed in detail with the patient. Please refer to After Visit Summary for other counseling recommendations.  Return in about 1 month (around 07/03/2017) for OB f/u.  No future appointments.  Levie Heritage, DO

## 2017-06-21 ENCOUNTER — Ambulatory Visit (HOSPITAL_COMMUNITY)
Admission: RE | Admit: 2017-06-21 | Discharge: 2017-06-21 | Disposition: A | Discharge status: Home / Self Care | Payer: Medicaid Other | Source: Ambulatory Visit | Attending: Family Medicine | Admitting: Family Medicine

## 2017-06-21 ENCOUNTER — Other Ambulatory Visit: Payer: Self-pay | Admitting: Family Medicine

## 2017-06-21 DIAGNOSIS — IMO0002 Reserved for concepts with insufficient information to code with codable children: Secondary | ICD-10-CM

## 2017-06-21 DIAGNOSIS — Z0489 Encounter for examination and observation for other specified reasons: Secondary | ICD-10-CM

## 2017-06-21 DIAGNOSIS — Z3A24 24 weeks gestation of pregnancy: Secondary | ICD-10-CM | POA: Insufficient documentation

## 2017-06-21 DIAGNOSIS — Z362 Encounter for other antenatal screening follow-up: Secondary | ICD-10-CM | POA: Diagnosis present

## 2017-06-21 DIAGNOSIS — O99212 Obesity complicating pregnancy, second trimester: Secondary | ICD-10-CM | POA: Insufficient documentation

## 2017-06-21 DIAGNOSIS — E669 Obesity, unspecified: Secondary | ICD-10-CM | POA: Diagnosis not present

## 2017-06-21 DIAGNOSIS — Z3480 Encounter for supervision of other normal pregnancy, unspecified trimester: Secondary | ICD-10-CM

## 2017-07-03 ENCOUNTER — Telehealth: Payer: Self-pay

## 2017-07-03 DIAGNOSIS — O99210 Obesity complicating pregnancy, unspecified trimester: Secondary | ICD-10-CM

## 2017-07-03 NOTE — Telephone Encounter (Signed)
Patient called to reschedule her appointment.  Patient also concerned about follow up ultrasound for growth to be scheduled in six weeks from her last one. She read on the report due to obesity and was wondering if that is in regards to her weight or the baby's weight. I let her know that it is her weight they are referencing. Patient would like for ultrasound to be scheduled. Will place order. Armandina Stammer RN

## 2017-07-09 ENCOUNTER — Encounter: Payer: Self-pay | Admitting: Advanced Practice Midwife

## 2017-07-17 ENCOUNTER — Ambulatory Visit (INDEPENDENT_AMBULATORY_CARE_PROVIDER_SITE_OTHER): Payer: Medicaid Other | Admitting: Family Medicine

## 2017-07-17 DIAGNOSIS — Z3483 Encounter for supervision of other normal pregnancy, third trimester: Secondary | ICD-10-CM

## 2017-07-17 DIAGNOSIS — Z3480 Encounter for supervision of other normal pregnancy, unspecified trimester: Secondary | ICD-10-CM

## 2017-07-17 NOTE — Progress Notes (Signed)
Pt prefers a 1 hr GTT.

## 2017-07-17 NOTE — Patient Instructions (Signed)

## 2017-07-17 NOTE — Progress Notes (Signed)
   PRENATAL VISIT NOTE  Subjective:  Tamara Garza is a 27 y.o. G2P1001 at [redacted]w[redacted]d being seen today for ongoing prenatal care.  She is currently monitored for the following issues for this low-risk pregnancy and has Obesity; Hearing loss; RHINITIS, ALLERGIC; ASTHMA, INTERMITTENT; Encounter for fetal anatomic survey; LGSIL on Pap smear of cervix; High risk HPV infection; Migraine; Back pain; Carpal tunnel syndrome of left wrist; Left breast mass; Former smoker; Thumb pain, left; Supervision of other normal pregnancy, antepartum; [redacted] weeks gestation of pregnancy; Obesity affecting pregnancy in second trimester; Evaluate anatomy not seen on prior sonogram; and [redacted] weeks gestation of pregnancy on their problem list.  Patient reports no complaints.  Contractions: Not present. Vag. Bleeding: None.  Movement: Present. Denies leaking of fluid.   The following portions of the patient's history were reviewed and updated as appropriate: allergies, current medications, past family history, past medical history, past social history, past surgical history and problem list. Problem list updated.  Objective:   Vitals:   07/17/17 1330  BP: 113/62  Pulse: (!) 102  Weight: 258 lb (117 kg)    Fetal Status: Fetal Heart Rate (bpm): 143 Fundal Height: 30 cm Movement: Present     General:  Alert, oriented and cooperative. Patient is in no acute distress.  Skin: Skin is warm and dry. No rash noted.   Cardiovascular: Normal heart rate noted  Respiratory: Normal respiratory effort, no problems with respiration noted  Abdomen: Soft, gravid, appropriate for gestational age.  Pain/Pressure: Present     Pelvic: Cervical exam deferred        Extremities: Normal range of motion.  Edema: None  Mental Status: Normal mood and affect. Normal behavior. Normal judgment and thought content.   Assessment and Plan:  Pregnancy: G2P1001 at [redacted]w[redacted]d  1. Supervision of other normal pregnancy, antepartum 28 wk labs--has needle phobia  so will do 1 hour - CBC - RPR - HIV antibody (with reflex) - Glucose tolerance, 1 hour  Preterm labor symptoms and general obstetric precautions including but not limited to vaginal bleeding, contractions, leaking of fluid and fetal movement were reviewed in detail with the patient. Please refer to After Visit Summary for other counseling recommendations.  Return in 2 weeks (on 07/31/2017).  Future Appointments  Date Time Provider Department Center  08/01/2017 11:00 AM Levie Heritage, DO [facility] None  08/02/2017 11:00 AM [facility] [place] 3 [facility]    Reva Bores, MD

## 2017-07-18 ENCOUNTER — Encounter: Payer: Self-pay | Admitting: Family Medicine

## 2017-07-18 LAB — CBC
HEMATOCRIT: 33.4 % — AB (ref 34.0–46.6)
HEMOGLOBIN: 11 g/dL — AB (ref 11.1–15.9)
MCH: 30.1 pg (ref 26.6–33.0)
MCHC: 32.9 g/dL (ref 31.5–35.7)
MCV: 92 fL (ref 79–97)
Platelets: 198 10*3/uL (ref 150–450)
RBC: 3.65 x10E6/uL — ABNORMAL LOW (ref 3.77–5.28)
RDW: 14.1 % (ref 12.3–15.4)
WBC: 7.7 10*3/uL (ref 3.4–10.8)

## 2017-07-18 LAB — RPR: RPR Ser Ql: NONREACTIVE

## 2017-07-18 LAB — GLUCOSE TOLERANCE, 1 HOUR: Glucose, 1Hr PP: 104 mg/dL (ref 65–199)

## 2017-07-18 LAB — HIV ANTIBODY (ROUTINE TESTING W REFLEX): HIV Screen 4th Generation wRfx: NONREACTIVE

## 2017-08-01 ENCOUNTER — Ambulatory Visit (INDEPENDENT_AMBULATORY_CARE_PROVIDER_SITE_OTHER): Payer: Medicaid Other | Admitting: Family Medicine

## 2017-08-01 VITALS — BP 110/75 | HR 126 | Wt 254.8 lb

## 2017-08-01 DIAGNOSIS — O99210 Obesity complicating pregnancy, unspecified trimester: Secondary | ICD-10-CM

## 2017-08-01 DIAGNOSIS — Z3480 Encounter for supervision of other normal pregnancy, unspecified trimester: Secondary | ICD-10-CM

## 2017-08-01 NOTE — Progress Notes (Signed)
   PRENATAL VISIT NOTE  Subjective:  Tamara Garza is a 27 y.o. G2P1001 at [redacted]w[redacted]d being seen today for ongoing prenatal care.  She is currently monitored for the following issues for this low-risk pregnancy and has Obesity; Hearing loss; RHINITIS, ALLERGIC; ASTHMA, INTERMITTENT; Encounter for fetal anatomic survey; LGSIL on Pap smear of cervix; High risk HPV infection; Migraine; Back pain; Carpal tunnel syndrome of left wrist; Left breast mass; Former smoker; Thumb pain, left; Supervision of other normal pregnancy, antepartum; and Obesity affecting pregnancy in second trimester on their problem list.  Patient reports no complaints.  Contractions: Irritability. Vag. Bleeding: None.  Movement: Present. Denies leaking of fluid.   The following portions of the patient's history were reviewed and updated as appropriate: allergies, current medications, past family history, past medical history, past social history, past surgical history and problem list. Problem list updated.  Objective:   Vitals:   08/01/17 1034  BP: 110/75  Pulse: (!) 126  Weight: 254 lb 12.8 oz (115.6 kg)    Fetal Status: Fetal Heart Rate (bpm): 143 Fundal Height: 32 cm Movement: Present     General:  Alert, oriented and cooperative. Patient is in no acute distress.  Skin: Skin is warm and dry. No rash noted.   Cardiovascular: Normal heart rate noted  Respiratory: Normal respiratory effort, no problems with respiration noted  Abdomen: Soft, gravid, appropriate for gestational age.  Pain/Pressure: Present     Pelvic: Cervical exam deferred        Extremities: Normal range of motion.  Edema: None  Mental Status: Normal mood and affect. Normal behavior. Normal judgment and thought content.   Assessment and Plan:  Pregnancy: G2P1001 at [redacted]w[redacted]d  1. Supervision of other normal pregnancy, antepartum FHT and FH normal.  2. Obesity in pregnancy   Preterm labor symptoms and general obstetric precautions including but not  limited to vaginal bleeding, contractions, leaking of fluid and fetal movement were reviewed in detail with the patient. Please refer to After Visit Summary for other counseling recommendations.  Return in about 2 weeks (around 08/15/2017).  Future Appointments  Date Time Provider Department Center  08/02/2017 11:00 AM [facility] [place] 3 [facility]  08/16/2017 11:00 AM Levie Heritage, DO [facility] None    Levie Heritage, DO

## 2017-08-02 ENCOUNTER — Ambulatory Visit (HOSPITAL_COMMUNITY)
Admission: RE | Admit: 2017-08-02 | Discharge: 2017-08-02 | Disposition: A | Discharge status: Home / Self Care | Payer: Medicaid Other | Source: Ambulatory Visit | Attending: Family Medicine | Admitting: Family Medicine

## 2017-08-02 DIAGNOSIS — O99213 Obesity complicating pregnancy, third trimester: Secondary | ICD-10-CM | POA: Insufficient documentation

## 2017-08-02 DIAGNOSIS — Z362 Encounter for other antenatal screening follow-up: Secondary | ICD-10-CM | POA: Diagnosis not present

## 2017-08-02 DIAGNOSIS — O99210 Obesity complicating pregnancy, unspecified trimester: Secondary | ICD-10-CM

## 2017-08-02 DIAGNOSIS — Z3A33 33 weeks gestation of pregnancy: Secondary | ICD-10-CM | POA: Insufficient documentation

## 2017-08-15 ENCOUNTER — Ambulatory Visit (INDEPENDENT_AMBULATORY_CARE_PROVIDER_SITE_OTHER): Payer: Medicaid Other | Admitting: Family Medicine

## 2017-08-15 VITALS — BP 113/76 | HR 105 | Wt 264.4 lb

## 2017-08-15 DIAGNOSIS — O368130 Decreased fetal movements, third trimester, not applicable or unspecified: Secondary | ICD-10-CM | POA: Diagnosis not present

## 2017-08-15 DIAGNOSIS — Z3483 Encounter for supervision of other normal pregnancy, third trimester: Secondary | ICD-10-CM

## 2017-08-15 DIAGNOSIS — Z3480 Encounter for supervision of other normal pregnancy, unspecified trimester: Secondary | ICD-10-CM

## 2017-08-15 DIAGNOSIS — E669 Obesity, unspecified: Secondary | ICD-10-CM

## 2017-08-15 DIAGNOSIS — O99213 Obesity complicating pregnancy, third trimester: Secondary | ICD-10-CM

## 2017-08-15 DIAGNOSIS — O99210 Obesity complicating pregnancy, unspecified trimester: Secondary | ICD-10-CM

## 2017-08-15 NOTE — Progress Notes (Signed)
   PRENATAL VISIT NOTE  Subjective:  Tamara Garza is a 27 y.o. G2P1001 at [redacted]w[redacted]d being seen today for ongoing prenatal care.  She is currently monitored for the following issues for this low-risk pregnancy and has Obesity; Hearing loss; RHINITIS, ALLERGIC; ASTHMA, INTERMITTENT; Encounter for fetal anatomic survey; LGSIL on Pap smear of cervix; High risk HPV infection; Migraine; Back pain; Carpal tunnel syndrome of left wrist; Left breast mass; Former smoker; Thumb pain, left; Supervision of other normal pregnancy, antepartum; and Obesity affecting pregnancy in second trimester on their problem list.  Patient reports no complaints.  Contractions: Not present. Vag. Bleeding: None.  Movement: (!) Decreased. Denies leaking of fluid.   The following portions of the patient's history were reviewed and updated as appropriate: allergies, current medications, past family history, past medical history, past social history, past surgical history and problem list. Problem list updated.  Objective:   Vitals:   08/15/17 1105  BP: 113/76  Pulse: (!) 105  Weight: 264 lb 6.4 oz (119.9 kg)    Fetal Status: Fetal Heart Rate (bpm): 147 Fundal Height: 34 cm Movement: (!) Decreased     General:  Alert, oriented and cooperative. Patient is in no acute distress.  Skin: Skin is warm and dry. No rash noted.   Cardiovascular: Normal heart rate noted  Respiratory: Normal respiratory effort, no problems with respiration noted  Abdomen: Soft, gravid, appropriate for gestational age.  Pain/Pressure: Present     Pelvic: Cervical exam deferred        Extremities: Normal range of motion.  Edema: Trace  Mental Status: Normal mood and affect. Normal behavior. Normal judgment and thought content.   Assessment and Plan:  Pregnancy: G2P1001 at [redacted]w[redacted]d  1. Supervision of other normal pregnancy, antepartum FHT and FH normal  2. Obesity in pregnancy  3. Decreased fetal movements in third trimester, single or unspecified  fetus NST reactive  Preterm labor symptoms and general obstetric precautions including but not limited to vaginal bleeding, contractions, leaking of fluid and fetal movement were reviewed in detail with the patient. Please refer to After Visit Summary for other counseling recommendations.  Return in about 2 weeks (around 08/29/2017) for OB f/u.  No future appointments.  Levie Heritage, DO

## 2017-08-16 ENCOUNTER — Encounter: Payer: Self-pay | Admitting: Family Medicine

## 2017-08-30 ENCOUNTER — Encounter: Payer: Self-pay | Admitting: Obstetrics and Gynecology

## 2017-09-04 ENCOUNTER — Encounter: Payer: Self-pay | Admitting: Obstetrics & Gynecology

## 2017-09-13 ENCOUNTER — Institutional Professional Consult (permissible substitution): Payer: Self-pay | Admitting: Physician Assistant

## 2017-09-20 ENCOUNTER — Encounter: Payer: Self-pay | Admitting: Family Medicine

## 2017-09-26 ENCOUNTER — Ambulatory Visit (INDEPENDENT_AMBULATORY_CARE_PROVIDER_SITE_OTHER): Payer: Medicaid Other | Admitting: Obstetrics & Gynecology

## 2017-09-26 ENCOUNTER — Other Ambulatory Visit (HOSPITAL_COMMUNITY)
Admission: RE | Admit: 2017-09-26 | Discharge: 2017-09-26 | Disposition: A | Discharge status: Home / Self Care | Payer: Medicaid Other | Source: Ambulatory Visit | Attending: Obstetrics & Gynecology | Admitting: Obstetrics & Gynecology

## 2017-09-26 VITALS — BP 114/74 | HR 110 | Wt 268.1 lb

## 2017-09-26 DIAGNOSIS — Z3490 Encounter for supervision of normal pregnancy, unspecified, unspecified trimester: Secondary | ICD-10-CM | POA: Insufficient documentation

## 2017-09-26 DIAGNOSIS — O99212 Obesity complicating pregnancy, second trimester: Secondary | ICD-10-CM

## 2017-09-26 DIAGNOSIS — O26849 Uterine size-date discrepancy, unspecified trimester: Secondary | ICD-10-CM

## 2017-09-26 DIAGNOSIS — R87612 Low grade squamous intraepithelial lesion on cytologic smear of cervix (LGSIL): Secondary | ICD-10-CM

## 2017-09-26 DIAGNOSIS — Z3480 Encounter for supervision of other normal pregnancy, unspecified trimester: Secondary | ICD-10-CM

## 2017-09-26 DIAGNOSIS — O99519 Diseases of the respiratory system complicating pregnancy, unspecified trimester: Secondary | ICD-10-CM

## 2017-09-26 DIAGNOSIS — J45909 Unspecified asthma, uncomplicated: Secondary | ICD-10-CM

## 2017-09-26 NOTE — Progress Notes (Signed)
   PRENATAL VISIT NOTE  Subjective:  Tamara Garza is a 27 y.o. G2P1001 at [redacted]w[redacted]d being seen today for ongoing prenatal care.  She is currently monitored for the following issues for this high-risk pregnancy and has Obesity; Hearing loss; RHINITIS, ALLERGIC; Asthma affecting pregnancy, antepartum; LGSIL on Pap smear of cervix; Migraine; Carpal tunnel syndrome of left wrist; Left breast mass; Former smoker; Thumb pain, left; Supervision of other normal pregnancy, antepartum; and Obesity affecting pregnancy in second trimester on their problem list.  Patient reports feels like baby is super large.  .  Contractions: Irregular. Vag. Bleeding: Scant.  Movement: Present. Denies leaking of fluid.   The following portions of the patient's history were reviewed and updated as appropriate: allergies, current medications, past family history, past medical history, past social history, past surgical history and problem list. Problem list updated.  Objective:   Vitals:   09/26/17 0912  BP: 114/74  Pulse: (!) 110  Weight: 268 lb 1.3 oz (121.6 kg)    Fetal Status:     Movement: Present     General:  Alert, oriented and cooperative. Patient is in no acute distress.  Skin: Skin is warm and dry. No rash noted.   Cardiovascular: Normal heart rate noted  Respiratory: Normal respiratory effort, no problems with respiration noted  Abdomen: Soft, gravid, appropriate for gestational age.  Pain/Pressure: Present     Pelvic: Cervical exam performed        Extremities: Normal range of motion.  Edema: Trace  Mental Status: Normal mood and affect. Normal behavior. Normal judgment and thought content.   Assessment and Plan:  Pregnancy: G2P1001 at [redacted]w[redacted]d  1. Prenatal care, antepartum  - GC/Chlamydia probe amp ([place])not at [facility] - Culture, beta strep (group b only)  2. Supervision of other normal pregnancy, antepartum S>>D Will obtain US to obtain growth measurements    3. Obesity affecting pregnancy  in second trimester   4. LGSIL on Pap smear of cervix  5. Asthma affecting pregnancy, antepartum stable  Term labor symptoms and general obstetric precautions including but not limited to vaginal bleeding, contractions, leaking of fluid and fetal movement were reviewed in detail with the patient. Please refer to After Visit Summary for other counseling recommendations.  Return in about 1 week (around 10/03/2017).  Future Appointments  Date Time Provider Department Center  10/04/2017  9:45 AM Willodean Rosenthal, MD [facility] None    Willodean Rosenthal, MD

## 2017-09-27 ENCOUNTER — Encounter (HOSPITAL_COMMUNITY): Payer: Self-pay

## 2017-09-27 ENCOUNTER — Encounter: Payer: Self-pay | Admitting: Obstetrics & Gynecology

## 2017-09-27 ENCOUNTER — Other Ambulatory Visit: Payer: Self-pay

## 2017-09-27 ENCOUNTER — Inpatient Hospital Stay (HOSPITAL_COMMUNITY): Payer: Medicaid Other | Admitting: Anesthesiology

## 2017-09-27 ENCOUNTER — Inpatient Hospital Stay (HOSPITAL_COMMUNITY)
Admission: AD | Admit: 2017-09-27 | Discharge: 2017-09-29 | DRG: 807 | Disposition: A | Discharge status: Home / Self Care | Payer: Medicaid Other | Attending: Obstetrics & Gynecology | Admitting: Obstetrics & Gynecology

## 2017-09-27 DIAGNOSIS — O99214 Obesity complicating childbirth: Secondary | ICD-10-CM | POA: Diagnosis present

## 2017-09-27 DIAGNOSIS — D649 Anemia, unspecified: Secondary | ICD-10-CM | POA: Diagnosis present

## 2017-09-27 DIAGNOSIS — Z3483 Encounter for supervision of other normal pregnancy, third trimester: Secondary | ICD-10-CM | POA: Diagnosis present

## 2017-09-27 DIAGNOSIS — O9952 Diseases of the respiratory system complicating childbirth: Secondary | ICD-10-CM | POA: Diagnosis present

## 2017-09-27 DIAGNOSIS — O9902 Anemia complicating childbirth: Secondary | ICD-10-CM | POA: Diagnosis present

## 2017-09-27 DIAGNOSIS — O26849 Uterine size-date discrepancy, unspecified trimester: Secondary | ICD-10-CM

## 2017-09-27 DIAGNOSIS — Z87891 Personal history of nicotine dependence: Secondary | ICD-10-CM

## 2017-09-27 DIAGNOSIS — J4520 Mild intermittent asthma, uncomplicated: Secondary | ICD-10-CM | POA: Diagnosis present

## 2017-09-27 DIAGNOSIS — Z880 Allergy status to penicillin: Secondary | ICD-10-CM | POA: Diagnosis not present

## 2017-09-27 DIAGNOSIS — Z3A38 38 weeks gestation of pregnancy: Secondary | ICD-10-CM

## 2017-09-27 LAB — TYPE AND SCREEN
ABO/RH(D): O POS
ANTIBODY SCREEN: NEGATIVE

## 2017-09-27 LAB — CBC
HCT: 33.9 % — ABNORMAL LOW (ref 36.0–46.0)
HEMOGLOBIN: 11 g/dL — AB (ref 12.0–15.0)
MCH: 28.9 pg (ref 26.0–34.0)
MCHC: 32.4 g/dL (ref 30.0–36.0)
MCV: 89 fL (ref 78.0–100.0)
Platelets: 173 10*3/uL (ref 150–400)
RBC: 3.81 MIL/uL — AB (ref 3.87–5.11)
RDW: 14.1 % (ref 11.5–15.5)
WBC: 8.1 10*3/uL (ref 4.0–10.5)

## 2017-09-27 LAB — GROUP B STREP BY PCR: GROUP B STREP BY PCR: POSITIVE — AB

## 2017-09-27 LAB — RPR: RPR: NONREACTIVE

## 2017-09-27 LAB — GC/CHLAMYDIA PROBE AMP (~~LOC~~) NOT AT ARMC
CHLAMYDIA, DNA PROBE: NEGATIVE
NEISSERIA GONORRHEA: NEGATIVE

## 2017-09-27 MED ORDER — OXYTOCIN BOLUS FROM INFUSION
500.0000 mL | Freq: Once | INTRAVENOUS | Status: AC
  Administered 2017-09-27: 500 mL via INTRAVENOUS

## 2017-09-27 MED ORDER — ACETAMINOPHEN 325 MG PO TABS: 650.0000 mg | ORAL_TABLET | ORAL | Status: DC | PRN

## 2017-09-27 MED ORDER — DIPHENHYDRAMINE HCL 25 MG PO CAPS: 25.0000 mg | ORAL_CAPSULE | Freq: Four times a day (QID) | ORAL | Status: DC | PRN

## 2017-09-27 MED ORDER — SIMETHICONE 80 MG PO CHEW: 80.0000 mg | CHEWABLE_TABLET | ORAL | Status: DC | PRN

## 2017-09-27 MED ORDER — OXYCODONE-ACETAMINOPHEN 5-325 MG PO TABS: 1.0000 | ORAL_TABLET | ORAL | Status: DC | PRN

## 2017-09-27 MED ORDER — FENTANYL 2.5 MCG/ML BUPIVACAINE 1/10 % EPIDURAL INFUSION (WH - ANES)
14.0000 mL/h | INTRAMUSCULAR | Status: DC | PRN
  Administered 2017-09-27 (×2): 14 mL/h via EPIDURAL
  Filled 2017-09-27: qty 100

## 2017-09-27 MED ORDER — ZOLPIDEM TARTRATE 5 MG PO TABS: 5.0000 mg | ORAL_TABLET | Freq: Every evening | ORAL | Status: DC | PRN

## 2017-09-27 MED ORDER — LIDOCAINE HCL (PF) 1 % IJ SOLN
INTRAMUSCULAR | Status: DC | PRN
  Administered 2017-09-27 (×2): 4 mL via EPIDURAL

## 2017-09-27 MED ORDER — PHENYLEPHRINE 40 MCG/ML (10ML) SYRINGE FOR IV PUSH (FOR BLOOD PRESSURE SUPPORT)
80.0000 ug | PREFILLED_SYRINGE | INTRAVENOUS | Status: DC | PRN
  Filled 2017-09-27: qty 5

## 2017-09-27 MED ORDER — FENTANYL 2.5 MCG/ML BUPIVACAINE 1/10 % EPIDURAL INFUSION (WH - ANES)
INTRAMUSCULAR | Status: AC
  Filled 2017-09-27: qty 100

## 2017-09-27 MED ORDER — ONDANSETRON HCL 4 MG/2ML IJ SOLN: 4.0000 mg | INTRAMUSCULAR | Status: DC | PRN

## 2017-09-27 MED ORDER — ONDANSETRON HCL 4 MG/2ML IJ SOLN
4.0000 mg | Freq: Four times a day (QID) | INTRAMUSCULAR | Status: DC | PRN
  Administered 2017-09-27: 4 mg via INTRAVENOUS
  Filled 2017-09-27: qty 2

## 2017-09-27 MED ORDER — DIPHENHYDRAMINE HCL 50 MG/ML IJ SOLN: 12.5000 mg | INTRAMUSCULAR | Status: DC | PRN

## 2017-09-27 MED ORDER — IBUPROFEN 600 MG PO TABS
600.0000 mg | ORAL_TABLET | Freq: Four times a day (QID) | ORAL | Status: DC
  Administered 2017-09-27 – 2017-09-29 (×8): 600 mg via ORAL
  Filled 2017-09-27 (×8): qty 1

## 2017-09-27 MED ORDER — ACETAMINOPHEN 325 MG PO TABS
650.0000 mg | ORAL_TABLET | ORAL | Status: DC | PRN
  Administered 2017-09-28 (×2): 650 mg via ORAL
  Filled 2017-09-27 (×2): qty 2

## 2017-09-27 MED ORDER — LACTATED RINGERS IV SOLN
INTRAVENOUS | Status: DC
  Administered 2017-09-27 (×2): via INTRAVENOUS

## 2017-09-27 MED ORDER — LACTATED RINGERS IV SOLN
500.0000 mL | Freq: Once | INTRAVENOUS | Status: AC
  Administered 2017-09-27: 500 mL via INTRAVENOUS

## 2017-09-27 MED ORDER — FLEET ENEMA 7-19 GM/118ML RE ENEM: 1.0000 | ENEMA | RECTAL | Status: DC | PRN

## 2017-09-27 MED ORDER — SENNOSIDES-DOCUSATE SODIUM 8.6-50 MG PO TABS
2.0000 | ORAL_TABLET | ORAL | Status: DC
  Administered 2017-09-27 – 2017-09-29 (×2): 2 via ORAL
  Filled 2017-09-27 (×2): qty 2

## 2017-09-27 MED ORDER — OXYCODONE-ACETAMINOPHEN 5-325 MG PO TABS: 2.0000 | ORAL_TABLET | ORAL | Status: DC | PRN

## 2017-09-27 MED ORDER — FENTANYL CITRATE (PF) 100 MCG/2ML IJ SOLN
50.0000 ug | INTRAMUSCULAR | Status: DC | PRN
  Administered 2017-09-27: 50 ug via INTRAVENOUS
  Filled 2017-09-27: qty 2

## 2017-09-27 MED ORDER — LIDOCAINE HCL (PF) 1 % IJ SOLN
30.0000 mL | INTRAMUSCULAR | Status: AC | PRN
  Administered 2017-09-27: 30 mL via SUBCUTANEOUS
  Filled 2017-09-27: qty 30

## 2017-09-27 MED ORDER — LACTATED RINGERS IV SOLN: 500.0000 mL | Freq: Once | INTRAVENOUS | Status: DC

## 2017-09-27 MED ORDER — CEFAZOLIN SODIUM-DEXTROSE 1-4 GM/50ML-% IV SOLN
1.0000 g | Freq: Three times a day (TID) | INTRAVENOUS | Status: DC
  Filled 2017-09-27 (×2): qty 50

## 2017-09-27 MED ORDER — PHENYLEPHRINE 40 MCG/ML (10ML) SYRINGE FOR IV PUSH (FOR BLOOD PRESSURE SUPPORT)
PREFILLED_SYRINGE | INTRAVENOUS | Status: AC
  Filled 2017-09-27: qty 10

## 2017-09-27 MED ORDER — EPHEDRINE 5 MG/ML INJ
10.0000 mg | INTRAVENOUS | Status: DC | PRN
  Filled 2017-09-27: qty 2

## 2017-09-27 MED ORDER — CLINDAMYCIN PHOSPHATE 900 MG/50ML IV SOLN
900.0000 mg | Freq: Once | INTRAVENOUS | Status: AC
  Administered 2017-09-27: 900 mg via INTRAVENOUS
  Filled 2017-09-27: qty 50

## 2017-09-27 MED ORDER — BENZOCAINE-MENTHOL 20-0.5 % EX AERO
1.0000 "application " | INHALATION_SPRAY | CUTANEOUS | Status: DC | PRN
  Filled 2017-09-27: qty 56

## 2017-09-27 MED ORDER — ONDANSETRON HCL 4 MG PO TABS: 4.0000 mg | ORAL_TABLET | ORAL | Status: DC | PRN

## 2017-09-27 MED ORDER — CEFAZOLIN SODIUM-DEXTROSE 2-4 GM/100ML-% IV SOLN
2.0000 g | Freq: Once | INTRAVENOUS | Status: AC
  Administered 2017-09-27: 2 g via INTRAVENOUS
  Filled 2017-09-27: qty 100

## 2017-09-27 MED ORDER — MEASLES, MUMPS & RUBELLA VAC ~~LOC~~ INJ: 0.5000 mL | INJECTION | Freq: Once | SUBCUTANEOUS | Status: DC

## 2017-09-27 MED ORDER — BENZOCAINE-MENTHOL 20-0.5 % EX AERO
1.0000 "application " | INHALATION_SPRAY | CUTANEOUS | Status: DC | PRN
  Administered 2017-09-27: 1 via TOPICAL

## 2017-09-27 MED ORDER — DIBUCAINE 1 % RE OINT: 1.0000 "application " | TOPICAL_OINTMENT | RECTAL | Status: DC | PRN

## 2017-09-27 MED ORDER — PRENATAL MULTIVITAMIN CH
1.0000 | ORAL_TABLET | Freq: Every day | ORAL | Status: DC
  Administered 2017-09-28 – 2017-09-29 (×2): 1 via ORAL
  Filled 2017-09-27 (×2): qty 1

## 2017-09-27 MED ORDER — LACTATED RINGERS IV SOLN
500.0000 mL | INTRAVENOUS | Status: DC | PRN
  Administered 2017-09-27: 500 mL via INTRAVENOUS

## 2017-09-27 MED ORDER — COCONUT OIL OIL: 1.0000 "application " | TOPICAL_OIL | Status: DC | PRN

## 2017-09-27 MED ORDER — TETANUS-DIPHTH-ACELL PERTUSSIS 5-2.5-18.5 LF-MCG/0.5 IM SUSP: 0.5000 mL | Freq: Once | INTRAMUSCULAR | Status: DC

## 2017-09-27 MED ORDER — SOD CITRATE-CITRIC ACID 500-334 MG/5ML PO SOLN: 30.0000 mL | ORAL | Status: DC | PRN

## 2017-09-27 MED ORDER — OXYCODONE HCL 5 MG PO TABS
5.0000 mg | ORAL_TABLET | Freq: Once | ORAL | Status: AC
  Administered 2017-09-27: 5 mg via ORAL
  Filled 2017-09-27: qty 1

## 2017-09-27 MED ORDER — WITCH HAZEL-GLYCERIN EX PADS: 1.0000 "application " | MEDICATED_PAD | CUTANEOUS | Status: DC | PRN

## 2017-09-27 MED ORDER — OXYTOCIN 40 UNITS IN LACTATED RINGERS INFUSION - SIMPLE MED
2.5000 [IU]/h | INTRAVENOUS | Status: DC
  Filled 2017-09-27: qty 1000

## 2017-09-27 MED ORDER — ALBUTEROL SULFATE (2.5 MG/3ML) 0.083% IN NEBU: 2.5000 mg | INHALATION_SOLUTION | RESPIRATORY_TRACT | Status: DC | PRN

## 2017-09-27 NOTE — Lactation Note (Signed)
This note was copied from a baby's chart. Lactation Consultation Note  Patient Name: Tamara Garza [id number] Date: 09/27/2017 Reason for consult: Mother's request;Follow-up assessment P2, 5 hour old female infant. Mom w/ close spaced pregnancies. Per mom, attempted BF her 64 month old son but stopped after a few days due to difficulties with latching him to the breast. Mom and dad both have been doing STS. LC discussed importance of STS to mom/ baby. Mom was taught hand expression by LC and colostrum is present. Mom latched infant to left breast  in football hold position, mom tickled top lip w/ breast, infant open mouth with wide gape, LC lower lower jaw. Audible swallowing heard by [name] and mom. Infant was still  feeding (10 mins.) as LC left the room. Mom demonstrated  and taught back hand expression, breast massage and breast compressions.  Mom is feeling confident in her abilities to BF her baby.  LC discussed I&O w/ parents. Mom will feed according hunger cues, 8 to 12 times within 24 hours including nights. Reviewed Baby & Me book's Breastfeeding Basics.  Mom made aware of O/P services, breastfeeding support groups, community resources, and our phone # for post-discharge questions.  Maternal Data Formula Feeding for Exclusion: No Has patient been taught Hand Expression?: Yes(Mom was taught hand expression by [facility]) Does the patient have breastfeeding experience prior to this delivery?: Yes(attmepted BF her 44 month old only few days difficulties w/ latching baby at breast.)  Feeding Feeding Type: Breast Fed Length of feed: 10 min(mom still BF as LC left room)  LATCH Score Latch: Grasps breast easily, tongue down, lips flanged, rhythmical sucking.  Audible Swallowing: Spontaneous and intermittent  Type of Nipple: Everted at rest and after stimulation  Comfort (Breast/Nipple): Soft / non-tender  Hold (Positioning): Assistance needed to correctly position infant at breast and  maintain latch.  LATCH Score: 9  Interventions Interventions: Breast feeding basics reviewed;Assisted with latch;Skin to skin;Adjust position;Breast compression;Breast massage;Support pillows;Hand express  Lactation Tools Discussed/Used WIC Program: Yes   Consult Status Consult Status: Follow-up Date: 09/28/17    Danelle Earthly 09/27/2017, 8:56 PM

## 2017-09-27 NOTE — Progress Notes (Signed)
Tamara Garza is 27 y.o. G57P1001 female at [redacted]w[redacted]d who presented for SOL. +GBS status on Ancef (mild PCN allergy). Patient with minimal cervical change today. Ctx q2-4 min with Cat I FHT. Offered AROM, pitocin for augmentation. Patient declined. During cervical check, patient with SROM with clear fluids.   Dilation: 9.5 Effacement (%): 90 Station: -1 Presentation: Vertex Exam by:: Lorn Junes, RN   Patient not feeling any pelvic pressure. Will continue to labor down. Anticipate NSVD.   [name], D.O. OB Fellow  09/27/2017, 2:10 PM

## 2017-09-27 NOTE — Anesthesia Preprocedure Evaluation (Signed)
Anesthesia Evaluation    Airway Mallampati: III  TM Distance: >3 FB Neck ROM: Full    Dental no notable dental hx. (+) Teeth Intact   Pulmonary asthma , former smoker,    Pulmonary exam normal breath sounds clear to auscultation       Cardiovascular negative cardio ROS Normal cardiovascular exam Rhythm:Regular Rate:Normal     Neuro/Psych  Headaches, PSYCHIATRIC DISORDERS Depression  Neuromuscular disease    GI/Hepatic Neg liver ROS, GERD  ,  Endo/Other  Morbid obesity  Renal/GU negative Renal ROS  negative genitourinary   Musculoskeletal negative musculoskeletal ROS (+)   Abdominal (+) + obese,   Peds  Hematology  (+) anemia ,   Anesthesia Other Findings   Reproductive/Obstetrics (+) Pregnancy                             Anesthesia Physical Anesthesia Plan  ASA: III  Anesthesia Plan: Epidural   Post-op Pain Management:    Induction:   PONV Risk Score and Plan:   Airway Management Planned: Natural Airway  Additional Equipment:   Intra-op Plan:   Post-operative Plan:   Informed Consent: I have reviewed the patients History and Physical, chart, labs and discussed the procedure including the risks, benefits and alternatives for the proposed anesthesia with the patient or authorized representative who has indicated his/her understanding and acceptance.     Plan Discussed with: Anesthesiologist  Anesthesia Plan Comments:         Anesthesia Quick Evaluation

## 2017-09-27 NOTE — H&P (Addendum)
OBSTETRIC ADMISSION HISTORY AND PHYSICAL  Tamara Garza is a 27 y.o. female G2P1001 with IUP at [redacted]w[redacted]d by 5wkUS presenting in active labor.   Reports fetal movement. Denies vaginal bleeding.  Now with contractions every 3-5 min  She received her prenatal care at [facility]. -HP Support person in labor: mom in MAU  Ultrasounds . Anatomy U/S:  normal  Prenatal History/Complications: Marland Kitchen GBS unknown . Hx of asthma ( no albuterol need during this pregnancy) . obesity  Past Medical History: Past Medical History:  Diagnosis Date  . ACNE 04/04/2006   Qualifier: Diagnosis of  By: Abundio Miu    . Asthma   . ASTHMA, INTERMITTENT 04/04/2006   Qualifier: Diagnosis of  By: Lelon Perla MD, Vickki Muff    . Depression   . Gallstones 2011  . 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 Infection    UTI  . LGSIL (low grade squamous intraepithelial dysplasia)   . Migraine 11/21/2011  . Obesity   . RHINITIS, ALLERGIC 04/04/2006   Qualifier: Diagnosis of  By: Abundio Miu    . Vaginal Pap smear, abnormal    ok since colpo    Past Surgical History: Past Surgical History:  Procedure Laterality Date  . CHOLECYSTECTOMY    . COLPOSCOPY  2009    Obstetrical History: OB History    Gravida  2   Para  1   Term  1   Preterm  0   AB  0   Living  1     SAB  0   TAB  0   Ectopic  0   Multiple  0   Live Births  1           Social History: Social History   Socioeconomic History  . Marital status: Single    Spouse name: Not on file  . Number of children: Not on file  . Years of education: Not on file  . Highest education level: Not on file  Occupational History  . Not on file  Social Needs  . Financial resource strain: Not on file  . Food insecurity:    Worry: Not on file    Inability: Not on file  . Transportation needs:    Medical: Not on file    Non-medical: Not on file  Tobacco Use  . Smoking status: Former Smoker    Packs/day: 0.25    Last attempt to quit: 2016    Years  since quitting: 3.6  . Smokeless tobacco: Never Used  Substance and Sexual Activity  . Alcohol use: No  . Drug use: No  . Sexual activity: Yes    Birth control/protection: None    Comment: last sex 28 Jan 2017  Lifestyle  . Physical activity:    Days per week: Not on file    Minutes per session: Not on file  . Stress: Not on file  Relationships  . Social connections:    Talks on phone: Not on file    Gets together: Not on file    Attends religious service: Not on file    Active member of club or organization: Not on file    Attends meetings of clubs or organizations: Not on file    Relationship status: Not on file  Other Topics Concern  . Not on file  Social History Narrative  . Not on file    Family History: Family History  Problem Relation Age of Onset  . Multiple sclerosis Mother   . Diabetes Father   .  Hypertension Father   . Asthma Father   . Breast cancer Maternal Grandmother   . Cancer Maternal Grandmother   . Hypertension Maternal Grandmother   . Dementia Maternal Grandfather     Allergies: Allergies  Allergen Reactions  . Eggs Or Egg-Derived Products Swelling  . Penicillins Rash    Has patient had a PCN reaction causing immediate rash, facial/tongue/throat swelling, SOB or lightheadedness with hypotension: unknown Has patient had a PCN reaction causing severe rash involving mucus membranes or skin necrosis: unknown Has patient had a PCN reaction that required hospitalization unknown Has patient had a PCN reaction occurring within the last 10 years: no If all of the above answers are "NO", then may proceed with Cephalosporin use.   . Sulfonamide Derivatives Rash    Medications Prior to Admission  Medication Sig Dispense Refill Last Dose  . albuterol (PROVENTIL HFA;VENTOLIN HFA) 108 (90 Base) MCG/ACT inhaler Inhale 1-2 puffs into the lungs every 6 (six) hours as needed for wheezing or shortness of breath. 1 Inhaler 0 Taking  . cyclobenzaprine (FLEXERIL)  5 MG tablet Take 1-2 tablets (5-10 mg total) by mouth 3 (three) times daily as needed for muscle spasms. (Patient not taking: Reported on 06/05/2017) 30 tablet 0 Not Taking  . Prenat-FeCbn-FeAsp-Meth-FA-DHA (PRENATE MINI) 18-0.6-0.4-350 MG CAPS Take 1 tablet by mouth daily. (Patient not taking: Reported on 09/26/2017) 30 capsule 12 Not Taking  . Prenatal Multivit-Min-Fe-FA (PRENATAL VITAMINS) 0.8 MG tablet Take 1 tablet by mouth daily. 30 tablet 12 Taking     Review of Systems  All systems reviewed and negative except as stated in HPI  not currently breastfeeding., plans to General appearance: alert, appears stated age and mild distress Lungs: no respiratory distress Heart: regular rate  Abdomen: moderate pain during contractions Pelvic: dilated to 6 per RN Extremities: Homans sign is negative, no sign of DVT Presentation: cephalic per MAU US Fetal monitoring: Cat 1 Uterine activity: regular contractions Dilation: 6 Effacement (%): 80  Prenatal labs: ABO, Rh: O/Positive/-- (03/04 1444) Antibody: Negative (03/04 1444) Rubella: 2.05 (03/04 1444) RPR: Non Reactive (06/12 1535)  HBsAg: Negative (03/04 1444)  HIV: Non Reactive (06/12 1535)  GBS:   unknown, urine drawn 8/22, now w/ pcr pending Glucola: 1hr 104 Genetic screening:  None recorded  Prenatal Transfer Tool  Maternal Diabetes: No Genetic Screening: none performed Maternal Ultrasounds/Referrals: Normal Fetal Ultrasounds or other Referrals:  None Maternal Substance Abuse:  No Significant Maternal Medications:  Meds include: Other: albuterol prn Significant Maternal Lab Results: Lab values include: Other:  GBS unknown  Results for orders placed or performed during the hospital encounter of 09/27/17 (from the past 24 hour(s))  CBC   Collection Time: 09/27/17  3:43 AM  Result Value Ref Range   WBC 8.1 4.0 - 10.5 K/uL   RBC 3.81 (L) 3.87 - 5.11 MIL/uL   Hemoglobin 11.0 (L) 12.0 - 15.0 g/dL   HCT 09.8 (L) 11.9 - 14.7 %    MCV 89.0 78.0 - 100.0 fL   MCH 28.9 26.0 - 34.0 pg   MCHC 32.4 30.0 - 36.0 g/dL   RDW 82.9 56.2 - 13.0 %   Platelets 173 150 - 400 K/uL    Patient Active Problem List   Diagnosis Date Noted  . Labor and delivery, indication for care 09/27/2017  . Size of fetus inconsistent with dates, antepartum 09/26/2017  . Obesity affecting pregnancy in second trimester   . Supervision of other normal pregnancy, antepartum 04/08/2017  . Thumb pain, left 05/02/2016  .  Former smoker 01/23/2016  . Migraine 11/21/2011  . 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 Left breast mass 11/21/2011  . LGSIL on Pap smear of cervix   . Obesity 04/04/2006  . Hearing loss 04/04/2006  . RHINITIS, ALLERGIC 04/04/2006  . Asthma affecting pregnancy, antepartum 04/04/2006    Assessment/Plan:  Tamara Garza is a 27 y.o. G2P1001 at [redacted]w[redacted]d here for active labor   Labor: progressing spontaneously -- pain control: epidural  Fetal Wellbeing: Cephalic by [place].  -- GBS (unknown), pending -- continuous fetal monitoring - via external monitor   Postpartum Planning -- breast/undecided (had prior planned BTL but now declines)  Marthenia Rolling, DO

## 2017-09-27 NOTE — MAU Note (Signed)
SROM at 0200-clear fluid.  No VB.  +FM. CTX 2-5 minutes.

## 2017-09-27 NOTE — Lactation Note (Signed)
This note was copied from a baby's chart. Lactation Consultation Note  Patient Name: Tamara Garza [id number] Date: 09/27/2017 Reason for consult: Initial assessment;Early term 37-38.6wks P2, 4 hr female infant, ETI 38 wks 5 days LC entered room, mom was eating dinner, mom has #LC to call when she is ready for a  BFconsult.   Maternal Data    Feeding Feeding Type: Breast Fed  LATCH Score Latch: Repeated attempts needed to sustain latch, nipple held in mouth throughout feeding, stimulation needed to elicit sucking reflex.  Audible Swallowing: A few with stimulation  Type of Nipple: Everted at rest and after stimulation  Comfort (Breast/Nipple): Soft / non-tender  Hold (Positioning): Assistance needed to correctly position infant at breast and maintain latch.  LATCH Score: 7  Interventions    Lactation Tools Discussed/Used     Consult Status      Tamara Garza 09/27/2017, 8:01 PM

## 2017-09-27 NOTE — Anesthesia Procedure Notes (Addendum)
Epidural Patient location during procedure: OB Start time: 09/27/2017 4:33 AM End time: 09/27/2017 4:42 AM  Staffing Anesthesiologist: Mal Amabile, MD  Preanesthetic Checklist Completed: patient identified, site marked, surgical consent, pre-op evaluation, timeout performed, IV checked, risks and benefits discussed and monitors and equipment checked  Epidural Patient position: sitting Prep: site prepped and draped and DuraPrep Patient monitoring: continuous pulse ox and blood pressure Approach: midline Location: L3-L4 Injection technique: LOR air  Needle:  Needle type: Tuohy  Needle gauge: 17 G Needle length: 9 cm and 9 Needle insertion depth: 7 cm Catheter type: closed end flexible Catheter size: 19 Gauge Catheter at skin depth: 12 cm Test dose: negative and Other  Assessment Events: blood not aspirated, injection not painful, no injection resistance, negative IV test and no paresthesia  Additional Notes Patient identified. Risks and benefits discussed including failed block, incomplete  Pain control, post dural puncture headache, nerve damage, paralysis, blood pressure Changes, nausea, vomiting, reactions to medications-both toxic and allergic and post Partum back pain. All questions were answered. Patient expressed understanding and wished to proceed. Sterile technique was used throughout procedure. Epidural site was Dressed with sterile barrier dressing. No paresthesias, signs of intravascular injection Or signs of intrathecal spread were encountered. Difficult due to poor positioning. Attempt x 2 Patient was more comfortable after the epidural was dosed. Please see RN's note for documentation of vital signs and FHR which are stable.

## 2017-09-28 NOTE — Anesthesia Postprocedure Evaluation (Signed)
Anesthesia Post Note  Patient: Tamara Garza  Procedure(s) Performed: AN AD HOC LABOR EPIDURAL     Patient location during evaluation: Mother Baby Anesthesia Type: Epidural Level of consciousness: awake and alert and oriented Pain management: satisfactory to patient Vital Signs Assessment: post-procedure vital signs reviewed and stable Respiratory status: respiratory function stable Cardiovascular status: stable Postop Assessment: no headache, no backache, epidural receding, patient able to bend at knees, no signs of nausea or vomiting and adequate PO intake Anesthetic complications: no    Last Vitals:  Vitals:   09/27/17 2315 09/28/17 0617  BP: 104/63 107/77  Pulse: 94 100  Resp: 18 18  Temp: 36.8 C 36.7 C  SpO2:      Last Pain:  Vitals:   09/28/17 0617  TempSrc: Oral  PainSc: 0-No pain   Pain Goal:                 Corday Wyka

## 2017-09-28 NOTE — Progress Notes (Addendum)
POSTPARTUM PROGRESS NOTE  Post Partum Day 1 Subjective:  Tamara Garza is a 27 y.o. [id number] [redacted]w[redacted]d s/p SVD.  No acute events overnight.  Pt denies problems with ambulating, voiding or po intake.  She denies nausea or vomiting.  Pain is poorly controlled on tylenol and ibuprofen.  She has had flatus. She has not had bowel movement.  Lochia Moderate.   Objective: Blood pressure 124/83, pulse 83, temperature 98.5 F (36.9 C), resp. rate 18, height 5\' 5"  (1.651 m), weight 121.6 kg, SpO2 99 %, unknown if currently breastfeeding.  Physical Exam:  General: alert, cooperative and no distress Lochia:normal flow Chest: CTAB Heart: RRR no m/r/g Abdomen: +BS, soft, nontender,  Uterine Fundus: firm DVT Evaluation: No calf swelling or tenderness Extremities: no edema  Recent Labs    09/27/17 0343  HGB 11.0*  HCT 33.9*    Assessment/Plan: ASSESSMENT: Tamara Garza is a 27 y.o. A5W0981 [redacted]w[redacted]d s/p SVD. Breastfeeding baby.  Condoms for contraception. Social work as seen pt and signed off without concern for depression at this time. Plan to DC home on PPD#2 Will consider additional pain meds if poorly controlled pain persists    LOS: 1 day   Mirian Mo, MD 09/28/2017, 3:44 PM

## 2017-09-28 NOTE — Progress Notes (Signed)
Mother of baby was referred for history of depression. Referral screened out by CSW because per chart review, MOB's diagnosis originated greater than three years ago and no documented occurrences of MOB experiencing symptoms in chart or prenatal record. MOB was assessed by LCSW during last delivery in April 2018 and MOB denied symptoms of depression and correlated her feelings to her hormonal imbalance.   Please contact CSW if mother of baby requests, if needs arise, or if mother of baby scores greater than a nine or answers yes to question ten on [place] Postpartum Depression Screen.   Edwin Dada, MSW, LCSW-A Clinical Social Worker [facility] [phone]

## 2017-09-29 NOTE — Lactation Note (Signed)
This note was copied from a baby's chart. Lactation Consultation Note  Patient Name: Tamara Garza [id number] Date: 09/29/2017 Reason for consult: Follow-up assessment;Early term 37-38.6wks P2, 34 hour, ETI infant with 2% weight loss. Mom is feeling more confident w/ BF , per mom she BF infant for 30 mins prior to [facility] entering the room. Reviewed hand expression w/ mom and mom hand expressed 3 ml which she will give to  infant at the next feeding due to infant being  asleep.  Dad is doing STS w/ infant. Mom encouraged to feed baby 8-12 times/24 hours and with feeding cues.  LC discussed engorgement prevention and treatment.   Mom made aware of O/P services, breastfeeding support groups, community resources, and our phone # for post-discharge questions.    Maternal Data    Feeding Feeding Type: Breast Fed  LATCH Score Latch: Grasps breast easily, tongue down, lips flanged, rhythmical sucking.  Audible Swallowing: A few with stimulation  Type of Nipple: Everted at rest and after stimulation  Comfort (Breast/Nipple): Soft / non-tender  Hold (Positioning): No assistance needed to correctly position infant at breast.  LATCH Score: 9  Interventions    Lactation Tools Discussed/Used     Consult Status Consult Status: Follow-up Date: 09/29/17 Follow-up type: In-patient    Danelle Earthly 09/29/2017, 2:11 AM

## 2017-09-29 NOTE — Lactation Note (Signed)
This note was copied from a baby's chart. Lactation Consultation Note  Patient Name: Tamara Garza [id number] Date: 09/29/2017 Reason for consult: Follow-up assessment;Early term 59-38.6wks Mom reports that she can see milk when she hand expresses.  Baby is latching well and recently cluster feeding.  Instructed to feed baby with cues.  Lactation outpatient services and support reviewed and encouraged prn.  Maternal Data    Feeding    LATCH Score                   Interventions    Lactation Tools Discussed/Used     Consult Status Consult Status: Complete Follow-up type: Call as needed    Huston Foley 09/29/2017, 10:05 AM

## 2017-09-29 NOTE — Discharge Summary (Addendum)
OB Discharge Summary     Patient Name: Tamara Garza DOB: 1990-12-01 MRN: [id number]  Date of admission: 09/27/2017 Delivering MD: Arvilla Market   Date of discharge: 09/29/2017  Admitting diagnosis: 38 WEEKS CTX LEAKING FLUID Intrauterine pregnancy: [redacted]w[redacted]d     Secondary diagnosis:  Active Problems:   Labor and delivery, indication for care  Additional problems: none     Discharge diagnosis: Term Pregnancy Delivered                                                                                                Post partum procedures:none  Augmentation: none  Complications: None  Hospital course:  Onset of Labor With Vaginal Delivery     27 y.o. yo [id number] at [redacted]w[redacted]d was admitted in Active Labor on 09/27/2017. Patient had an uncomplicated labor course as follows:  Membrane Rupture Time/Date: 2:00 AM ,09/27/2017   Intrapartum Procedures: Episiotomy: None [1]                                         Lacerations:  2nd degree [3];Perineal [11];Periurethral [8]  Patient had a delivery of a Viable infant. 09/27/2017  Information for the patient's newborn:  Dalisa, Forrer [[id number]]  Delivery Method: Vag-Spont    Pateint had an uncomplicated postpartum course.  She is ambulating, tolerating a regular diet, passing flatus, and urinating well. Patient is discharged home in stable condition on 09/29/17.   Physical exam  Vitals:   09/28/17 1030 09/28/17 1520 09/28/17 2106 09/29/17 0612  BP: 118/67 124/83 110/73 (!) 107/57  Pulse: 80 83 91 (!) 101  Resp: 18 18 16 18   Temp: 98.1 F (36.7 C) 98.5 F (36.9 C) 98.1 F (36.7 C) 98.3 F (36.8 C)  TempSrc: Oral  Oral Oral  SpO2:      Weight:      Height:       General: alert, cooperative and no distress Lochia: appropriate Uterine Fundus: firm Incision: Healing well with no significant drainage DVT Evaluation: No evidence of DVT seen on physical exam. Labs: Lab Results  Component Value Date   WBC 8.1 09/27/2017   HGB 11.0 (L) 09/27/2017   HCT 33.9 (L) 09/27/2017   MCV 89.0 09/27/2017   PLT 173 09/27/2017   CMP Latest Ref Rng & Units 01/29/2017  Glucose 65 - 99 mg/dL 97  BUN 6 - 20 mg/dL 10  Creatinine 1.30 - 8.65 mg/dL 7.84  Sodium 696 - 295 mmol/L 138  Potassium 3.5 - 5.1 mmol/L 3.7  Chloride 101 - 111 mmol/L 107  CO2 22 - 32 mmol/L 23  Calcium 8.9 - 10.3 mg/dL 2.8(U)  Total Protein 6.5 - 8.1 g/dL 7.2  Total Bilirubin 0.3 - 1.2 mg/dL 0.4  Alkaline Phos 38 - 126 U/L 64  AST 15 - 41 U/L 23  ALT 14 - 54 U/L 19    Discharge instruction: per After Visit Summary and "Baby and Me Booklet".  After visit meds:  Allergies as of 09/29/2017  Reactions   Eggs Or Egg-derived Products Swelling   Penicillins Rash   Has patient had a PCN reaction causing immediate rash, facial/tongue/throat swelling, SOB or lightheadedness with hypotension: unknown Has patient had a PCN reaction causing severe rash involving mucus membranes or skin necrosis: unknown Has patient had a PCN reaction that required hospitalization unknown Has patient had a PCN reaction occurring within the last 10 years: no If all of the above answers are "NO", then may proceed with Cephalosporin use.   Sulfonamide Derivatives Rash      Medication List    TAKE these medications   Prenatal Vitamins 0.8 MG tablet Take 1 tablet by mouth daily.       Diet: routine diet  Activity: Advance as tolerated. Pelvic rest for 6 weeks.   Outpatient follow up:4 weeks Follow up Appt: Future Appointments  Date Time Provider Department Center  10/03/2017  9:15 AM [facility] [place] 4 [facility]  10/04/2017  9:45 AM Willodean Rosenthal, MD [facility] None   Follow up Visit:No follow-ups on file.  Postpartum contraception: Condoms was previously planning BTL but declined.  Newborn Data: Live born female  Birth Weight: 8 lb 10.1 oz (3915 g) APGAR: 8, 9  Newborn Delivery   Birth date/time:  09/27/2017 15:40:00 Delivery type:  Vaginal,  Spontaneous     Baby Feeding: Bottle Disposition:home with mother   09/29/2017 Mirian Mo, MD  I examined this patient and agree with the above assessment D Hart [place] cnm

## 2017-09-29 NOTE — Discharge Instructions (Signed)
Vaginal Delivery, Care After °Refer to this sheet in the next few weeks. These instructions provide you with information about caring for yourself after vaginal delivery. Your health care provider may also give you more specific instructions. Your treatment has been planned according to current medical practices, but problems sometimes occur. Call your health care provider if you have any problems or questions. °What can I expect after the procedure? °After vaginal delivery, it is common to have: °· Some bleeding from your vagina. °· Soreness in your abdomen, your vagina, and the area of skin between your vaginal opening and your anus (perineum). °· Pelvic cramps. °· Fatigue. ° °Follow these instructions at home: °Medicines °· Take over-the-counter and prescription medicines only as told by your health care provider. °· If you were prescribed an antibiotic medicine, take it as told by your health care provider. Do not stop taking the antibiotic until it is finished. °Driving ° °· Do not drive or operate heavy machinery while taking prescription pain medicine. °· Do not drive for 24 hours if you received a sedative. °Lifestyle °· Do not drink alcohol. This is especially important if you are breastfeeding or taking medicine to relieve pain. °· Do not use tobacco products, including cigarettes, chewing tobacco, or e-cigarettes. If you need help quitting, ask your health care provider. °Eating and drinking °· Drink at least 8 eight-ounce glasses of water every day unless you are told not to by your health care provider. If you choose to breastfeed your baby, you may need to drink more water than this. °· Eat high-fiber foods every day. These foods may help prevent or relieve constipation. High-fiber foods include: °? Whole grain cereals and breads. °? Brown rice. °? Beans. °? Fresh fruits and vegetables. °Activity °· Return to your normal activities as told by your health care provider. Ask your health care provider  what activities are safe for you. °· Rest as much as possible. Try to rest or take a nap when your baby is sleeping. °· Do not lift anything that is heavier than your baby or 10 lb (4.5 kg) until your health care provider says that it is safe. °· Talk with your health care provider about when you can engage in sexual activity. This may depend on your: °? Risk of infection. °? Rate of healing. °? Comfort and desire to engage in sexual activity. °Vaginal Care °· If you have an episiotomy or a vaginal tear, check the area every day for signs of infection. Check for: °? More redness, swelling, or pain. °? More fluid or blood. °? Warmth. °? Pus or a bad smell. °· Do not use tampons or douches until your health care provider says this is safe. °· Watch for any blood clots that may pass from your vagina. These may look like clumps of dark red, brown, or black discharge. °General instructions °· Keep your perineum clean and dry as told by your health care provider. °· Wear loose, comfortable clothing. °· Wipe from front to back when you use the toilet. °· Ask your health care provider if you can shower or take a bath. If you had an episiotomy or a perineal tear during labor and delivery, your health care provider may tell you not to take baths for a certain length of time. °· Wear a bra that supports your breasts and fits you well. °· If possible, have someone help you with household activities and help care for your baby for at least a few days after   you leave the hospital. °· Keep all follow-up visits for you and your baby as told by your health care provider. This is important. °Contact a health care provider if: °· You have: °? Vaginal discharge that has a bad smell. °? Difficulty urinating. °? Pain when urinating. °? A sudden increase or decrease in the frequency of your bowel movements. °? More redness, swelling, or pain around your episiotomy or vaginal tear. °? More fluid or blood coming from your episiotomy or  vaginal tear. °? Pus or a bad smell coming from your episiotomy or vaginal tear. °? A fever. °? A rash. °? Little or no interest in activities you used to enjoy. °? Questions about caring for yourself or your baby. °· Your episiotomy or vaginal tear feels warm to the touch. °· Your episiotomy or vaginal tear is separating or does not appear to be healing. °· Your breasts are painful, hard, or turn red. °· You feel unusually sad or worried. °· You feel nauseous or you vomit. °· You pass large blood clots from your vagina. If you pass a blood clot from your vagina, save it to show to your health care provider. Do not flush blood clots down the toilet without having your health care provider look at them. °· You urinate more than usual. °· You are dizzy or light-headed. °· You have not breastfed at all and you have not had a menstrual period for 12 weeks after delivery. °· You have stopped breastfeeding and you have not had a menstrual period for 12 weeks after you stopped breastfeeding. °Get help right away if: °· You have: °? Pain that does not go away or does not get better with medicine. °? Chest pain. °? Difficulty breathing. °? Blurred vision or spots in your vision. °? Thoughts about hurting yourself or your baby. °· You develop pain in your abdomen or in one of your legs. °· You develop a severe headache. °· You faint. °· You bleed from your vagina so much that you fill two sanitary pads in one hour. °This information is not intended to replace advice given to you by your health care provider. Make sure you discuss any questions you have with your health care provider. °Document Released: 01/20/2000 Document Revised: 07/06/2015 Document Reviewed: 02/06/2015 °Elsevier Interactive Patient Education © 2018 [employer] ° °

## 2017-10-01 ENCOUNTER — Ambulatory Visit: Payer: Self-pay

## 2017-10-01 NOTE — Lactation Note (Signed)
This note was copied from a baby's chart. Lactation Consultation Note  Patient Name: Tamara Garza PIRJJ'O Date: 10/01/2017 Reason for consult: Follow-up assessment(dehydration)   Baby 93 hours old admitted to [facility] for dehydration which is now resolved.  Mother states baby became sleepy at home and would not breastfeed. LC reviewed hand expression with mother and drops easily expressed. Mother had pumped approx 30 ml + with DEBP. Mother latched baby in cradle hold.  She states it hurts when she first latches and then it becomes comfortable. LC repositioned baby to cross cradle.  When infant's suckles slowed, taught mother to compress breast to keep baby active. Frequent sucks and swallows observed for 22 min.  Baby had recently breastfed prior to this feeding. Supplemented with 5 ml of breastmilk via curved tip syringe after feeding.  Discussed option of using slow flow nipple.   Mother has hand pump at home. Discussed if she goes home and baby becomes sleepy again and will not breastfeed, she could pump and give volume to baby. Mom encouraged to feed baby 8-12 times/24 hours and with feeding cues.  Monitor voids/stools.  Provided mother w/ 27 flanges.     Maternal Data    Feeding Feeding Type: Breast Fed Length of feed: 15 min  LATCH Score Latch: Grasps breast easily, tongue down, lips flanged, rhythmical sucking.  Audible Swallowing: Spontaneous and intermittent  Type of Nipple: Everted at rest and after stimulation  Comfort (Breast/Nipple): Soft / non-tender  Hold (Positioning): Assistance needed to correctly position infant at breast and maintain latch.  LATCH Score: 9  Interventions Interventions: Skin to skin;Hand express;Breast compression;Support pillows;Adjust position;DEBP  Lactation Tools Discussed/Used     Consult Status      Hardie Pulley 10/01/2017, 1:28 PM

## 2017-10-03 ENCOUNTER — Ambulatory Visit (HOSPITAL_COMMUNITY): Admission: RE | Admit: 2017-10-03 | Payer: Medicaid Other | Source: Ambulatory Visit

## 2017-10-03 LAB — CULTURE, BETA STREP (GROUP B ONLY): Strep Gp B Culture: POSITIVE — AB

## 2017-10-04 ENCOUNTER — Encounter: Payer: Self-pay | Admitting: Obstetrics & Gynecology

## 2017-10-09 NOTE — Progress Notes (Signed)
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 A new letter was done for pt.

## 2017-10-09 NOTE — Progress Notes (Signed)
Pt emailed the office requesting a Dr's note to return to work on 10/20/17.Per Dr. Macon Large, pt can return back to work. A letter was given to pt.

## 2017-10-31 ENCOUNTER — Ambulatory Visit (INDEPENDENT_AMBULATORY_CARE_PROVIDER_SITE_OTHER): Payer: Medicaid Other | Admitting: Family Medicine

## 2017-10-31 ENCOUNTER — Encounter: Payer: Self-pay | Admitting: Family Medicine

## 2017-10-31 ENCOUNTER — Ambulatory Visit: Payer: Self-pay | Admitting: Family Medicine

## 2017-10-31 DIAGNOSIS — Z1389 Encounter for screening for other disorder: Secondary | ICD-10-CM | POA: Diagnosis not present

## 2017-10-31 MED ORDER — METRONIDAZOLE 500 MG PO TABS: 500.0000 mg | ORAL_TABLET | Freq: Two times a day (BID) | ORAL | 0 refills | Status: DC

## 2017-10-31 NOTE — Progress Notes (Signed)
Subjective:     Tamara Garza is a 27 y.o. female who presents for a postpartum visit. She is 4 weeks postpartum following a spontaneous vaginal delivery. I have fully reviewed the prenatal and intrapartum course. The delivery was at 38 gestational weeks. Outcome: spontaneous vaginal delivery. Anesthesia: epidural. Postpartum course has been normal. Baby's course has been normal. Baby is feeding by breast. Bleeding no bleeding. Bowel function is normal. Bladder function is normal. Patient is not sexually active. Contraception method is none. Postpartum depression screening: negative.  Feels like she has a BV infection  The following portions of the patient's history were reviewed and updated as appropriate: allergies, current medications, past family history, past medical history, past social history, past surgical history and problem list.  Review of Systems Pertinent items noted in HPI and remainder of comprehensive ROS otherwise negative.   Objective:    There were no vitals taken for this visit.  General:  alert, cooperative and no distress  Lungs: clear to auscultation bilaterally  Heart:  regular rate and rhythm, S1, S2 normal, no murmur, click, rub or gallop  Abdomen: soft, non-tender; bowel sounds normal; no masses,  no organomegaly   Vulva:  normal  Vagina: healing perineal laceration        Assessment:     Normal postpartum exam. Pap smear not done at today's visit.   Plan:    1. Contraception: condoms 2. Flagyl for BV infection - emperically treat. 3. Follow up in: 4 months or as needed.

## 2017-11-27 ENCOUNTER — Other Ambulatory Visit: Payer: Self-pay

## 2017-11-29 MED ORDER — METRONIDAZOLE 500 MG PO TABS: 500.0000 mg | ORAL_TABLET | Freq: Two times a day (BID) | ORAL | 0 refills | Status: DC

## 2017-12-12 IMAGING — MR MR [PERSON_NAME]*[PERSON_NAME]* W/O CM
7 series · 40 of 40 positions shown · non-contrast
Comparison: None.

CLINICAL DATA: The patient reports pain at the base of the left
thumb since a dislocation injury 2 years ago.

EXAM:
MRI OF THE LEFT HAND WITHOUT CONTRAST
TECHNIQUE: Multiplanar, multisequence MR imaging of the left hand/thumb was
performed. No intravenous contrast was administered.

[Series 5: T1 · axial · 3.0mm · 0.47mm/px · z∈[-26,+82]mm · 9 of 34 slices shown]
[im 1/34]
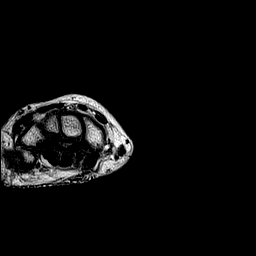
[im 5/34]
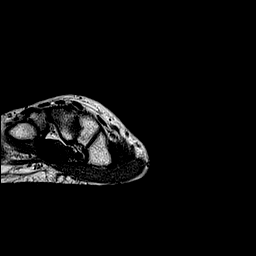
[im 9/34]
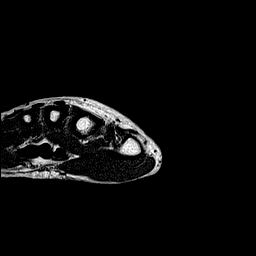
[im 13/34]
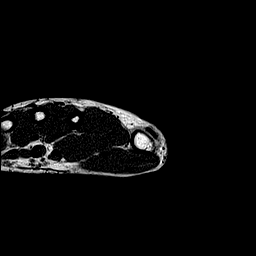
[im 17/34]
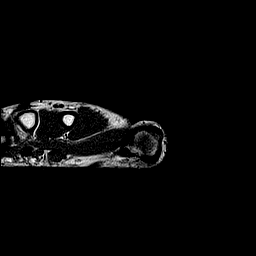
[im 21/34]
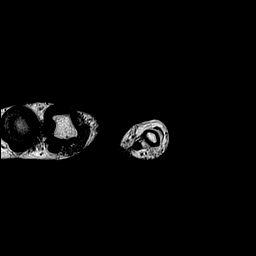
[im 25/34]
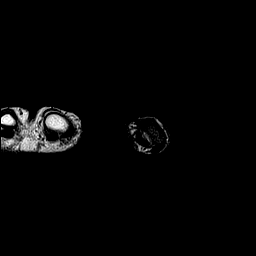
[im 29/34]
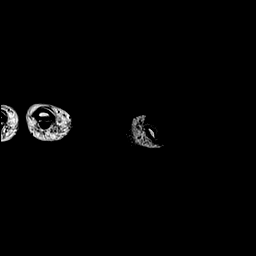
[im 34/34]
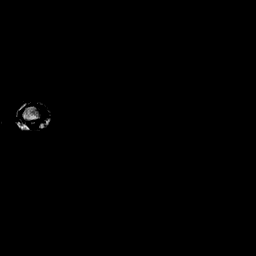

[Series 12: T2 fat-sat · oblique · 3.0mm · 0.47mm/px · 10 of 37 slices shown (1 of 3)]
[im 1/37]
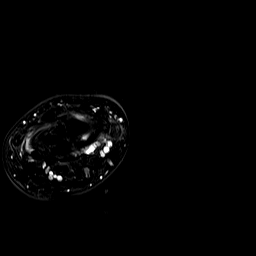
[im 5/37]
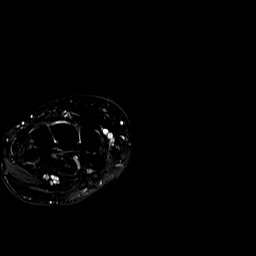
[im 9/37]
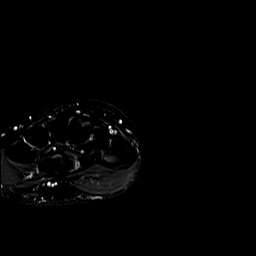
[im 13/37]
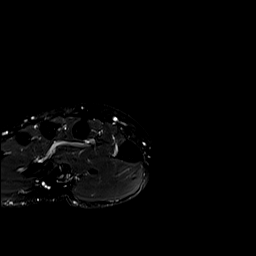
[im 17/37]
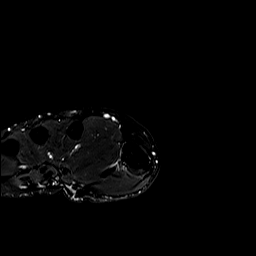
[im 21/37]
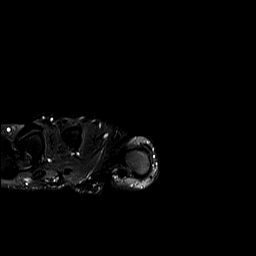
[im 25/37]
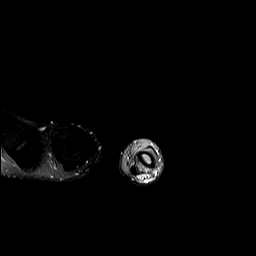
[im 29/37]
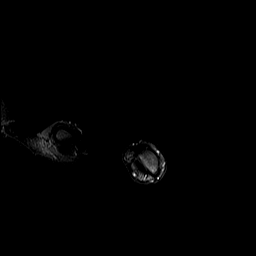
[im 33/37]
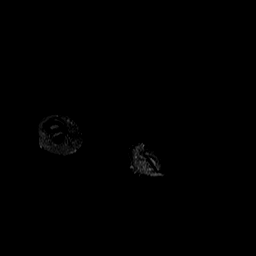
[im 37/37]
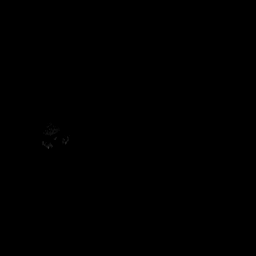

[Series 100: T2 fat-sat · oblique · 2.0mm · 0.62mm/px · 5 of 16 slices shown (2 of 3)]
[im 1/16]
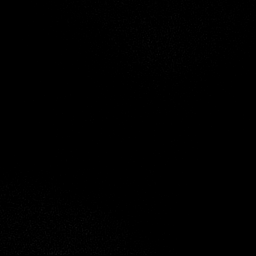
[im 4/16]
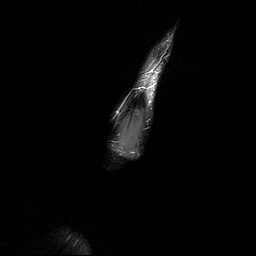
[im 8/16]
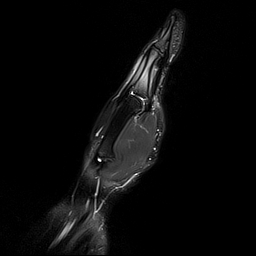
[im 12/16]
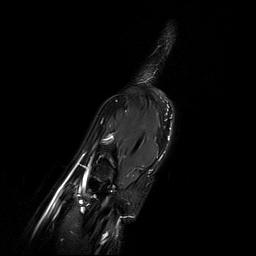
[im 16/16]
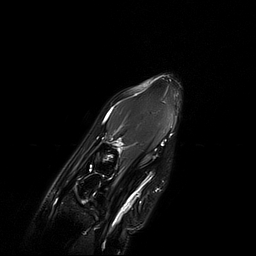

[Series 101: T2 fat-sat · oblique · 2.0mm · 0.62mm/px · 5 of 16 slices shown (3 of 3)]
[im 1/16]
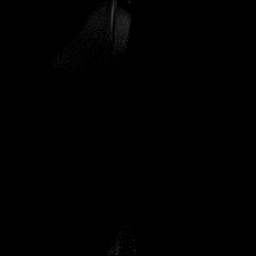
[im 4/16]
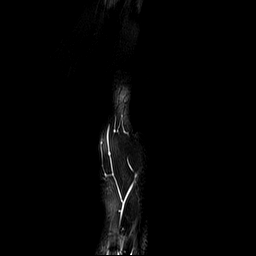
[im 8/16]
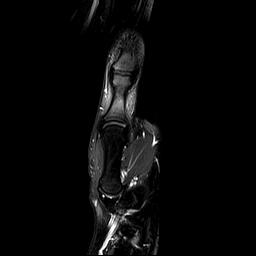
[im 12/16]
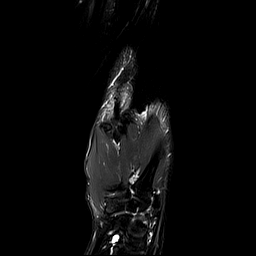
[im 16/16]
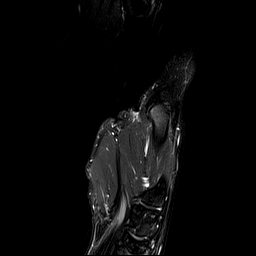

[Series 102: PD fat-sat · oblique · 2.0mm · 0.62mm/px · 5 of 16 slices shown (1 of 2)]
[im 1/16]
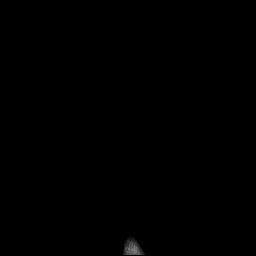
[im 4/16]
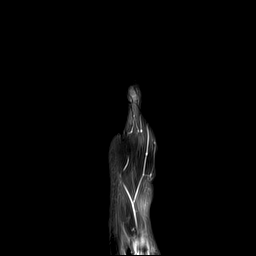
[im 8/16]
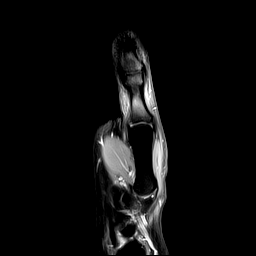
[im 12/16]
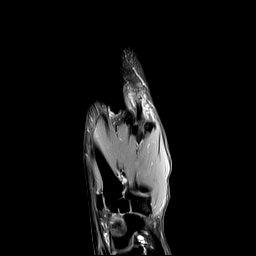
[im 16/16]
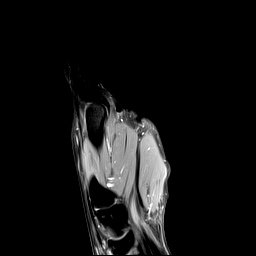

[Series 103: PD fat-sat · oblique · 2.0mm · 0.62mm/px · 5 of 16 slices shown (2 of 2)]
[im 1/16]
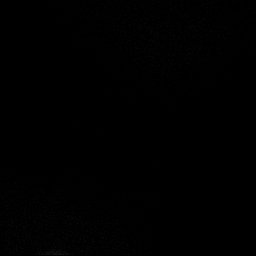
[im 4/16]
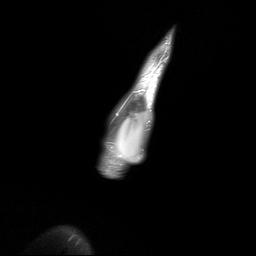
[im 8/16]
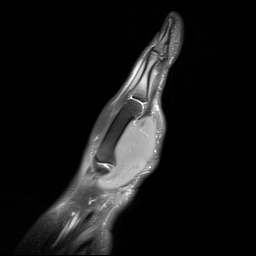
[im 12/16]
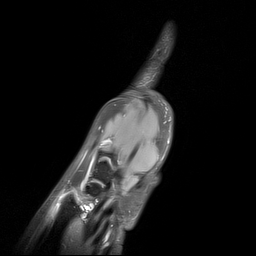
[im 16/16]
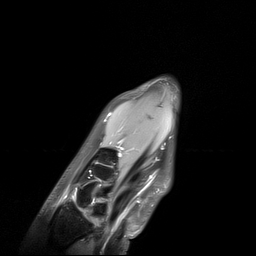

[Series 104: hx · axial · 8.0mm · 0.59mm/px · 1 of 1 slices shown]
[im 1/1]
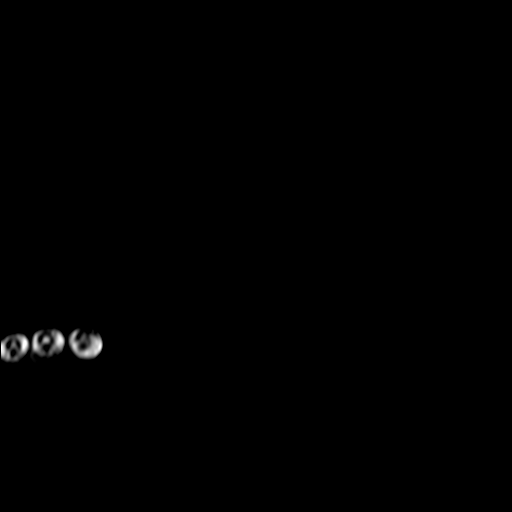

[40 of 40 positions shown; findings below may reference images not displayed]

FINDINGS: Bones/Joint/Cartilage

Marrow signal is normal throughout without fracture, contusion,
stress change or focal lesion.

Ligaments

The ulnar and radial collateral ligaments of both the IP joint of
the thumb and first MCP joint are intact and normal in appearance.

Muscles and Tendons

Intact and normal in appearance throughout.

Soft tissues

Normal.  No atrophy or focal lesion.
IMPRESSION: Negative for collateral ligament tear.  Normal examination.

## 2018-01-07 MED ORDER — BACLOFEN 10 MG PO TABS: 10.0000 mg | ORAL_TABLET | Freq: Three times a day (TID) | ORAL | 0 refills | Status: DC

## 2018-01-07 NOTE — Addendum Note (Signed)
Addended by: Levie Heritage on: 01/07/2018 07:56 AM   Modules accepted: Orders

## 2018-09-23 ENCOUNTER — Other Ambulatory Visit: Payer: Self-pay

## 2018-09-23 ENCOUNTER — Encounter (HOSPITAL_BASED_OUTPATIENT_CLINIC_OR_DEPARTMENT_OTHER): Payer: Self-pay | Admitting: *Deleted

## 2018-09-23 ENCOUNTER — Emergency Department (HOSPITAL_BASED_OUTPATIENT_CLINIC_OR_DEPARTMENT_OTHER)
Admission: EM | Admit: 2018-09-23 | Discharge: 2018-09-23 | Disposition: A | Discharge status: Home / Self Care | Payer: Medicaid Other | Attending: Emergency Medicine | Admitting: Emergency Medicine

## 2018-09-23 DIAGNOSIS — J4520 Mild intermittent asthma, uncomplicated: Secondary | ICD-10-CM | POA: Insufficient documentation

## 2018-09-23 DIAGNOSIS — L292 Pruritus vulvae: Secondary | ICD-10-CM | POA: Insufficient documentation

## 2018-09-23 DIAGNOSIS — Z91012 Allergy to eggs: Secondary | ICD-10-CM | POA: Insufficient documentation

## 2018-09-23 DIAGNOSIS — Z882 Allergy status to sulfonamides status: Secondary | ICD-10-CM | POA: Insufficient documentation

## 2018-09-23 DIAGNOSIS — Z880 Allergy status to penicillin: Secondary | ICD-10-CM | POA: Insufficient documentation

## 2018-09-23 DIAGNOSIS — N898 Other specified noninflammatory disorders of vagina: Secondary | ICD-10-CM

## 2018-09-23 DIAGNOSIS — R300 Dysuria: Secondary | ICD-10-CM | POA: Insufficient documentation

## 2018-09-23 DIAGNOSIS — Z87891 Personal history of nicotine dependence: Secondary | ICD-10-CM | POA: Insufficient documentation

## 2018-09-23 LAB — PREGNANCY, URINE: Preg Test, Ur: NEGATIVE

## 2018-09-23 LAB — WET PREP, GENITAL
Clue Cells Wet Prep HPF POC: NONE SEEN
Sperm: NONE SEEN
Trich, Wet Prep: NONE SEEN
Yeast Wet Prep HPF POC: NONE SEEN

## 2018-09-23 LAB — URINALYSIS, MICROSCOPIC (REFLEX)

## 2018-09-23 LAB — URINALYSIS, ROUTINE W REFLEX MICROSCOPIC
Bilirubin Urine: NEGATIVE
Glucose, UA: NEGATIVE mg/dL
Hgb urine dipstick: NEGATIVE
Ketones, ur: NEGATIVE mg/dL
Nitrite: NEGATIVE
Protein, ur: NEGATIVE mg/dL
Specific Gravity, Urine: >1.03 — ABNORMAL HIGH (ref 1.005–1.030)
pH: 6 (ref 5.0–8.0)

## 2018-09-23 NOTE — Discharge Instructions (Addendum)
You were seen in the emergency department for some vaginal irritation.  There was no definite urine infection and you tested negative for yeast and BV.  There is still testing for gonorrhea and chlamydia that is pending and we will call you if it is positive.  You can use topical hydrocortisone cream over the burning areas to help soothe them.  It may be related to you having used boric acid topically.  We will call you if any antibiotics are indicated.

## 2018-09-23 NOTE — ED Triage Notes (Signed)
Pt c/o vaginal discharge x 2 weeks

## 2018-09-23 NOTE — ED Provider Notes (Signed)
[place] EMERGENCY DEPARTMENT Provider Note   CSN: [id number] Arrival date & time: 09/23/18  1923     History   Chief Complaint Chief Complaint  Patient presents with  . Vaginal Discharge    HPI Tamara Garza is a 28 y.o. female.  She is complaining of 2 weeks of burning down in her vaginal area.  She thought it may be BV is because she has had that before and she used some boric acid as a home remedy but it seemed to increase the burning sensation.  She says it is worse with urination but she does not feel like she has a UTI.  She said she is had vaginal discharge but she always was discharged and she does not really know if it is worse.  She is sexually active with a single partner and does not use any protection.     The history is provided by the patient.  Vaginal Discharge Quality:  Unable to specify Severity:  Moderate Onset quality:  Unable to specify Timing:  Intermittent Progression:  Unchanged Chronicity:  Recurrent Relieved by:  Nothing Worsened by:  Nothing Ineffective treatments: boric acid. Associated symptoms: vaginal itching   Associated symptoms: no abdominal pain, no dysuria, no fever, no nausea and no urinary frequency   Risk factors: unprotected sex     Past Medical History:  Diagnosis Date  . ACNE 04/04/2006   Qualifier: Diagnosis of  By: Eusebio Friendly    . Asthma   . ASTHMA, INTERMITTENT 04/04/2006   Qualifier: Diagnosis of  By: Tye Savoy MD, Tommi Rumps    . Depression   . Gallstones 2011  . 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 Infection    UTI  . LGSIL (low grade squamous intraepithelial dysplasia)   . Migraine 11/21/2011  . Obesity   . RHINITIS, ALLERGIC 04/04/2006   Qualifier: Diagnosis of  By: Eusebio Friendly    . Vaginal Pap smear, abnormal    ok since colpo    Patient Active Problem List   Diagnosis Date Noted  . Thumb pain, left 05/02/2016  . Former smoker 01/23/2016  . Migraine 11/21/2011  . 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 Left breast mass 11/21/2011  . LGSIL on Pap smear of cervix   . Obesity 04/04/2006  . Hearing loss 04/04/2006  . RHINITIS, ALLERGIC 04/04/2006  . Asthma affecting pregnancy, antepartum 04/04/2006    Past Surgical History:  Procedure Laterality Date  . CHOLECYSTECTOMY    . COLPOSCOPY  2009     OB History    Gravida  2   Para  2   Term  2   Preterm  0   AB  0   Living  2     SAB  0   TAB  0   Ectopic  0   Multiple  0   Live Births  2            Home Medications    Prior to Admission medications   Medication Sig Start Date End Date Taking? Authorizing Provider  baclofen (LIORESAL) 10 MG tablet Take 1 tablet (10 mg total) by mouth 3 (three) times daily. 01/07/18   Truett Mainland, DO  metroNIDAZOLE (FLAGYL) 500 MG tablet Take 1 tablet (500 mg total) by mouth 2 (two) times daily. 11/29/17   Truett Mainland, DO    Family History Family History  Problem Relation Age of Onset  . Multiple sclerosis Mother   . Diabetes  Father   . Hypertension Father   . Asthma Father   . Breast cancer Maternal Grandmother   . Cancer Maternal Grandmother   . Hypertension Maternal Grandmother   . Dementia Maternal Grandfather     Social History Social History   Tobacco Use  . Smoking status: Former Smoker    Packs/day: 0.25    Quit date: 2016    Years since quitting: 4.6  . Smokeless tobacco: Never Used  Substance Use Topics  . Alcohol use: No  . Drug use: No     Allergies   Eggs or egg-derived products, Penicillins, and Sulfonamide derivatives   Review of Systems Review of Systems  Constitutional: Negative for fever.  HENT: Negative for sore throat.   Eyes: Negative for visual disturbance.  Respiratory: Negative for shortness of breath.   Cardiovascular: Negative for chest pain.  Gastrointestinal: Negative for abdominal pain and nausea.  Genitourinary: Positive for vaginal discharge. Negative for dysuria.  Musculoskeletal:  Negative for neck pain.  Skin: Negative for rash.  Neurological: Negative for headaches.     Physical Exam Updated Vital Signs BP (!) 147/87   Pulse 72   Temp 97.8 F (36.6 C) (Oral)   Resp 16   Ht 5\' 6"  (1.676 m)   LMP 09/16/2018   SpO2 100%   BMI 38.59 kg/m   Physical Exam Vitals signs and nursing note reviewed. Exam conducted with a chaperone present.  Constitutional:      General: She is not in acute distress.    Appearance: She is well-developed.  HENT:     Head: Normocephalic and atraumatic.  Eyes:     Conjunctiva/sclera: Conjunctivae normal.  Neck:     Musculoskeletal: Neck supple.  Cardiovascular:     Rate and Rhythm: Normal rate and regular rhythm.     Heart sounds: No murmur.  Pulmonary:     Effort: Pulmonary effort is normal. No respiratory distress.     Breath sounds: Normal breath sounds.  Abdominal:     Palpations: Abdomen is soft.     Tenderness: There is no abdominal tenderness.  Genitourinary:    General: Normal vulva.     Pubic Area: No rash.      Vagina: Vaginal discharge (minimal white plaques on walls) present.     Cervix: Discharge present.     Uterus: Normal.      Adnexa: Right adnexa normal.  Skin:    General: Skin is warm and dry.     Capillary Refill: Capillary refill takes less than 2 seconds.  Neurological:     General: No focal deficit present.     Mental Status: She is alert.      ED Treatments / Results  Labs (all labs ordered are listed, but only abnormal results are displayed) Labs Reviewed  WET PREP, GENITAL - Abnormal; Notable for the following components:      Result Value   WBC, Wet Prep HPF POC MANY (*)    All other components within normal limits  URINALYSIS, ROUTINE W REFLEX MICROSCOPIC - Abnormal; Notable for the following components:   APPearance CLOUDY (*)    Specific Gravity, Urine >1.030 (*)    Leukocytes,Ua SMALL (*)    All other components within normal limits  URINALYSIS, MICROSCOPIC (REFLEX) -  Abnormal; Notable for the following components:   Bacteria, UA MANY (*)    All other components within normal limits  URINE CULTURE  PREGNANCY, URINE  GC/CHLAMYDIA PROBE AMP ([facility]) NOT AT [facility]  EKG None  Radiology No results found.  Procedures Procedures (including critical care time)  Medications Ordered in ED Medications - No data to display   Initial Impression / Assessment and Plan / ED Course  I have reviewed the triage vital signs and the nursing notes.  Pertinent labs & imaging results that were available during my care of the patient were reviewed by me and considered in my medical decision making (see chart for details).       Vaginal pain burning after self treating with boiric acid for possible BV that she has had in past. Some white discharge in vault. No evidence of pid. Will treat symtpomatic with hydrocortisone and wait on std tests.   Final Clinical Impressions(s) / ED Diagnoses   Final diagnoses:  Vaginal itching    ED Discharge Orders    None       Terrilee FilesButler, Alper Guilmette C, MD 09/24/18 [phone]

## 2018-09-25 LAB — URINE CULTURE: Special Requests: NORMAL

## 2018-09-25 LAB — GC/CHLAMYDIA PROBE AMP (~~LOC~~) NOT AT ARMC
Chlamydia: NEGATIVE
Neisseria Gonorrhea: NEGATIVE

## 2019-01-22 DIAGNOSIS — R87612 Low grade squamous intraepithelial lesion on cytologic smear of cervix (LGSIL): Secondary | ICD-10-CM | POA: Insufficient documentation

## 2019-07-23 DIAGNOSIS — J4520 Mild intermittent asthma, uncomplicated: Secondary | ICD-10-CM | POA: Insufficient documentation

## 2019-07-23 DIAGNOSIS — F325 Major depressive disorder, single episode, in full remission: Secondary | ICD-10-CM | POA: Insufficient documentation

## 2020-04-18 ENCOUNTER — Encounter (HOSPITAL_BASED_OUTPATIENT_CLINIC_OR_DEPARTMENT_OTHER): Payer: Self-pay | Admitting: Emergency Medicine

## 2020-04-18 ENCOUNTER — Emergency Department (HOSPITAL_BASED_OUTPATIENT_CLINIC_OR_DEPARTMENT_OTHER): Payer: Medicaid Other

## 2020-04-18 ENCOUNTER — Emergency Department (HOSPITAL_BASED_OUTPATIENT_CLINIC_OR_DEPARTMENT_OTHER)
Admission: EM | Admit: 2020-04-18 | Discharge: 2020-04-18 | Disposition: A | Discharge status: Home / Self Care | Payer: Medicaid Other | Attending: Emergency Medicine | Admitting: Emergency Medicine

## 2020-04-18 ENCOUNTER — Other Ambulatory Visit: Payer: Self-pay

## 2020-04-18 DIAGNOSIS — R202 Paresthesia of skin: Secondary | ICD-10-CM | POA: Diagnosis present

## 2020-04-18 DIAGNOSIS — J45909 Unspecified asthma, uncomplicated: Secondary | ICD-10-CM | POA: Insufficient documentation

## 2020-04-18 DIAGNOSIS — M79602 Pain in left arm: Secondary | ICD-10-CM | POA: Insufficient documentation

## 2020-04-18 DIAGNOSIS — Z87891 Personal history of nicotine dependence: Secondary | ICD-10-CM | POA: Insufficient documentation

## 2020-04-18 LAB — CBG MONITORING, ED: Glucose-Capillary: 78 mg/dL (ref 70–99)

## 2020-04-18 MED ORDER — BACLOFEN 10 MG PO TABS: 10.0000 mg | ORAL_TABLET | Freq: Three times a day (TID) | ORAL | 0 refills | Status: AC

## 2020-04-18 MED ORDER — PREDNISONE 10 MG PO TABS: 40.0000 mg | ORAL_TABLET | Freq: Every day | ORAL | 0 refills | Status: AC

## 2020-04-18 NOTE — Discharge Instructions (Addendum)
Your x-ray was negative for any fractures or lesions.  Please follow-up with neurology.  I given you the information for [facility] which is a neurology group in [place].  I also recommend following up with a primary care provider.  I have refilled the baclofen which is a muscle relaxer you have been on the past.  I have also prescribed you prednisone.  Please use Tylenol or ibuprofen for pain.  You may use 600 mg ibuprofen every 6 hours or 1000 mg of Tylenol every 6 hours.  You may choose to alternate between the 2.  This would be most effective.  Not to exceed 4 g of Tylenol within 24 hours.  Not to exceed 3200 mg ibuprofen 24 hours.

## 2020-04-18 NOTE — ED Provider Notes (Signed)
[facility] EMERGENCY DEPARTMENT Provider Note   CSN: [id number] Arrival date & time: 04/18/20  1604     History No chief complaint on file.   Tamara Garza is a 30 y.o. female.  HPI Patient is a 30 year old female with past medical history significant for asthma, depression, migraines, obesity  Patient is presented to the emergency department today with left arm paresthesias.  She describes it as burning tingling pain she states that she is has been ongoing issue since she was in high school/greater than 10 years ago.  She states that over the past several days she has had worsening episode and seems to be more consistent than has been in the past.  She does not have a primary care doctor has never been evaluated for this by a neurologist.  She denies any weakness or complete numbness.  She states that it feels like she has pins-and-needles in her hands she states this sensation reaches all the way up into her shoulder but does not radiate into her neck.  She denies any neck trauma or any back injuries or back pain.  She denies any weakness.  No other associated symptoms.  No aggravating mitigating factors.  She has tried no medications at home for her symptoms.      Past Medical History:  Diagnosis Date  . ACNE 04/04/2006   Qualifier: Diagnosis of  By: Abundio Miu    . Asthma   . ASTHMA, INTERMITTENT 04/04/2006   Qualifier: Diagnosis of  By: Lelon Perla MD, Vickki Muff    . Depression   . Gallstones 2011  . 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 Infection    UTI  . LGSIL (low grade squamous intraepithelial dysplasia)   . Migraine 11/21/2011  . Obesity   . RHINITIS, ALLERGIC 04/04/2006   Qualifier: Diagnosis of  By: Abundio Miu    . Vaginal Pap smear, abnormal    ok since colpo    Patient Active Problem List   Diagnosis Date Noted  . Thumb pain, left 05/02/2016  . Former smoker 01/23/2016  . Migraine 11/21/2011  . 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 Left breast mass 11/21/2011  . LGSIL on Pap smear of cervix   . Obesity 04/04/2006  . Hearing loss 04/04/2006  . RHINITIS, ALLERGIC 04/04/2006  . Asthma affecting pregnancy, antepartum 04/04/2006    Past Surgical History:  Procedure Laterality Date  . CHOLECYSTECTOMY    . COLPOSCOPY  2009     OB History    Gravida  2   Para  2   Term  2   Preterm  0   AB  0   Living  2     SAB  0   IAB  0   Ectopic  0   Multiple  0   Live Births  2           Family History  Problem Relation Age of Onset  . Multiple sclerosis Mother   . Diabetes Father   . Hypertension Father   . Asthma Father   . Breast cancer Maternal Grandmother   . Cancer Maternal Grandmother   . Hypertension Maternal Grandmother   . Dementia Maternal Grandfather     Social History   Tobacco Use  . Smoking status: Former Smoker    Packs/day: 0.25    Quit date: 2016    Years since quitting: 6.2  . Smokeless tobacco: Never Used  Vaping Use  . Vaping Use: Never  used  Substance Use Topics  . Alcohol use: No  . Drug use: No    Home Medications Prior to Admission medications   Medication Sig Start Date End Date Taking? Authorizing Provider  predniSONE (DELTASONE) 10 MG tablet Take 4 tablets (40 mg total) by mouth daily for 4 days. 04/18/20 04/22/20 Yes Fondaw, Wylder S, PA  baclofen (LIORESAL) 10 MG tablet Take 1 tablet (10 mg total) by mouth 3 (three) times daily for 7 days. 04/18/20 04/25/20  Gailen Shelter, PA    Allergies    Eggs or egg-derived products, Penicillins, Sulfa antibiotics, and Sulfonamide derivatives  Review of Systems   Review of Systems  Constitutional: Negative for chills and fever.  HENT: Negative for congestion.   Eyes: Negative for pain.  Respiratory: Negative for cough and shortness of breath.   Cardiovascular: Negative for chest pain and leg swelling.  Gastrointestinal: Negative for abdominal pain, diarrhea, nausea and vomiting.  Genitourinary: Negative  for dysuria.  Musculoskeletal: Negative for myalgias.  Skin: Negative for rash.  Neurological: Negative for dizziness and headaches.       Paresthesias     Physical Exam Updated Vital Signs BP 118/88 (BP Location: Right Arm)   Pulse 82   Temp 98.4 F (36.9 C) (Oral)   Resp 20   LMP 10/20/2019   SpO2 100%   Physical Exam Vitals and nursing note reviewed.  Constitutional:      General: She is not in acute distress.    Appearance: Normal appearance. She is not ill-appearing.     Comments: Patient is a pleasant 30 year old female no acute distress and comfortably in bed.  Able answer questions appropriately follow commands.  HENT:     Head: Normocephalic and atraumatic.  Eyes:     General: No scleral icterus.       Right eye: No discharge.        Left eye: No discharge.     Conjunctiva/sclera: Conjunctivae normal.  Pulmonary:     Effort: Pulmonary effort is normal.     Breath sounds: No stridor.  Musculoskeletal:     Comments: Bilateral grip strength is 5/5 in upper extremities.  Requires some pushing to squeeze bilateral hands given the pain secondary to moving left arm.  Good proprioception with finger-to-nose.  Good movement in general. No cervical spine tenderness palpation.  No masses palpated.  Full range of motion of neck.  There seems to be tenderness to palpation diffusely from fingertips all the way to the left shoulder.  Tenderness is diffuse and is associated with burning sensation.  Skin:    General: Skin is warm and dry.  Neurological:     Mental Status: She is alert and oriented to person, place, and time. Mental status is at baseline.  Psychiatric:        Mood and Affect: Mood normal.        Behavior: Behavior normal.     ED Results / Procedures / Treatments   Labs (all labs ordered are listed, but only abnormal results are displayed) Labs Reviewed  CBG MONITORING, ED    EKG None  Radiology DG Cervical Spine Complete  Result Date:  04/18/2020 CLINICAL DATA:  Arm paresthesia EXAM: CERVICAL SPINE - COMPLETE 4+ VIEW COMPARISON:  06/25/2011 FINDINGS: No subluxation. Vertebral body heights are maintained. Disc spaces appear within normal limits. Foramen appear grossly patent on oblique views. The dens and lateral masses are within normal limits. IMPRESSION: Negative. Electronically Signed   By: Adrian Prows.D.  On: 04/18/2020 18:44    Procedures Procedures   Medications Ordered in ED Medications - No data to display  ED Course  I have reviewed the triage vital signs and the nursing notes.  Pertinent labs & imaging results that were available during my care of the patient were reviewed by me and considered in my medical decision making (see chart for details).    Clinical Course as of 04/18/20 2014  Mon Apr 18, 2020  3875 Cervical spine x-ray negative for mass lesion or fracture. [WF]    Clinical Course User Index [WF] Gailen Shelter, [place]   MDM Rules/Calculators/A&P                          Patient will follow up with neurology.  Her symptoms been ongoing for many years.  Doubt an acute cause such as stroke.  Low suspicion for MS.  She understands importance of follow-up and was given return precautions. Will trial patient on prednisone and start her on baclofen for possibility of muscle spasm. Final Clinical Impression(s) / ED Diagnoses Final diagnoses:  Paresthesias  Left arm pain    Rx / DC Orders ED Discharge Orders         Ordered    predniSONE (DELTASONE) 10 MG tablet  Daily        04/18/20 1942    baclofen (LIORESAL) 10 MG tablet  3 times daily        04/18/20 1942           Gailen Shelter, [place] 04/19/20 1418    Pollyann Savoy, MD 04/22/20 [id number]

## 2020-04-18 NOTE — ED Triage Notes (Signed)
Pt states she has been having issues with pins and needles for years.  Pt states that it usually goes away but this time it has worsened.  Pt states she had some pain in her upper arm this am but now she is having some pins and needles.

## 2020-04-19 ENCOUNTER — Encounter: Payer: Self-pay | Admitting: Neurology

## 2020-07-15 ENCOUNTER — Other Ambulatory Visit: Payer: Self-pay

## 2020-07-15 ENCOUNTER — Encounter: Payer: Self-pay | Admitting: Neurology

## 2020-07-15 ENCOUNTER — Ambulatory Visit (INDEPENDENT_AMBULATORY_CARE_PROVIDER_SITE_OTHER): Payer: Medicaid Other | Admitting: Neurology

## 2020-07-15 VITALS — BP 111/77 | HR 84 | Ht 66.0 in | Wt 282.0 lb

## 2020-07-15 DIAGNOSIS — R202 Paresthesia of skin: Secondary | ICD-10-CM | POA: Diagnosis not present

## 2020-07-15 NOTE — Patient Instructions (Signed)
Nerve testing of the left arm  ELECTROMYOGRAM AND NERVE CONDUCTION STUDIES (EMG/NCS) INSTRUCTIONS  How to Prepare The neurologist conducting the EMG will need to know if you have certain medical conditions. Tell the neurologist and other EMG lab personnel if you: Have a pacemaker or any other electrical medical device Take blood-thinning medications Have hemophilia, a blood-clotting disorder that causes prolonged bleeding Bathing Take a shower or bath shortly before your exam in order to remove oils from your skin. Don't apply lotions or creams before the exam.  What to Expect You'll likely be asked to change into a hospital gown for the procedure and lie down on an examination table. The following explanations can help you understand what will happen during the exam.  Electrodes. The neurologist or a technician places surface electrodes at various locations on your skin depending on where you're experiencing symptoms. Or the neurologist may insert needle electrodes at different sites depending on your symptoms.  Sensations. The electrodes will at times transmit a tiny electrical current that you may feel as a twinge or spasm. The needle electrode may cause discomfort or pain that usually ends shortly after the needle is removed. If you are concerned about discomfort or pain, you may want to talk to the neurologist about taking a short break during the exam.  Instructions. During the needle EMG, the neurologist will assess whether there is any spontaneous electrical activity when the muscle is at rest - activity that isn't present in healthy muscle tissue - and the degree of activity when you slightly contract the muscle.  He or she will give you instructions on resting and contracting a muscle at appropriate times. Depending on what muscles and nerves the neurologist is examining, he or she may ask you to change positions during the exam.  After your EMG You may experience some temporary, minor  bruising where the needle electrode was inserted into your muscle. This bruising should fade within several days. If it persists, contact your primary care doctor.   

## 2020-07-15 NOTE — Progress Notes (Signed)
[facility] Neurology Division Clinic Note - Initial Visit   Date: 07/15/20  Tamara Garza MRN: [id number] DOB: March 21, 1990   Dear Dr. Bernette Mayers:  Thank you for your kind referral of Tamara Garza for consultation of left arm numbness. Although her history is well known to you, please allow [place] to reiterate it for the purpose of our medical record. The patient was accompanied to the clinic by self.    History of Present Illness: Tamara Garza is a 30 y.o. left-handed female with asthma, depression, and migraines presenting for evaluation of left arm numbness.   She has always had tingling/numbness of the left hand, but in March 2022, she developed numbness/tingling involving the entire left arm.  Symptoms remained constant for a month and then self-resolved.  She occasionally drops her phone.  No neck pain or arm pain. Any movement of the hand would make her pain worse.  No similar symptoms in the right hand. She tried ibuprofen.   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   Out-side paper records, electronic medical record, and images have been reviewed where available and summarized as:  Lab Results  Component Value Date   HGBA1C 5.4 04/08/2017   No results found for: [id number] Lab Results  Component Value Date   TSH 1.102 09/30/2014   No results found for: Maudie Flakes  Past Medical History:  Diagnosis Date   ACNE 04/04/2006   Qualifier: Diagnosis of  By: Abundio Miu     Asthma    ASTHMA, INTERMITTENT 04/04/2006   Qualifier: Diagnosis of  By: Lelon Perla MD, [place]     Depression    Gallstones 2011   High risk HPV infection 2009   Infection    UTI   LGSIL (low grade squamous intraepithelial dysplasia)    Migraine 11/21/2011   Obesity    RHINITIS, ALLERGIC 04/04/2006   Qualifier: Diagnosis of  By: Abundio Miu     Vaginal Pap smear, abnormal    ok since colpo    Past Surgical History:   Procedure Laterality Date   CHOLECYSTECTOMY     COLPOSCOPY  2009     Medications:  No outpatient encounter medications on file as of 07/15/2020.   No facility-administered encounter medications on file as of 07/15/2020.    Allergies:  Allergies  Allergen Reactions   Eggs Or Egg-Derived Products Swelling and Itching   Penicillins Rash and Other (See Comments)    Has patient had a PCN reaction causing immediate rash, facial/tongue/throat swelling, SOB or lightheadedness with hypotension: unknown Has patient had a PCN reaction causing severe rash involving mucus membranes or skin necrosis: unknown Has patient had a PCN reaction that required hospitalization unknown Has patient had a PCN reaction occurring within the last 10 years: no If all of the above answers are "NO", then may proceed with Cephalosporin use.    Sulfa Antibiotics Other (See Comments)   Sulfonamide Derivatives Rash    Family History: Family History  Problem Relation Age of Onset   Multiple sclerosis Mother    Osteoporosis Mother    Diabetes Father    Hypertension Father    Asthma Father    Breast cancer Maternal Grandmother    Cancer Maternal Grandmother    Hypertension Maternal Grandmother    Dementia Maternal Grandfather     Social History: Social History   Tobacco Use   Smoking status: Former    Packs/day: 0.25    Pack years: 0.00  Types: Cigarettes    Quit date: 2016    Years since quitting: 6.4   Smokeless tobacco: Never  Vaping Use   Vaping Use: Never used  Substance Use Topics   Alcohol use: No   Drug use: No   Social History   Social History Narrative   Living with 2 kids   Left hand   Drink coffee occasion    Vital Signs:  BP 111/77   Pulse 84   Ht 5\' 6"  (1.676 m)   Wt 282 lb (127.9 kg)   SpO2 99%   BMI 45.52 kg/m    Neurological Exam: MENTAL STATUS including orientation to time, place, person, recent and remote memory, attention span and concentration, language,  and fund of knowledge is normal.  Speech is not dysarthric.  CRANIAL NERVES: II:  No visual field defects.   III-IV-VI: Pupils equal round and reactive to light.  Normal conjugate, extra-ocular eye movements in all directions of gaze.  No nystagmus.  No ptosis.   V:  Normal facial sensation.    VII:  Normal facial symmetry and movements.   VIII:  Normal hearing and vestibular function.   IX-X:  Normal palatal movement.   XI:  Normal shoulder shrug and head rotation.   XII:  Normal tongue strength and range of motion, no deviation or fasciculation.  MOTOR:  Motor strength is 5/5 throughout, intermittent give-way weakness in the LUE. No atrophy, fasciculations or abnormal movements.  No pronator drift.   MSRs:  Right        Left                  brachioradialis 2+  2+  biceps 2+  2+  triceps 2+  2+  patellar 2+  2+  ankle jerk 2+  2+  Hoffman no  no  plantar response down  down   SENSORY:  Normal and symmetric perception of light touch, pinprick, vibration, and proprioception.  Romberg's sign absent. Tinel's sign is absent at the wrists  COORDINATION/GAIT: Normal finger-to- nose-finger.  Intact rapid alternating movements bilaterally.  Gait narrow based and stable. Tandem and stressed gait intact.    IMPRESSION: Left arm paresthesias and intermittent weakness. Her neurological exam is non-focal and reassuring.  - NCS/EMG of the left arm will be ordered to help localize symptoms  Further recommendations pending results.   Thank you for allowing me to participate in patient's care.  If I can answer any additional questions, I would be pleased to do so.    Sincerely,    Melia Hopes K. , DO

## 2020-08-24 ENCOUNTER — Ambulatory Visit: Payer: Medicaid Other | Admitting: Neurology

## 2020-08-24 ENCOUNTER — Other Ambulatory Visit: Payer: Self-pay

## 2020-08-24 DIAGNOSIS — R202 Paresthesia of skin: Secondary | ICD-10-CM

## 2020-08-24 NOTE — Procedures (Signed)
[facility] Neurology  [address] [place], [address]  [place], [place] [address] Tel: [phone] Fax:  [phone] Test Date:  08/24/2020  Patient: Tamara Garza DOB: 11-Aug-1990 Physician: Nita Sickle, DO  Sex: Female Height: 5\' 6"  Ref Phys: , DO  ID#: Nita Sickle   Technician:    Patient Complaints: This is a 30 year old female referred for evaluation of left arm paresthesias.  NCV & EMG Findings: Extensive electrodiagnostic testing of the left upper extremity shows: Left median, ulnar, and mixed palmar sensory responses are within normal limits. Left median and ulnar motor responses are within normal limits. There is no evidence of active or chronic motor axonal loss changes affecting any of the tested muscles.  Motor unit configuration and recruitment pattern is within normal limits.  Impression: This is a normal study of the left upper extremity.  In particular, there is no evidence of carpal tunnel syndrome or cervical radiculopathy.   ___________________________ 37, DO    Nerve Conduction Studies Anti Sensory Summary Table   Stim Site NR Peak (ms) Norm Peak (ms) P-T Amp (V) Norm P-T Amp  Left Median Anti Sensory (2nd Digit)  33C  Wrist    2.6 <3.3 59.3 >20  Left Ulnar Anti Sensory (5th Digit)  33C  Wrist    2.6 <3.0 43.1 >18   Motor Summary Table   Stim Site NR Onset (ms) Norm Onset (ms) O-P Amp (mV) Norm O-P Amp Site1 Site2 Delta-0 (ms) Dist (cm) Vel (m/s) Norm Vel (m/s)  Left Median Motor (Abd Poll Brev)  33C  Wrist    2.7 <3.9 13.3 >6 Elbow Wrist 4.6 28.0 61 >51  Elbow    7.3  12.8         Left Ulnar Motor (Abd Dig Minimi)  33C  Wrist    2.2 <3.0 10.6 >8 B Elbow Wrist 3.4 22.0 65 >51  B Elbow    5.6  9.8  A Elbow B Elbow 1.7 10.0 59 >51  A Elbow    7.3  9.6          Comparison Summary Table   Stim Site NR Peak (ms) Norm Peak (ms) P-T Amp (V) Site1 Site2 Delta-P (ms) Norm Delta (ms)  Left Median/Ulnar Palm Comparison (Wrist -  8cm)  33C  Median Palm    1.4 <2.2 87.6 Median Palm Ulnar Palm 0.0   Ulnar Palm    1.4 <2.2 12.7       EMG   Side Muscle Ins Act Fibs Psw Fasc Number Recrt Dur Dur. Amp Amp. Poly Poly. Comment  Left 1stDorInt Nml Nml Nml Nml Nml Nml Nml Nml Nml Nml Nml Nml N/A  Left PronatorTeres Nml Nml Nml Nml Nml Nml Nml Nml Nml Nml Nml Nml N/A  Left Biceps Nml Nml Nml Nml Nml Nml Nml Nml Nml Nml Nml Nml N/A  Left Triceps Nml Nml Nml Nml Nml Nml Nml Nml Nml Nml Nml Nml N/A  Left Deltoid Nml Nml Nml Nml Nml Nml Nml Nml Nml Nml Nml Nml N/A      Waveforms:

## 2020-08-25 ENCOUNTER — Telehealth: Payer: Self-pay | Admitting: *Deleted

## 2020-08-25 DIAGNOSIS — R200 Anesthesia of skin: Secondary | ICD-10-CM

## 2020-08-25 NOTE — Telephone Encounter (Signed)
MRI brain w w/o order as instructed.

## 2020-08-25 NOTE — Telephone Encounter (Signed)
-----   Message from Glendale Chard, DO sent at 08/24/2020  4:21 PM EDT ----- Patient made aware of normal EMG results.  She is very concerned about multiple sclerosis and would like an MRI brain.  Please order MRI brain wwo contrast. Dx:  left arm numbness, evaluate for demyelinating disease. Thanks.

## 2020-09-11 ENCOUNTER — Other Ambulatory Visit: Payer: Medicaid Other

## 2020-09-17 ENCOUNTER — Ambulatory Visit
Admission: RE | Admit: 2020-09-17 | Discharge: 2020-09-17 | Disposition: A | Discharge status: Home / Self Care | Payer: Medicaid Other | Source: Ambulatory Visit | Attending: Neurology | Admitting: Neurology

## 2020-09-17 ENCOUNTER — Other Ambulatory Visit: Payer: Self-pay

## 2020-09-17 DIAGNOSIS — R200 Anesthesia of skin: Secondary | ICD-10-CM

## 2020-09-17 MED ORDER — GADOBENATE DIMEGLUMINE 529 MG/ML IV SOLN
20.0000 mL | Freq: Once | INTRAVENOUS | Status: AC | PRN
  Administered 2020-09-17: 20 mL via INTRAVENOUS

## 2020-09-19 ENCOUNTER — Telehealth: Payer: Self-pay | Admitting: Neurology

## 2020-09-19 NOTE — Telephone Encounter (Signed)
Pt would like the test results from the MRI  please call

## 2020-09-20 NOTE — Telephone Encounter (Signed)
Patient MRI results:  Please let pt know that her MRI shows some changes which may explains her left arm numbness.  I'd like to review this with her in the office.  OK to add this Thursday 8/18 at 8:50a. Thanks.  Called patient and informed her of results. Patient is ok with that appointment time and would like it. Informed patient I would get her scheduled.

## 2020-09-21 NOTE — Progress Notes (Signed)
Patient has been notified of results and has been schduled for 8/18 with Dr. Allena Katz to go over results in office with Dr. Allena Katz.

## 2020-09-21 NOTE — Progress Notes (Signed)
Follow-up Visit   Date: 09/22/20   Tamara Garza MRN: [id number] DOB: 1990-10-25   Interim History: Tamara Garza is a 30 y.o. left-handed African American female with asthma, depression, and migraines returning to the clinic for follow-up of left arm numbness and abnormal MRI brain.  The patient was accompanied to the clinic by self.  History of present illness: She has always had tingling/numbness of the left hand, but in March 2022, she developed numbness/tingling involving the entire left arm.  Symptoms remained constant for a month and then self-resolved.  She occasionally drops her phone.  No neck pain or arm pain. Any movement of the hand would make her pain worse.  No similar symptoms in the right hand. She tried ibuprofen.    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   UPDATE 09/22/2020: She is her for follow-up visit to discuss the results of her MRI brain which shows white matter changes, consistent with demyelinating disease.  She does not have any new neurological complaints today.   No vision changes, numbness, tingling, or weakness.   Her mother was diagnosed with MS at the age of 4 and passed at 62 with pneumonia. She was wheelchair-bound.  Maternal aunt also with MS diagnosed ~ 30s and currently wheelchair-bound in her 11s.   Medications:  Current Outpatient Medications on File Prior to Visit  Medication Sig Dispense Refill   adapalene (DIFFERIN) 0.1 % gel Apply 1 application topically at bedtime.     albuterol (VENTOLIN HFA) 108 (90 Base) MCG/ACT inhaler Inhale 1 puff into the lungs every 6 (six) hours as needed.     cyclobenzaprine (FLEXERIL) 5 MG tablet Take 5-10 mg by mouth every 8 (eight) hours as needed.     No current facility-administered medications on file prior to visit.    Allergies:  Allergies  Allergen Reactions   Eggs Or Egg-Derived Products Swelling and Itching   Penicillins Rash and Other (See  Comments)    Has patient had a PCN reaction causing immediate rash, facial/tongue/throat swelling, SOB or lightheadedness with hypotension: unknown Has patient had a PCN reaction causing severe rash involving mucus membranes or skin necrosis: unknown Has patient had a PCN reaction that required hospitalization unknown Has patient had a PCN reaction occurring within the last 10 years: no If all of the above answers are "NO", then may proceed with Cephalosporin use.    Sulfa Antibiotics Other (See Comments)   Multihance [Gadobenate] Nausea And Vomiting    Nausea and vomiting after contrast   Sulfonamide Derivatives Rash    Vital Signs:  BP 129/85 (BP Location: Left Arm, Patient Position: Sitting, Cuff Size: Large)   Pulse 95   Ht 5\' 6"  (1.676 m)   Wt 275 lb (124.7 kg)   SpO2 100%   BMI 44.39 kg/m   Neurological Exam: MENTAL STATUS including orientation to time, place, person, recent and remote memory, attention span and concentration, language, and fund of knowledge is normal.  Speech is not dysarthric.  CRANIAL NERVES:  No visual field defects.  Pupils equal round and reactive to light.  Normal conjugate, extra-ocular eye movements in all directions of gaze.  No ptosis.  Face is symmetric. Palate elevates symmetrically.  Tongue is midline.  MOTOR:  Motor strength is 5/5 in all extremities.  No atrophy, fasciculations or abnormal movements.  No pronator drift.  Tone is normal.    MSRs:  Reflexes are 2+/4 throughout.  SENSORY:  Intact to vibration throughout.  COORDINATION/GAIT:  Normal finger-to- nose-finger.  Intact rapid alternating movements bilaterally.  Gait narrow based and stable.   Data: NCS/EMG of the left arm 08/24/2020:  normal  MRI brain wwo contrast 09/18/2020: Numerous foci of abnormal T2 and FLAIR signal affecting the cerebral hemispheric white matter. Most extensive involvement is in the deep periventricular white matter, many lesions showing a perpendicular  orientation to the lateral ventricles. Patient does have numerous foci of subcortical white matter disease in both hemispheres. Pattern is quite likely to represent demyelinating disease/multiple sclerosis. No lesions show restricted diffusion or contrast enhancement.    IMPRESSION/PLAN: Relapsing-remitting multiple sclerosis - new diagnosis based on MRI findings which shows diffuse subcortical and periventricular white matter changes. No active lesions. Imaging findings were viewed with patient. I discussed the diagnosis and management options with disease modifying therapy.  It is interesting that her mother and maternal aunt also have multiple sclerosis.  PLAN/RECOMMENDATIONS:  MRI cervical spine wwo contrast to assess disease burden Check CBC, CMP, TSH, vitamin D Start PA for Vumerity Start vitamin D 5000 IU daily Recommend annual eye exam - she is due for this Refer to [facility]   Return to clinic in 3 months.   Total time spent reviewing records, interview, history/exam, documentation, and coordination of care on day of encounter:  40 min    Thank you for allowing me to participate in patient's care.  If I can answer any additional questions, I would be pleased to do so.    Sincerely,    Holdyn Poyser K. Allena Katz, DO

## 2020-09-22 ENCOUNTER — Other Ambulatory Visit (INDEPENDENT_AMBULATORY_CARE_PROVIDER_SITE_OTHER): Payer: Medicaid Other

## 2020-09-22 ENCOUNTER — Encounter: Payer: Self-pay | Admitting: Neurology

## 2020-09-22 ENCOUNTER — Ambulatory Visit: Payer: Medicaid Other | Admitting: Neurology

## 2020-09-22 ENCOUNTER — Other Ambulatory Visit: Payer: Self-pay

## 2020-09-22 VITALS — BP 129/85 | HR 95 | Ht 66.0 in | Wt 275.0 lb

## 2020-09-22 DIAGNOSIS — G35 Multiple sclerosis: Secondary | ICD-10-CM

## 2020-09-22 LAB — CBC WITH DIFFERENTIAL/PLATELET
Basophils Absolute: 0 10*3/uL (ref 0.0–0.1)
Basophils Relative: 0.6 % (ref 0.0–3.0)
Eosinophils Absolute: 0.1 10*3/uL (ref 0.0–0.7)
Eosinophils Relative: 2.4 % (ref 0.0–5.0)
HCT: 37.1 % (ref 36.0–46.0)
Hemoglobin: 11.9 g/dL — ABNORMAL LOW (ref 12.0–15.0)
Lymphocytes Relative: 43 % (ref 12.0–46.0)
Lymphs Abs: 2.2 10*3/uL (ref 0.7–4.0)
MCHC: 32 g/dL (ref 30.0–36.0)
MCV: 85 fl (ref 78.0–100.0)
Monocytes Absolute: 0.4 10*3/uL (ref 0.1–1.0)
Monocytes Relative: 8.6 % (ref 3.0–12.0)
Neutro Abs: 2.4 10*3/uL (ref 1.4–7.7)
Neutrophils Relative %: 45.4 % (ref 43.0–77.0)
Platelets: 227 10*3/uL (ref 150.0–400.0)
RBC: 4.37 Mil/uL (ref 3.87–5.11)
RDW: 14.8 % (ref 11.5–15.5)
WBC: 5.2 10*3/uL (ref 4.0–10.5)

## 2020-09-22 LAB — COMPREHENSIVE METABOLIC PANEL
ALT: 17 U/L (ref 0–35)
AST: 16 U/L (ref 0–37)
Albumin: 3.9 g/dL (ref 3.5–5.2)
Alkaline Phosphatase: 66 U/L (ref 39–117)
BUN: 7 mg/dL (ref 6–23)
CO2: 28 mEq/L (ref 19–32)
Calcium: 9.5 mg/dL (ref 8.4–10.5)
Chloride: 103 mEq/L (ref 96–112)
Creatinine, Ser: 0.78 mg/dL (ref 0.40–1.20)
GFR: 102.42 mL/min (ref >60.00)
Glucose, Bld: 92 mg/dL (ref 70–99)
Potassium: 4.5 mEq/L (ref 3.5–5.1)
Sodium: 138 mEq/L (ref 135–145)
Total Bilirubin: 0.2 mg/dL (ref 0.2–1.2)
Total Protein: 7.4 g/dL (ref 6.0–8.3)

## 2020-09-22 LAB — TSH: TSH: 1.28 u[IU]/mL (ref 0.35–5.50)

## 2020-09-22 LAB — VITAMIN D 25 HYDROXY (VIT D DEFICIENCY, FRACTURES): VITD: 11.99 ng/mL — ABNORMAL LOW (ref 30.00–100.00)

## 2020-09-22 NOTE — Patient Instructions (Addendum)
Recommend eye exam  Check labs  MRI cervical spine wwo contrast  Start vitamin D 5000 IU daily  [email or web address]  We will start authorization for Vumerity  We will refer you to [facility]   Return to clinic 3 months

## 2020-09-27 ENCOUNTER — Telehealth: Payer: Self-pay | Admitting: Neurology

## 2020-09-27 NOTE — Telephone Encounter (Signed)
[facility] called to verify prescription for Vumerity.

## 2020-09-27 NOTE — Telephone Encounter (Signed)
Pt called to inform patel that her pharmacy said she needs to start a PA for her vumerity

## 2020-09-27 NOTE — Telephone Encounter (Signed)
F/u   Awaiting signature from your patient.  Key: [id number]) VUMERITY 231MG  delayed-release capsules  Form VUMERITY New Patient Start Form Enrollment Form Created 27 minutes ago Submitted by Provider less than a minute ago Emailed Patient for Signature less than a minute ago Patient Consented Sent to to [email or web address] for signature.

## 2020-09-29 NOTE — Telephone Encounter (Signed)
F/U   Approve   Message from Plan  PA Case: [id number],   Status: Approved, Coverage Starts on: 09/27/2020 12:00:00 AM, Coverage Ends on: 09/27/2021 12:00:00 AM.

## 2020-10-03 ENCOUNTER — Other Ambulatory Visit: Payer: Self-pay

## 2020-10-03 ENCOUNTER — Ambulatory Visit
Admission: RE | Admit: 2020-10-03 | Discharge: 2020-10-03 | Disposition: A | Discharge status: Home / Self Care | Payer: Medicaid Other | Source: Ambulatory Visit | Attending: Neurology | Admitting: Neurology

## 2020-10-03 DIAGNOSIS — G35 Multiple sclerosis: Secondary | ICD-10-CM

## 2020-10-03 MED ORDER — GADOBUTROL 1 MMOL/ML IV SOLN
10.0000 mL | Freq: Once | INTRAVENOUS | Status: AC | PRN
  Administered 2020-10-03: 10 mL via INTRAVENOUS

## 2020-10-05 ENCOUNTER — Telehealth: Payer: Self-pay | Admitting: Neurology

## 2020-10-05 ENCOUNTER — Other Ambulatory Visit: Payer: Self-pay

## 2020-10-05 DIAGNOSIS — G35 Multiple sclerosis: Secondary | ICD-10-CM

## 2020-10-05 NOTE — Telephone Encounter (Signed)
Patient called back in wanting to get clarification on something with her MRI results she just got.

## 2020-10-05 NOTE — Telephone Encounter (Signed)
Please inform pt that her MRI cervical spine shows scattered MS lesions in the cord, none which are active.  Seeing that there are changes in the cervical region (upper neck), I recommend also looking at the mid-back with MRI thoracic spine wwo contrast. This is to get baseline imaging to see if there are any changes at this level.  With respect to her medication options, we last discussed about Vumerity, but seeing that she does have changes in her cord, Donnamarie Poag is a better option.  It's once a month IV infusion and is the most effective medication.  The reason for choosing this is to minimize disease progression especially in the cord.  We can set up a follow-up, if she would like to discuss this further.

## 2020-10-05 NOTE — Telephone Encounter (Signed)
Pt would like a call to discuss her results

## 2020-10-05 NOTE — Telephone Encounter (Signed)
Called patient and informed her of Cervical MRI results and recommendations from Dr. Allena Katz. Patient verbalized understanding and is ok with MRI of Thoracic spine being ordered. Patient had no other questions or concerns.

## 2020-10-11 ENCOUNTER — Encounter: Payer: Self-pay | Admitting: Neurology

## 2020-10-11 ENCOUNTER — Other Ambulatory Visit: Payer: Self-pay

## 2020-10-11 ENCOUNTER — Telehealth (INDEPENDENT_AMBULATORY_CARE_PROVIDER_SITE_OTHER): Payer: Medicaid Other | Admitting: Neurology

## 2020-10-11 VITALS — Ht 66.0 in

## 2020-10-11 DIAGNOSIS — G35 Multiple sclerosis: Secondary | ICD-10-CM | POA: Diagnosis not present

## 2020-10-11 NOTE — Telephone Encounter (Signed)
Patient has been added to Dr. Eliane Decree schedule today for VV.

## 2020-10-11 NOTE — Telephone Encounter (Signed)
This is best to discuss with a visit.  Can you see if she is available to do a VV or telephone today at 12:30p?

## 2020-10-11 NOTE — Progress Notes (Signed)
   Virtual Visit via Video Note The purpose of this virtual visit is to provide medical care while limiting exposure to the novel coronavirus.    Consent was obtained for video visit:  Yes.   Answered questions that patient had about telehealth interaction:  Yes.   I discussed the limitations, risks, security and privacy concerns of performing an evaluation and management service by telemedicine. I also discussed with the patient that there may be a patient responsible charge related to this service. The patient expressed understanding and agreed to proceed.  Pt location: Home Physician Location: office Name of referring provider:  Maud Deed, [place] I connected with Tamara Garza at patients initiation/request on 10/11/2020 at 12:15 PM EDT by video enabled telemedicine application and verified that I am speaking with the correct person using two identifiers. Pt MRN:  [id number] Pt DOB:  Oct 22, 1990 Video Participants:  Tamara Garza   History of Present Illness: This is a 30 y.o. female returning for follow-up of new diagnosis of relasping remitting multiple sclerosis.  She started the first week of Vumerity titration last week and when she received information that her imaging also shows cervical cord involvement and that I recommend switching to Tysabri, she mistakenly stopped her Vumerity and is inquiring how to restart this.  She also would like to review her MRI cervical spine and would like a copy of the images.  She has many questions which were addressed.   Observations/Objective:   Vitals:   10/11/20 1150  Height: 5\' 6"  (1.676 m)   Patient is awake, alert, and appears comfortable.     Extraocular muscles are intact. No ptosis.  Face is symmetric.  Speech is not dysarthric. Antigravity in all extremities.   Assessment and Plan:  Relapsing remitting multiple sclerosis - new diagnosis.  Imaging with disease involvement in the brain and cervical spine, no active disease is seen.  Imaging was reviewed with patient through shared screen viewing.  I informed her to contact [facility] to request copy of images on CD. Imaging of the thoracic spine has been ordered to assess disease burden. Given her cord involvement, I recommend switching from Vumerity to Tysabri.  For now, she will continue titration of Vumerity while we also start PA for Tysabri. She will need JCV titer.  Medication counseling was given. We will transition her to Tysabri, once she is approved. We will follow-up with her referral to [facility].   Follow Up Instructions:   I discussed the assessment and treatment plan with the patient. The patient was provided an opportunity to ask questions and all were answered. The patient agreed with the plan and demonstrated an understanding of the instructions.   The patient was advised to call back or seek an in-person evaluation if the symptoms worsen or if the condition fails to improve as anticipated.    [facility], DO

## 2020-10-11 NOTE — Telephone Encounter (Signed)
Called patient and she had additional questions in regards to her Ms, MRI, and Medication.  Patient would like to know if her MS is active? Has she relapse? She stated she thought she was in remission. Patient would like to know if we can upload the actual images to her Mychart? She would like to see the actual images and location of the New lesions. Patient stated that she thought she had to stop her Vumerity when we called with results so she has not taken her medications since last Wednesday. I informed patient that we DID NOT tell her to stop her medication and that we were offering another option of Tysarbi. Patient stated she didn't know if she needed to stop it so that it could be out of her system for the new medication. I informed patient that we were offering the option to her and that she was supposed to let us know if she wanted to move forward with medication. Patient would like to know if she can just start taking the Vumerity like normal?      Patient requested a call back from Dr. Allena Katz, but I informed her that Dr. Allena Katz is busy with patients today, but I would relay the message to her.

## 2020-10-12 ENCOUNTER — Other Ambulatory Visit (INDEPENDENT_AMBULATORY_CARE_PROVIDER_SITE_OTHER): Payer: Medicaid Other

## 2020-10-12 ENCOUNTER — Other Ambulatory Visit: Payer: Self-pay

## 2020-10-12 DIAGNOSIS — G35 Multiple sclerosis: Secondary | ICD-10-CM | POA: Diagnosis not present

## 2020-10-12 DIAGNOSIS — Z79899 Other long term (current) drug therapy: Secondary | ICD-10-CM

## 2020-10-14 ENCOUNTER — Telehealth: Payer: Self-pay

## 2020-10-14 NOTE — Telephone Encounter (Signed)
-----   Message from Glendale Chard, DO sent at 10/11/2020  2:08 PM EDT ----- Please initiate start form for Tysabri.  She will need to sign some forms and need JCV antibody titer checked.  Please also follow-up on [facility] referral.  Thanks.

## 2020-10-14 NOTE — Telephone Encounter (Signed)
Called [facility] at [phone] to follow up on patients referral. On hold for 15 minutes and had to hang up due to being needed on floor.

## 2020-10-19 LAB — STRATIFY JCV AB (W/ INDEX) W/ RFLX
Index Value: 0.16
Stratify JCV (TM) Ab w/Reflex Inhibition: NEGATIVE

## 2020-10-30 ENCOUNTER — Other Ambulatory Visit: Payer: Self-pay

## 2020-10-30 ENCOUNTER — Ambulatory Visit
Admission: RE | Admit: 2020-10-30 | Discharge: 2020-10-30 | Disposition: A | Discharge status: Home / Self Care | Payer: Medicaid Other | Source: Ambulatory Visit | Attending: Neurology | Admitting: Neurology

## 2020-10-30 DIAGNOSIS — G35 Multiple sclerosis: Secondary | ICD-10-CM

## 2020-10-30 MED ORDER — GADOBUTROL 1 MMOL/ML IV SOLN
10.0000 mL | Freq: Once | INTRAVENOUS | Status: AC | PRN
  Administered 2020-10-30: 10 mL via INTRAVENOUS

## 2020-11-01 ENCOUNTER — Telehealth: Payer: Self-pay

## 2020-11-01 ENCOUNTER — Telehealth: Payer: Medicaid Other | Admitting: Pharmacy Technician

## 2020-11-01 DIAGNOSIS — G35 Multiple sclerosis: Secondary | ICD-10-CM

## 2020-11-01 NOTE — Telephone Encounter (Signed)
Orders placed for Tysabri.

## 2020-11-01 NOTE — Telephone Encounter (Signed)
Dr. Allena Katz,  Auth Submission: NO AUTH REQUIRED - TYSABRI Payer:  [place] MEDICAID Medication & CPT/J Code(s) submitted: Tysabri (Natalizumab) [phone] Route of submission (phone, fax, portal): PHONE [phone] Auth type: Buy/Bill Units/visits requested: 6 (6 MONTH TRACKING) Reference number: [id number] Approval from: 11/01/20 to 05/01/20   No co-insurance Co-pay $4  Patient will be scheduled as soon as possible.

## 2020-11-01 NOTE — Telephone Encounter (Signed)
Tamara Garza. She informed me that [facility] is needing Infusion Orders and a Referral sent over to them at Fax#: [phone].

## 2020-11-03 ENCOUNTER — Telehealth: Payer: Self-pay | Admitting: Neurology

## 2020-11-03 NOTE — Telephone Encounter (Signed)
Patient called and said [facility], where she was referred to,  will need all three recent MRI's sent to them.  She has an appointment on Monday, 11/07/20, with Allena Earing, MD.

## 2020-11-03 NOTE — Telephone Encounter (Signed)
MRI reports have been faxed to [facility]. Patient has been notified through Mychart.

## 2020-11-04 ENCOUNTER — Telehealth: Payer: Self-pay | Admitting: Pharmacy Technician

## 2020-11-04 NOTE — Telephone Encounter (Signed)
Mahina,  Can you please provide any information you have in regard to the TYSABRI FREE DRUG PROGRAM (name of pharmacy/co, phone number and patient ID#)  I reached out to [employer], but they were very limited on the information they could give.  Thanks Selena Batten

## 2020-11-08 ENCOUNTER — Encounter: Payer: Self-pay | Admitting: Neurology

## 2020-11-08 ENCOUNTER — Other Ambulatory Visit (HOSPITAL_COMMUNITY): Payer: Self-pay

## 2020-11-08 NOTE — Telephone Encounter (Signed)
(  Fyi notes)  Patient is NOT currently enrolled in [employer] PATIENT [employer]IST[employer].  Left v/m with patient to initiate process. (Patient must initiate process).  I will begin the process with [facility] as well. Awaiting call back from patient.  Rep# [id number] Rep# [id number]

## 2020-11-08 NOTE — Telephone Encounter (Signed)
Mahina, Currently working on this patient now.  I will let you know if there's anything I may need.  Thanks Selena Batten

## 2020-11-14 ENCOUNTER — Other Ambulatory Visit: Payer: Self-pay

## 2020-11-14 ENCOUNTER — Telehealth: Payer: Self-pay | Admitting: Neurology

## 2020-11-14 DIAGNOSIS — G35 Multiple sclerosis: Secondary | ICD-10-CM

## 2020-11-14 MED ORDER — SODIUM CHLORIDE 0.9 % IV SOLN: 300.0000 mg | INTRAVENOUS | Status: AC

## 2020-11-14 NOTE — Telephone Encounter (Signed)
Let's first try [facility] for the infusion.

## 2020-11-14 NOTE — Telephone Encounter (Signed)
Pt called in stating she was supposed to get an infusion of Tysabri, but [facility] denied the "financial burden". She would like the referral to be sent to [facility] to have it done.

## 2020-11-15 NOTE — Telephone Encounter (Signed)
Orders have been sent to [facility] for Tysabri Infusions. Patient notified via Mychart

## 2020-11-18 NOTE — Telephone Encounter (Signed)
F/u   Received paperwork from [facility] .  Denied your request Tysabri 300 mg /  15 ml vial .

## 2020-12-26 ENCOUNTER — Other Ambulatory Visit: Payer: Self-pay

## 2020-12-26 ENCOUNTER — Ambulatory Visit: Payer: Medicaid Other | Admitting: Neurology

## 2020-12-26 ENCOUNTER — Encounter: Payer: Self-pay | Admitting: Neurology

## 2020-12-26 VITALS — BP 123/77 | HR 86 | Ht 66.0 in | Wt 262.0 lb

## 2020-12-26 DIAGNOSIS — G35 Multiple sclerosis: Secondary | ICD-10-CM

## 2020-12-26 NOTE — Progress Notes (Signed)
Follow-up Visit   Date: 12/26/20   Tamara Garza MRN: [id number] DOB: [birth year]   Interim History: Tamara Garza is a 30 y.o. left-handed African American female with asthma, depression, and migraines returning to the clinic for follow-up of left arm numbness and abnormal MRI brain.  The patient was accompanied to the clinic by self.  History of present illness: She has always had tingling/numbness of the left hand, but in March 2022, she developed numbness/tingling involving the entire left arm.  Symptoms remained constant for a month and then self-resolved.  She occasionally drops her phone.  No neck pain or arm pain. Any movement of the hand would make her pain worse.  No similar symptoms in the right hand. She tried ibuprofen.    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   UPDATE 09/22/2020: She is her for follow-up visit to discuss the results of her MRI brain which shows white matter changes, consistent with demyelinating disease.  She does not have any new neurological complaints today.   No vision changes, numbness, tingling, or weakness.   Her mother was diagnosed with MS at the age of 36 and passed at 31 with pneumonia. She was wheelchair-bound.  Maternal aunt also with MS diagnosed ~ 30s and currently wheelchair-bound in her 64s.   UPDATE 12/26/2020: She is here for follow-up visit. She saw [facility] who also started PA for Tysabri. She will be started infusion on 12/7 at [facility].  In September/October, she began having numbness/tingling of the left hand which lasted about a week. She had one 15-min episode of blurred vision.  She will follow-up at [facility] in May. No new complaints today. 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   Medications:  Current Outpatient Medications on File Prior to Visit  Medication Sig Dispense Refill   adapalene (DIFFERIN) 0.1 % gel Apply 1  application topically at bedtime.     albuterol (VENTOLIN HFA) 108 (90 Base) MCG/ACT inhaler Inhale 1 puff into the lungs every 6 (six) hours as needed.     cyclobenzaprine (FLEXERIL) 5 MG tablet Take 5-10 mg by mouth every 8 (eight) hours as needed.     VUMERITY 231 MG CPDR Take by mouth.     Current Facility-Administered Medications on File Prior to Visit  Medication Dose Route Frequency Provider Last Rate Last Admin   natalizumab (TYSABRI) 300 mg in sodium chloride 0.9 % 100 mL IVPB  300 mg Intravenous Q28 days Nita Sickle K, DO        Allergies:  Allergies  Allergen Reactions   Eggs Or Egg-Derived Products Swelling and Itching   Penicillins Rash and Other (See Comments)    Has patient had a PCN reaction causing immediate rash, facial/tongue/throat swelling, SOB or lightheadedness with hypotension: unknown Has patient had a PCN reaction causing severe rash involving mucus membranes or skin necrosis: unknown Has patient had a PCN reaction that required hospitalization unknown Has patient had a PCN reaction occurring within the last 10 years: no If all of the above answers are "NO", then may proceed with Cephalosporin use.    Sulfa Antibiotics Other (See Comments)   Multihance [Gadobenate] Nausea And Vomiting    Nausea and vomiting after contrast   Sulfonamide Derivatives Rash    Vital Signs:  BP 123/77   Pulse 86   Ht 5\' 6"  (1.676 m)   Wt 262 lb (118.8 kg)  SpO2 100%   BMI 42.29 kg/m   Neurological Exam: MENTAL STATUS including orientation to time, place, person, recent and remote memory, attention span and concentration, language, and fund of knowledge is normal.  Speech is not dysarthric.  CRANIAL NERVES:  No visual field defects.  Pupils equal round and reactive to light.  Normal conjugate, extra-ocular eye movements in all directions of gaze.  No ptosis.  Face is symmetric. Palate elevates symmetrically.  Tongue is midline.  MOTOR:  Motor strength is 5/5 in all  extremities.  No atrophy, fasciculations or abnormal movements.  No pronator drift.  Tone is normal.    MSRs:  Reflexes are 2+/4 throughout.  SENSORY:  Intact to vibration throughout.  COORDINATION/GAIT:  Normal finger-to- nose-finger.  Intact rapid alternating movements bilaterally.  Gait narrow based and stable. Stressed and tandem gait intact.  Data: NCS/EMG of the left arm 08/24/2020:  normal  MRI brain wwo contrast 09/18/2020: Numerous foci of abnormal T2 and FLAIR signal affecting the cerebral hemispheric white matter. Most extensive involvement is in the deep periventricular white matter, many lesions showing a perpendicular orientation to the lateral ventricles. Patient does have numerous foci of subcortical white matter disease in both hemispheres. Pattern is quite likely to represent demyelinating disease/multiple sclerosis. No lesions show restricted diffusion or contrast enhancement.   MRI cervical spine wwo contrast 10/04/2020: 1. Scattered T2 hyperintense cord lesions, most prominent at C2-3, consistent with demyelinating process. No evidence of cord hemorrhage or abnormal enhancement. 2. No spinal stenosis, nerve root encroachment or disc herniation.  MRI thoracic spine wwo contrast 10/30/2020: 1. Single possible small demyelinating lesion involving the right dorsal cord at the level of T4. No other convincing cord signal changes elsewhere within the thoracic spine. No abnormal enhancement to suggest active demyelination.  2. Otherwise normal MRI of the thoracic spine.      IMPRESSION/PLAN: Relapsing-remitting multiple sclerosis, diagnosed 09/2020. Disease burden involves diffuse subcortical and periventricular white matter, cervical cord (C2-3), and small area at T4.  She was briefly on Vumerity prior to knowing her cervical cord disease, which was stopped due to GI side effects. She will be started Tysabri next month. Clinically stable.  PLAN/RECOMMENDATIONS:  Continue  Tysabri infusions Continue vitamin D 5000IU daily Recommend annual eye exam She will be seeing [facility] in May and can follow-up to see me in September, or sooner as needed  Return to clinic in 3 months.    Thank you for allowing me to participate in patient's care.  If I can answer any additional questions, I would be pleased to do so.    Sincerely,    Yamil Oelke K. Allena Katz, DO

## 2020-12-26 NOTE — Patient Instructions (Addendum)
Recommend annual eye exam  Return to clinic in September 2023

## 2021-02-03 ENCOUNTER — Encounter (HOSPITAL_BASED_OUTPATIENT_CLINIC_OR_DEPARTMENT_OTHER): Payer: Self-pay | Admitting: Emergency Medicine

## 2021-02-03 ENCOUNTER — Emergency Department (HOSPITAL_BASED_OUTPATIENT_CLINIC_OR_DEPARTMENT_OTHER)
Admission: EM | Admit: 2021-02-03 | Discharge: 2021-02-03 | Disposition: A | Discharge status: Home / Self Care | Payer: Medicaid Other | Attending: Emergency Medicine | Admitting: Emergency Medicine

## 2021-02-03 ENCOUNTER — Emergency Department (HOSPITAL_BASED_OUTPATIENT_CLINIC_OR_DEPARTMENT_OTHER): Payer: Medicaid Other

## 2021-02-03 ENCOUNTER — Other Ambulatory Visit: Payer: Self-pay

## 2021-02-03 DIAGNOSIS — J029 Acute pharyngitis, unspecified: Secondary | ICD-10-CM | POA: Diagnosis present

## 2021-02-03 DIAGNOSIS — J45909 Unspecified asthma, uncomplicated: Secondary | ICD-10-CM | POA: Insufficient documentation

## 2021-02-03 DIAGNOSIS — Z87891 Personal history of nicotine dependence: Secondary | ICD-10-CM | POA: Diagnosis not present

## 2021-02-03 DIAGNOSIS — Z79899 Other long term (current) drug therapy: Secondary | ICD-10-CM | POA: Insufficient documentation

## 2021-02-03 DIAGNOSIS — J020 Streptococcal pharyngitis: Secondary | ICD-10-CM | POA: Insufficient documentation

## 2021-02-03 HISTORY — DX: Multiple sclerosis: G35

## 2021-02-03 LAB — CBC WITH DIFFERENTIAL/PLATELET
Abs Immature Granulocytes: 0.07 10*3/uL (ref 0.00–0.07)
Basophils Absolute: 0 10*3/uL (ref 0.0–0.1)
Basophils Relative: 0 %
Eosinophils Absolute: 0.3 10*3/uL (ref 0.0–0.5)
Eosinophils Relative: 3 %
HCT: 35.6 % — ABNORMAL LOW (ref 36.0–46.0)
Hemoglobin: 11.4 g/dL — ABNORMAL LOW (ref 12.0–15.0)
Immature Granulocytes: 1 %
Lymphocytes Relative: 33 %
Lymphs Abs: 3.2 10*3/uL (ref 0.7–4.0)
MCH: 27.1 pg (ref 26.0–34.0)
MCHC: 32 g/dL (ref 30.0–36.0)
MCV: 84.8 fL (ref 80.0–100.0)
Monocytes Absolute: 0.5 10*3/uL (ref 0.1–1.0)
Monocytes Relative: 5 %
Neutro Abs: 5.6 10*3/uL (ref 1.7–7.7)
Neutrophils Relative %: 58 %
Platelets: 257 10*3/uL (ref 150–400)
RBC: 4.2 MIL/uL (ref 3.87–5.11)
RDW: 14.6 % (ref 11.5–15.5)
WBC: 9.8 10*3/uL (ref 4.0–10.5)
nRBC: 1 % — ABNORMAL HIGH (ref 0.0–0.2)

## 2021-02-03 LAB — BASIC METABOLIC PANEL
Anion gap: 7 (ref 5–15)
BUN: 8 mg/dL (ref 6–20)
CO2: 26 mmol/L (ref 22–32)
Calcium: 8.9 mg/dL (ref 8.9–10.3)
Chloride: 101 mmol/L (ref 98–111)
Creatinine, Ser: 0.73 mg/dL (ref 0.44–1.00)
GFR, Estimated: >60 mL/min (ref >60)
Glucose, Bld: 108 mg/dL — ABNORMAL HIGH (ref 70–99)
Potassium: 4.4 mmol/L (ref 3.5–5.1)
Sodium: 134 mmol/L — ABNORMAL LOW (ref 135–145)

## 2021-02-03 LAB — GROUP A STREP BY PCR: Group A Strep by PCR: DETECTED — AB

## 2021-02-03 LAB — HCG, SERUM, QUALITATIVE: Preg, Serum: NEGATIVE

## 2021-02-03 MED ORDER — CEFAZOLIN SODIUM-DEXTROSE 1-4 GM/50ML-% IV SOLN
1.0000 g | Freq: Once | INTRAVENOUS | Status: AC
  Administered 2021-02-03: 05:00:00 1 g via INTRAVENOUS
  Filled 2021-02-03: qty 50

## 2021-02-03 MED ORDER — IOHEXOL 300 MG/ML  SOLN
75.0000 mL | Freq: Once | INTRAMUSCULAR | Status: AC | PRN
  Administered 2021-02-03: 04:00:00 75 mL via INTRAVENOUS

## 2021-02-03 MED ORDER — HYDROCODONE-ACETAMINOPHEN 7.5-325 MG/15ML PO SOLN: 10.0000 mL | ORAL | 0 refills | Status: AC | PRN

## 2021-02-03 MED ORDER — DEXAMETHASONE SODIUM PHOSPHATE 10 MG/ML IJ SOLN
10.0000 mg | Freq: Once | INTRAMUSCULAR | Status: AC
  Administered 2021-02-03: 05:00:00 10 mg via INTRAVENOUS
  Filled 2021-02-03: qty 1

## 2021-02-03 MED ORDER — CEPHALEXIN 250 MG/5ML PO SUSR: 500.0000 mg | Freq: Two times a day (BID) | ORAL | 0 refills | Status: AC

## 2021-02-03 NOTE — ED Triage Notes (Signed)
Pt states throat/neck pain hurts to talk. Right ear pain. Started 2 days ago and worse tonight.

## 2021-02-03 NOTE — ED Provider Notes (Signed)
[facility] Provider Note: Lowella Dell, MD, FACEP  CSN: [id number] MRN: [id number] ARRIVAL: 02/03/21 at 0156 ROOM: [facility]11/[facility]11   CHIEF COMPLAINT  Sore Throat   HISTORY OF PRESENT ILLNESS  02/03/21 3:03 AM Tamara Garza is a 30 y.o. female with 3 days of sore throat radiating into her right ear.  The epicenter of her pain is actually located below the angle of the right mandible.  There is no significant associated swelling.  Pain is worse with talking or swallowing.  She rates her pain as a 9 out of 10.  She has not had a fever with this.   Past Medical History:  Diagnosis Date   ACNE 04/04/2006   Qualifier: Diagnosis of  By: Abundio Miu     Asthma    ASTHMA, INTERMITTENT 04/04/2006   Qualifier: Diagnosis of  By: Lelon Perla MD, [place]     Depression    Gallstones 2011   High risk HPV infection 2009   Infection    UTI   LGSIL (low grade squamous intraepithelial dysplasia)    Migraine 11/21/2011   Multiple sclerosis, primary progressive (HCC)    Obesity    RHINITIS, ALLERGIC 04/04/2006   Qualifier: Diagnosis of  By: Abundio Miu     Vaginal Pap smear, abnormal    ok since colpo    Past Surgical History:  Procedure Laterality Date   CHOLECYSTECTOMY     COLPOSCOPY  2009    Family History  Problem Relation Age of Onset   Multiple sclerosis Mother    Osteoporosis Mother    Diabetes Father    Hypertension Father    Asthma Father    Breast cancer Maternal Grandmother    Cancer Maternal Grandmother    Hypertension Maternal Grandmother    Dementia Maternal Grandfather     Social History   Tobacco Use   Smoking status: Former    Packs/day: 0.25    Types: Cigarettes    Quit date: 2016    Years since quitting: 7.0   Smokeless tobacco: Never  Vaping Use   Vaping Use: Never used  Substance Use Topics   Alcohol use: No   Drug use: Yes    Comment: Patient uses CBD Gummies and Oil for pain    Prior to Admission medications   Medication  Sig Start Date End Date Taking? Authorizing Provider  cephALEXin (KEFLEX) 250 MG/5ML suspension Take 10 mLs (500 mg total) by mouth 2 (two) times daily for 10 days. 02/03/21 02/13/21 Yes China Deitrick, MD  HYDROcodone-acetaminophen (HYCET) 7.5-325 mg/15 ml solution Take 10 mLs by mouth every 4 (four) hours as needed (for pain). 02/03/21  Yes Genavive Kubicki, MD  adapalene (DIFFERIN) 0.1 % gel Apply 1 application topically at bedtime. 07/11/20 07/11/21  [provider]  albuterol (VENTOLIN HFA) 108 (90 Base) MCG/ACT inhaler Inhale 1 puff into the lungs every 6 (six) hours as needed. 07/23/19   [provider]    Allergies Eggs or egg-derived products, Penicillins, Sulfa antibiotics, Multihance [gadobenate], and Sulfonamide derivatives   REVIEW OF SYSTEMS  Negative except as noted here or in the History of Present Illness.   PHYSICAL EXAMINATION  Initial Vital Signs Blood pressure (!) 125/51, pulse 99, temperature 99.2 F (37.3 C), temperature source Oral, resp. rate 18, height 6' (1.829 m), weight 113.4 kg, SpO2 100 %, unknown if currently breastfeeding.  Examination General: Well-developed, well-nourished female in no acute distress; appearance consistent with age of record HENT: normocephalic; atraumatic; posterior pharynx and tonsils are  difficult to visualize; TMs normal; tenderness of upper right neck musculature without palpable mass; no parotid or sublingual gland tenderness Eyes: pupils equal, round and reactive to light; extraocular muscles intact Neck: supple Heart: regular rate and rhythm Lungs: clear to auscultation bilaterally Abdomen: soft; nondistended; nontender; bowel sounds present Extremities: No deformity; full range of motion Neurologic: Awake, alert and oriented; motor function intact in all extremities and symmetric; no facial droop Skin: Warm and dry Psychiatric: Normal mood and affect   RESULTS  Summary of this visit's results, reviewed and  interpreted by myself:   EKG Interpretation  Date/Time:    Ventricular Rate:    PR Interval:    QRS Duration:   QT Interval:    QTC Calculation:   R Axis:     Text Interpretation:         Laboratory Studies: Results for orders placed or performed during the hospital encounter of 02/03/21 (from the past 24 hour(s))  Group A Strep by PCR     Status: Abnormal   Collection Time: 02/03/21  3:14 AM   Specimen: Throat; Sterile Swab  Result Value Ref Range   Group A Strep by PCR DETECTED (A) NOT DETECTED  hCG, serum, qualitative     Status: None   Collection Time: 02/03/21  3:49 AM  Result Value Ref Range   Preg, Serum NEGATIVE NEGATIVE  CBC with Differential/Platelet     Status: Abnormal   Collection Time: 02/03/21  3:49 AM  Result Value Ref Range   WBC 9.8 4.0 - 10.5 K/uL   RBC 4.20 3.87 - 5.11 MIL/uL   Hemoglobin 11.4 (L) 12.0 - 15.0 g/dL   HCT 36.1 (L) 44.3 - 15.4 %   MCV 84.8 80.0 - 100.0 fL   MCH 27.1 26.0 - 34.0 pg   MCHC 32.0 30.0 - 36.0 g/dL   RDW 00.8 67.6 - 19.5 %   Platelets 257 150 - 400 K/uL   nRBC 1.0 (H) 0.0 - 0.2 %   Neutrophils Relative % 58 %   Neutro Abs 5.6 1.7 - 7.7 K/uL   Lymphocytes Relative 33 %   Lymphs Abs 3.2 0.7 - 4.0 K/uL   Monocytes Relative 5 %   Monocytes Absolute 0.5 0.1 - 1.0 K/uL   Eosinophils Relative 3 %   Eosinophils Absolute 0.3 0.0 - 0.5 K/uL   Basophils Relative 0 %   Basophils Absolute 0.0 0.0 - 0.1 K/uL   Immature Granulocytes 1 %   Abs Immature Granulocytes 0.07 0.00 - 0.07 K/uL  Basic metabolic panel     Status: Abnormal   Collection Time: 02/03/21  3:49 AM  Result Value Ref Range   Sodium 134 (L) 135 - 145 mmol/L   Potassium 4.4 3.5 - 5.1 mmol/L   Chloride 101 98 - 111 mmol/L   CO2 26 22 - 32 mmol/L   Glucose, Bld 108 (H) 70 - 99 mg/dL   BUN 8 6 - 20 mg/dL   Creatinine, Ser 0.93 0.44 - 1.00 mg/dL   Calcium 8.9 8.9 - 26.7 mg/dL   GFR, Estimated >12 >45 mL/min   Anion gap 7 5 - 15   Imaging Studies: CT Soft Tissue  Neck W Contrast  Result Date: 02/03/2021 CLINICAL DATA:  Worsening throat and right ear pain for 2 days EXAM: CT NECK WITH CONTRAST TECHNIQUE: Multidetector CT imaging of the neck was performed using the standard protocol following the bolus administration of intravenous contrast. CONTRAST:  48mL OMNIPAQUE IOHEXOL 300 MG/ML  SOLN  COMPARISON:  None. FINDINGS: Pharynx and larynx: Bilateral tonsillar thickening and heterogeneity. Milder adenoid thickening. No abscess or supraglottitis. Salivary glands: No inflammation, mass, or stone. Thyroid: Normal. Lymph nodes: Reactive type homogeneous nodal enlargement in the bilateral upper jugular chains. Vascular: Negative. Limited intracranial: Negative. Visualized orbits: Negative. Mastoids and visualized paranasal sinuses: Clear Skeleton: No acute or aggressive process. Upper chest: Negative. IMPRESSION: Tonsillitis and cervical adenitis without abscess. Electronically Signed   By: Tiburcio Pea M.D.   On: 02/03/2021 04:43    ED COURSE and MDM  Nursing notes, initial and subsequent vitals signs, including pulse oximetry, reviewed and interpreted by myself.  Vitals:   02/03/21 0207 02/03/21 0212  BP: (!) 125/51   Pulse: 99   Resp: 18   Temp: 99.2 F (37.3 C)   TempSrc: Oral   SpO2: 100%   Weight:  113.4 kg  Height:  6' (1.829 m)   Medications  dexamethasone (DECADRON) injection 10 mg (has no administration in time range)  ceFAZolin (ANCEF) IVPB 1 g/50 mL premix (has no administration in time range)  iohexol (OMNIPAQUE) 300 MG/ML solution 75 mL (75 mLs Intravenous Contrast Given 02/03/21 0419)   4:55 AM Patient strep test is positive we will treat with Ancef IV in the ED followed by Keflex at home.  Patient does have an allergy to penicillins (rash).  No evidence of peritonsillar abscess on CT scan.   PROCEDURES  Procedures   ED DIAGNOSES     ICD-10-CM   1. Strep pharyngitis  J02.0          Zyion Leidner, MD 02/03/21 [phone]

## 2021-04-20 ENCOUNTER — Encounter (HOSPITAL_BASED_OUTPATIENT_CLINIC_OR_DEPARTMENT_OTHER): Payer: Self-pay | Admitting: *Deleted

## 2021-04-20 ENCOUNTER — Emergency Department (HOSPITAL_BASED_OUTPATIENT_CLINIC_OR_DEPARTMENT_OTHER)
Admission: EM | Admit: 2021-04-20 | Discharge: 2021-04-21 | Disposition: A | Discharge status: Home / Self Care | Payer: Medicaid Other | Attending: Emergency Medicine | Admitting: Emergency Medicine

## 2021-04-20 ENCOUNTER — Other Ambulatory Visit: Payer: Self-pay

## 2021-04-20 DIAGNOSIS — M25551 Pain in right hip: Secondary | ICD-10-CM | POA: Diagnosis not present

## 2021-04-20 DIAGNOSIS — M549 Dorsalgia, unspecified: Secondary | ICD-10-CM | POA: Diagnosis present

## 2021-04-20 DIAGNOSIS — M79651 Pain in right thigh: Secondary | ICD-10-CM | POA: Diagnosis not present

## 2021-04-20 DIAGNOSIS — M5432 Sciatica, left side: Secondary | ICD-10-CM | POA: Insufficient documentation

## 2021-04-20 NOTE — ED Triage Notes (Signed)
States her sciatic nerve is bothering her. Pain in her hip with radiation down her left leg. Pt is ambulatory with a limp.  ?

## 2021-04-21 MED ORDER — CYCLOBENZAPRINE HCL 10 MG PO TABS: 10.0000 mg | ORAL_TABLET | Freq: Two times a day (BID) | ORAL | 0 refills | Status: AC | PRN

## 2021-04-21 MED ORDER — HYDROCODONE-ACETAMINOPHEN 5-325 MG PO TABS
1.0000 | ORAL_TABLET | Freq: Once | ORAL | Status: AC
  Administered 2021-04-21: 1 via ORAL
  Filled 2021-04-21: qty 1

## 2021-04-21 MED ORDER — CYCLOBENZAPRINE HCL 5 MG PO TABS
5.0000 mg | ORAL_TABLET | Freq: Once | ORAL | Status: AC
  Administered 2021-04-21: 5 mg via ORAL
  Filled 2021-04-21: qty 1

## 2021-04-21 MED ORDER — METHYLPREDNISOLONE 4 MG PO TBPK: ORAL_TABLET | ORAL | 0 refills | Status: AC

## 2021-04-21 NOTE — ED Provider Notes (Signed)
?[facility] EMERGENCY DEPARTMENT ?Provider Note ? ? ?CSN: [id number] ?Arrival date & time: 04/20/21  2150 ? ?  ? ?History ? ?Chief Complaint  ?Patient presents with  ? Back Pain  ? ? ?Tamara Garza is a 31 y.o. female. ? ? ?Back Pain ?Tamara Garza is a 31 y.o. female who presents to the Emergency Department complaining of sciatica flareup.  She complains of pain to the right posterior hip and right posterior thigh that is similar to her prior sciatica pain that she has experienced intermittently for the last 10 years.  Pain started yesterday.  It is sharp and comes and goes.  Is worse with walking, moving.  It is also worse with sitting.  She has tried heat, ice and ibuprofen with no significant improvement in her symptoms.   ? ?No associated fever, abdominal pain and incontinence. ? ?Has MS, takes Tysabri.  No chance of pregnancy.   ?  ? ?Home Medications ?Prior to Admission medications   ?Medication Sig Start Date End Date Taking? Authorizing Provider  ?cyclobenzaprine (FLEXERIL) 10 MG tablet Take 1 tablet (10 mg total) by mouth 2 (two) times daily as needed for muscle spasms. 04/21/21  Yes Tilden Fossa, MD  ?methylPREDNISolone (MEDROL DOSEPAK) 4 MG TBPK tablet Take according to label instructions 04/21/21  Yes Tilden Fossa, MD  ?adapalene (DIFFERIN) 0.1 % gel Apply 1 application topically at bedtime. 07/11/20 07/11/21  [provider]  ?albuterol (VENTOLIN HFA) 108 (90 Base) MCG/ACT inhaler Inhale 1 puff into the lungs every 6 (six) hours as needed. 07/23/19   [provider]  ?HYDROcodone-acetaminophen (HYCET) 7.5-325 mg/15 ml solution Take 10 mLs by mouth every 4 (four) hours as needed (for pain). 02/03/21   Molpus, Jonny Ruiz, MD  ?   ? ?Allergies    ?Eggs or egg-derived products, Penicillins, Sulfa antibiotics, Multihance [gadobenate], and Sulfonamide derivatives   ? ?Review of Systems   ?Review of Systems  ?Musculoskeletal:  Positive for back pain.  ?All other systems reviewed and are  negative. ? ?Physical Exam ?Updated Vital Signs ?BP 122/82 (BP Location: Right Arm)   Pulse 88   Temp 98.4 ?F (36.9 ?C) (Oral)   Resp 18   Ht 5\' 6"  (1.676 m)   Wt 113.4 kg   LMP 03/30/2021   SpO2 99%   BMI 40.35 kg/m?  ?Physical Exam ?Vitals and nursing note reviewed.  ?Constitutional:   ?   Appearance: She is well-developed.  ?HENT:  ?   Head: Normocephalic and atraumatic.  ?Cardiovascular:  ?   Rate and Rhythm: Normal rate and regular rhythm.  ?Pulmonary:  ?   Effort: Pulmonary effort is normal. No respiratory distress.  ?Musculoskeletal:     ?   General: No swelling or tenderness.  ?   Comments: No discrete bony tenderness in the lumbar spine or sacrum.  2+ DP pulses bilaterally.    ?Skin: ?   General: Skin is warm and dry.  ?Neurological:  ?   Mental Status: She is alert and oriented to person, place, and time.  ?   Comments: 5/5 strength in BLE in proximal and distal muscle groups.  Sensation to light touch intact in BLE.  Antalgic gait.    ?Psychiatric:     ?   Behavior: Behavior normal.  ? ? ?ED Results / Procedures / Treatments   ?Labs ?(all labs ordered are listed, but only abnormal results are displayed) ?Labs Reviewed - No data to display ? ?EKG ?None ? ?Radiology ?No results found. ? ?  Procedures ?Procedures  ? ? ?Medications Ordered in ED ?Medications  ?cyclobenzaprine (FLEXERIL) tablet 5 mg (5 mg Oral Given 04/21/21 0104)  ?HYDROcodone-acetaminophen (NORCO/VICODIN) 5-325 MG per tablet 1 tablet (1 tablet Oral Given 04/21/21 0104)  ? ? ?ED Course/ Medical Decision Making/ A&P ?  ?                        ?Medical Decision Making ?Risk ?Prescription drug management. ? ? ?Patient with history of MS, sciatica here for evaluation of right lower posterior hip/thigh pain.  She is neurologically intact and vascularly intact on evaluation.  No historical or exam features concerning for infectious process.  Plan to treat for sciatica flare with oral medications, outpatient follow-up and return  precautions. ? ? ? ? ? ? ? ?Final Clinical Impression(s) / ED Diagnoses ?Final diagnoses:  ?Sciatica of left side  ? ? ?Rx / DC Orders ?ED Discharge Orders   ? ?      Ordered  ?  cyclobenzaprine (FLEXERIL) 10 MG tablet  2 times daily PRN       ? 04/21/21 0054  ?  methylPREDNISolone (MEDROL DOSEPAK) 4 MG TBPK tablet       ? 04/21/21 0057  ? ?  ?  ? ?  ? ? ?  ?Tilden Fossa, MD ?04/21/21 0153 ? ?

## 2021-10-25 NOTE — Progress Notes (Deleted)
Follow-up Visit   Date: 10/25/21   Tamara Garza MRN: [id number] DOB: Aug 07, 1990   Interim History: Tamara Garza is a 31 y.o. left-handed African American female with asthma, depression, and migraines returning to the clinic for follow-up of left arm numbness and abnormal MRI brain.  The patient was accompanied to the clinic by self.   IMPRESSION/PLAN: Relapsing-remitting multiple sclerosis (2022) manifesting with left arm paresthesias.  Imaging with subcoritcal and periventricular white matter lesions, cervical cord (C2-3) and small area at T4.  Clinically, doing well.  - Previously tried:  Vumerity (GI side effects)  - She has been on Tysabri and tolerating this well.    - Continue to follow-up at [facility]   PLAN/RECOMMENDATIONS:  Continue Tysabri infusions Continue vitamin D 5000IU daily Recommend annual eye exam She will be seeing [facility] in May and can follow-up to see me in September, or sooner as needed ***  Return to clinic in 3 months.   History of present illness: She has always had tingling/numbness of the left hand, but in March 2022, she developed numbness/tingling involving the entire left arm.  Symptoms remained constant for a month and then self-resolved.  She occasionally drops her phone.  No neck pain or arm pain. Any movement of the hand would make her pain worse.  No similar symptoms in the right hand. She tried ibuprofen.    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   UPDATE 09/22/2020: She is her for follow-up visit to discuss the results of her MRI brain which shows white matter changes, consistent with demyelinating disease.  She does not have any new neurological complaints today.   No vision changes, numbness, tingling, or weakness.   Her mother was diagnosed with MS at the age of 39 and passed at 88 with pneumonia. She was wheelchair-bound.  Maternal aunt also with MS diagnosed ~ 30s  and currently wheelchair-bound in her 87s.   UPDATE 12/26/2020: She is here for follow-up visit. She saw [facility] who also started PA for Tysabri. She will be started infusion on 12/7 at [facility].  In September/October, she began having numbness/tingling of the left hand which lasted about a week. She had one 15-min episode of blurred vision.  She will follow-up at [facility] in May. No new complaints today. 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   Medications:  Current Outpatient Medications on File Prior to Visit  Medication Sig Dispense Refill   albuterol (VENTOLIN HFA) 108 (90 Base) MCG/ACT inhaler Inhale 1 puff into the lungs every 6 (six) hours as needed.     cyclobenzaprine (FLEXERIL) 10 MG tablet Take 1 tablet (10 mg total) by mouth 2 (two) times daily as needed for muscle spasms. 14 tablet 0   HYDROcodone-acetaminophen (HYCET) 7.5-325 mg/15 ml solution Take 10 mLs by mouth every 4 (four) hours as needed (for pain). 118 mL 0   methylPREDNISolone (MEDROL DOSEPAK) 4 MG TBPK tablet Take according to label instructions 21 each 0   No current facility-administered medications on file prior to visit.    Allergies:  Allergies  Allergen Reactions   Eggs Or Egg-Derived Products Swelling and Itching   Penicillins Rash and Other (See Comments)    Has patient had a PCN reaction causing immediate rash, facial/tongue/throat swelling, SOB or lightheadedness with hypotension: unknown Has patient had a PCN reaction causing severe rash involving mucus membranes or  skin necrosis: unknown Has patient had a PCN reaction that required hospitalization unknown Has patient had a PCN reaction occurring within the last 10 years: no If all of the above answers are "NO", then may proceed with Cephalosporin use.    Sulfa Antibiotics Other (See Comments)   Multihance [Gadobenate] Nausea And Vomiting    Nausea and vomiting after contrast   Sulfonamide  Derivatives Rash    Vital Signs:  There were no vitals taken for this visit.  Neurological Exam: MENTAL STATUS including orientation to time, place, person, recent and remote memory, attention span and concentration, language, and fund of knowledge is normal.  Speech is not dysarthric.  CRANIAL NERVES:  No visual field defects.  Pupils equal round and reactive to light.  Normal conjugate, extra-ocular eye movements in all directions of gaze.  No ptosis.  Face is symmetric. Palate elevates symmetrically.  Tongue is midline.  MOTOR:  Motor strength is 5/5 in all extremities.  No atrophy, fasciculations or abnormal movements.  No pronator drift.  Tone is normal.    MSRs:  Reflexes are 2+/4 throughout.  SENSORY:  Intact to vibration throughout.  COORDINATION/GAIT:  Normal finger-to- nose-finger.  Intact rapid alternating movements bilaterally.  Gait narrow based and stable. Stressed and tandem gait intact.  Data: NCS/EMG of the left arm 08/24/2020:  normal  MRI brain wwo contrast 09/18/2020: Numerous foci of abnormal T2 and FLAIR signal affecting the cerebral hemispheric white matter. Most extensive involvement is in the deep periventricular white matter, many lesions showing a perpendicular orientation to the lateral ventricles. Patient does have numerous foci of subcortical white matter disease in both hemispheres. Pattern is quite likely to represent demyelinating disease/multiple sclerosis. No lesions show restricted diffusion or contrast enhancement.   MRI cervical spine wwo contrast 10/04/2020: 1. Scattered T2 hyperintense cord lesions, most prominent at C2-3, consistent with demyelinating process. No evidence of cord hemorrhage or abnormal enhancement. 2. No spinal stenosis, nerve root encroachment or disc herniation.  MRI thoracic spine wwo contrast 10/30/2020: 1. Single possible small demyelinating lesion involving the right dorsal cord at the level of T4. No other convincing cord  signal changes elsewhere within the thoracic spine. No abnormal enhancement to suggest active demyelination.  2. Otherwise normal MRI of the thoracic spine.        Thank you for allowing me to participate in patient's care.  If I can answer any additional questions, I would be pleased to do so.    Sincerely,    Fizza Scales K. Posey Pronto, DO

## 2021-10-27 ENCOUNTER — Ambulatory Visit: Payer: Medicaid Other | Admitting: Neurology

## 2021-10-27 ENCOUNTER — Encounter: Payer: Self-pay | Admitting: Neurology

## 2021-10-27 DIAGNOSIS — Z029 Encounter for administrative examinations, unspecified: Secondary | ICD-10-CM
